# Patient Record
Sex: Female | Born: 1990
Health system: Southern US, Community
[De-identification: ages and names within clinical notes are randomized; demographics above are authoritative.]

## PROBLEM LIST (undated history)

## (undated) ENCOUNTER — Inpatient Hospital Stay (HOSPITAL_COMMUNITY): Payer: Self-pay

## (undated) DIAGNOSIS — O039 Complete or unspecified spontaneous abortion without complication: Secondary | ICD-10-CM

## (undated) DIAGNOSIS — J45909 Unspecified asthma, uncomplicated: Secondary | ICD-10-CM

## (undated) DIAGNOSIS — O364XX0 Maternal care for intrauterine death, not applicable or unspecified: Secondary | ICD-10-CM

## (undated) DIAGNOSIS — F32A Depression, unspecified: Secondary | ICD-10-CM

## (undated) DIAGNOSIS — F329 Major depressive disorder, single episode, unspecified: Secondary | ICD-10-CM

## (undated) DIAGNOSIS — F419 Anxiety disorder, unspecified: Secondary | ICD-10-CM

## (undated) HISTORY — DX: Complete or unspecified spontaneous abortion without complication: O03.9

## (undated) HISTORY — DX: Unspecified asthma, uncomplicated: J45.909

## (undated) HISTORY — DX: Anxiety disorder, unspecified: F41.9

## (undated) HISTORY — DX: Depression, unspecified: F32.A

---
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
## 2011-07-28 ENCOUNTER — Emergency Department: Payer: Self-pay | Admitting: Unknown Physician Specialty

## 2011-07-28 LAB — CBC
MCH: 28 pg (ref 26.0–34.0)
Platelet: 285 10*3/uL (ref 150–440)
RBC: 4.58 10*6/uL (ref 3.80–5.20)
RDW: 13.3 % (ref 11.5–14.5)

## 2011-07-28 LAB — URINALYSIS, COMPLETE
Bilirubin,UR: NEGATIVE
Glucose,UR: NEGATIVE mg/dL (ref 0–75)
Ph: 5 (ref 4.5–8.0)
RBC,UR: 2 /HPF (ref 0–5)
Squamous Epithelial: 5
WBC UR: 3 /HPF (ref 0–5)

## 2011-07-28 LAB — COMPREHENSIVE METABOLIC PANEL
Albumin: 4.3 g/dL (ref 3.4–5.0)
Alkaline Phosphatase: 65 U/L (ref 50–136)
BUN: 11 mg/dL (ref 7–18)
Calcium, Total: 9.1 mg/dL (ref 8.5–10.1)
Chloride: 107 mmol/L (ref 98–107)
Co2: 22 mmol/L (ref 21–32)
Glucose: 76 mg/dL (ref 65–99)
Osmolality: 272 (ref 275–301)
SGOT(AST): 24 U/L (ref 15–37)

## 2011-07-28 LAB — HCG, QUANTITATIVE, PREGNANCY: Beta Hcg, Quant.: 58011 m[IU]/mL — ABNORMAL HIGH

## 2011-09-06 LAB — OB RESULTS CONSOLE GC/CHLAMYDIA: Chlamydia: NEGATIVE

## 2011-09-06 LAB — OB RESULTS CONSOLE RUBELLA ANTIBODY, IGM: Rubella: IMMUNE

## 2011-09-06 LAB — OB RESULTS CONSOLE HEPATITIS B SURFACE ANTIGEN: Hepatitis B Surface Ag: NEGATIVE

## 2011-09-06 LAB — OB RESULTS CONSOLE RPR
RPR: NONREACTIVE
RPR: NONREACTIVE

## 2011-09-06 LAB — OB RESULTS CONSOLE ANTIBODY SCREEN: Antibody Screen: NEGATIVE

## 2011-09-06 LAB — OB RESULTS CONSOLE HIV ANTIBODY (ROUTINE TESTING): HIV: NONREACTIVE

## 2012-03-04 ENCOUNTER — Encounter (HOSPITAL_COMMUNITY): Payer: Self-pay | Admitting: *Deleted

## 2012-03-04 ENCOUNTER — Inpatient Hospital Stay (HOSPITAL_COMMUNITY)
Admission: AD | Admit: 2012-03-04 | Discharge: 2012-03-04 | Disposition: A | Discharge status: Home / Self Care | Payer: Medicaid Other | Source: Ambulatory Visit | Attending: Obstetrics and Gynecology | Admitting: Obstetrics and Gynecology

## 2012-03-04 ENCOUNTER — Inpatient Hospital Stay (HOSPITAL_COMMUNITY): Payer: Medicaid Other

## 2012-03-04 DIAGNOSIS — O479 False labor, unspecified: Secondary | ICD-10-CM | POA: Insufficient documentation

## 2012-03-04 DIAGNOSIS — O368190 Decreased fetal movements, unspecified trimester, not applicable or unspecified: Secondary | ICD-10-CM | POA: Insufficient documentation

## 2012-03-04 LAB — URINALYSIS, ROUTINE W REFLEX MICROSCOPIC
Bilirubin Urine: NEGATIVE
Ketones, ur: NEGATIVE mg/dL
Leukocytes, UA: NEGATIVE
Nitrite: NEGATIVE
Protein, ur: NEGATIVE mg/dL
pH: 6.5 (ref 5.0–8.0)

## 2012-03-04 LAB — URINE MICROSCOPIC-ADD ON

## 2012-03-04 MED ORDER — ZOLPIDEM TARTRATE 5 MG PO TABS
5.0000 mg | ORAL_TABLET | Freq: Once | ORAL | Status: AC
  Administered 2012-03-04: 5 mg via ORAL
  Filled 2012-03-04: qty 1

## 2012-03-04 MED ORDER — ZOLPIDEM TARTRATE 5 MG PO TABS: 10.0000 mg | ORAL_TABLET | Freq: Once | ORAL | Status: DC

## 2012-03-04 NOTE — MAU Note (Signed)
Pt woke up this am having U/C about 0830 and unable to time frequency.  Pt states she felt little fetal movement and hasn't felt any fetal movement since 1430 when U/C's went away.  No vaginal bleeding or ROM.

## 2012-03-04 NOTE — MAU Provider Note (Signed)
Shantel P Apple is a G43P0  22 y.o. female @ [redacted]w[redacted]d gestation who presents to MAU with contractions. The contractions started at 08:30 and have continued. She also reports decreased fetal movement since this morning. She denies vaginal bleeding or leaking of fluid.   BP 123/68  Pulse 119  Temp 97.6 F (36.4 C) (Oral)  Resp 18  Ht 5\' 7"  (1.702 m)  Wt 217 lb (98.431 kg)  BMI 33.99 kg/m2  SpO2 100%   EFM: baseline 145, moderate variability, not reactive, contractions every 2 to 5 minutes  Dr. Henderson Cloud notified.  Patient sent for [facility]  Medical screening exam complete and patient stable to continue care with Dr. Henderson Cloud.  RN to call Dr. Henderson Cloud with u/s results.

## 2012-03-18 ENCOUNTER — Encounter (HOSPITAL_COMMUNITY): Payer: Self-pay | Admitting: *Deleted

## 2012-03-18 ENCOUNTER — Telehealth (HOSPITAL_COMMUNITY): Payer: Self-pay | Admitting: *Deleted

## 2012-03-18 NOTE — Telephone Encounter (Signed)
Preadmission screen  

## 2012-03-19 ENCOUNTER — Encounter (HOSPITAL_COMMUNITY): Payer: Self-pay | Admitting: *Deleted

## 2012-03-19 ENCOUNTER — Telehealth (HOSPITAL_COMMUNITY): Payer: Self-pay | Admitting: *Deleted

## 2012-03-19 NOTE — Telephone Encounter (Signed)
Preadmission screen  

## 2012-03-23 ENCOUNTER — Inpatient Hospital Stay (HOSPITAL_COMMUNITY)
Admission: RE | Admit: 2012-03-23 | Discharge: 2012-03-27 | DRG: 765 | Disposition: A | Discharge status: Home / Self Care | Payer: Medicaid Other | Source: Ambulatory Visit | Attending: Obstetrics and Gynecology | Admitting: Obstetrics and Gynecology

## 2012-03-23 ENCOUNTER — Encounter (HOSPITAL_COMMUNITY): Payer: Self-pay

## 2012-03-23 VITALS — BP 103/52 | HR 87 | Temp 97.7°F | Resp 18 | Ht 68.0 in | Wt 215.0 lb

## 2012-03-23 DIAGNOSIS — O3660X0 Maternal care for excessive fetal growth, unspecified trimester, not applicable or unspecified: Secondary | ICD-10-CM | POA: Diagnosis present

## 2012-03-23 DIAGNOSIS — Z3480 Encounter for supervision of other normal pregnancy, unspecified trimester: Secondary | ICD-10-CM

## 2012-03-23 DIAGNOSIS — O99892 Other specified diseases and conditions complicating childbirth: Secondary | ICD-10-CM | POA: Diagnosis not present

## 2012-03-23 DIAGNOSIS — R0902 Hypoxemia: Secondary | ICD-10-CM | POA: Diagnosis not present

## 2012-03-23 DIAGNOSIS — R000 Tachycardia, unspecified: Secondary | ICD-10-CM | POA: Diagnosis not present

## 2012-03-23 DIAGNOSIS — O480 Post-term pregnancy: Principal | ICD-10-CM | POA: Diagnosis present

## 2012-03-23 DIAGNOSIS — O411090 Infection of amniotic sac and membranes, unspecified, unspecified trimester, not applicable or unspecified: Secondary | ICD-10-CM | POA: Diagnosis present

## 2012-03-23 LAB — CBC
Hemoglobin: 11.9 g/dL — ABNORMAL LOW (ref 12.0–15.0)
MCH: 28.7 pg (ref 26.0–34.0)
MCHC: 33.8 g/dL (ref 30.0–36.0)
Platelets: 241 10*3/uL (ref 150–400)
RBC: 4.15 MIL/uL (ref 3.87–5.11)

## 2012-03-23 MED ORDER — FLEET ENEMA 7-19 GM/118ML RE ENEM: 1.0000 | ENEMA | RECTAL | Status: DC | PRN

## 2012-03-23 MED ORDER — ACETAMINOPHEN 325 MG PO TABS
650.0000 mg | ORAL_TABLET | ORAL | Status: DC | PRN
  Filled 2012-03-23: qty 2

## 2012-03-23 MED ORDER — TERBUTALINE SULFATE 1 MG/ML IJ SOLN: 0.2500 mg | Freq: Once | INTRAMUSCULAR | Status: AC | PRN

## 2012-03-23 MED ORDER — EPHEDRINE 5 MG/ML INJ: 10.0000 mg | INTRAVENOUS | Status: DC | PRN

## 2012-03-23 MED ORDER — OXYTOCIN 40 UNITS IN LACTATED RINGERS INFUSION - SIMPLE MED: 62.5000 mL/h | INTRAVENOUS | Status: DC

## 2012-03-23 MED ORDER — FENTANYL 2.5 MCG/ML BUPIVACAINE 1/10 % EPIDURAL INFUSION (WH - ANES)
14.0000 mL/h | INTRAMUSCULAR | Status: DC
  Administered 2012-03-24: 14 mL/h via EPIDURAL
  Filled 2012-03-23 (×2): qty 125

## 2012-03-23 MED ORDER — CITRIC ACID-SODIUM CITRATE 334-500 MG/5ML PO SOLN
30.0000 mL | ORAL | Status: DC | PRN
  Administered 2012-03-24: 30 mL via ORAL
  Filled 2012-03-23 (×2): qty 15

## 2012-03-23 MED ORDER — PHENYLEPHRINE 40 MCG/ML (10ML) SYRINGE FOR IV PUSH (FOR BLOOD PRESSURE SUPPORT): 80.0000 ug | PREFILLED_SYRINGE | INTRAVENOUS | Status: DC | PRN

## 2012-03-23 MED ORDER — DIPHENHYDRAMINE HCL 50 MG/ML IJ SOLN: 12.5000 mg | INTRAMUSCULAR | Status: DC | PRN

## 2012-03-23 MED ORDER — EPHEDRINE 5 MG/ML INJ
10.0000 mg | INTRAVENOUS | Status: DC | PRN
  Filled 2012-03-23: qty 4

## 2012-03-23 MED ORDER — ONDANSETRON HCL 4 MG/2ML IJ SOLN: 4.0000 mg | Freq: Four times a day (QID) | INTRAMUSCULAR | Status: DC | PRN

## 2012-03-23 MED ORDER — IBUPROFEN 600 MG PO TABS: 600.0000 mg | ORAL_TABLET | Freq: Four times a day (QID) | ORAL | Status: DC | PRN

## 2012-03-23 MED ORDER — PHENYLEPHRINE 40 MCG/ML (10ML) SYRINGE FOR IV PUSH (FOR BLOOD PRESSURE SUPPORT)
80.0000 ug | PREFILLED_SYRINGE | INTRAVENOUS | Status: DC | PRN
  Filled 2012-03-23: qty 5

## 2012-03-23 MED ORDER — MISOPROSTOL 25 MCG QUARTER TABLET
25.0000 ug | ORAL_TABLET | ORAL | Status: DC | PRN
  Administered 2012-03-23: 25 ug via VAGINAL
  Filled 2012-03-23: qty 0.25

## 2012-03-23 MED ORDER — LACTATED RINGERS IV SOLN
500.0000 mL | Freq: Once | INTRAVENOUS | Status: AC
  Administered 2012-03-23 – 2012-03-24 (×2): 500 mL via INTRAVENOUS

## 2012-03-23 MED ORDER — BUTORPHANOL TARTRATE 1 MG/ML IJ SOLN
1.0000 mg | INTRAMUSCULAR | Status: DC | PRN
  Administered 2012-03-23 – 2012-03-24 (×2): 1 mg via INTRAVENOUS
  Filled 2012-03-23 (×2): qty 1

## 2012-03-23 MED ORDER — OXYTOCIN BOLUS FROM INFUSION: 500.0000 mL | INTRAVENOUS | Status: DC

## 2012-03-23 MED ORDER — OXYCODONE-ACETAMINOPHEN 5-325 MG PO TABS: 1.0000 | ORAL_TABLET | ORAL | Status: DC | PRN

## 2012-03-23 MED ORDER — ZOLPIDEM TARTRATE 5 MG PO TABS: 5.0000 mg | ORAL_TABLET | Freq: Every evening | ORAL | Status: DC | PRN

## 2012-03-23 MED ORDER — LIDOCAINE HCL (PF) 1 % IJ SOLN: 30.0000 mL | INTRAMUSCULAR | Status: DC | PRN

## 2012-03-23 MED ORDER — OXYTOCIN 40 UNITS IN LACTATED RINGERS INFUSION - SIMPLE MED
1.0000 m[IU]/min | INTRAVENOUS | Status: DC
  Administered 2012-03-24: 2 m[IU]/min via INTRAVENOUS
  Filled 2012-03-23: qty 1000

## 2012-03-23 MED ORDER — LACTATED RINGERS IV SOLN
INTRAVENOUS | Status: DC
  Administered 2012-03-23 – 2012-03-24 (×3): via INTRAVENOUS

## 2012-03-23 MED ORDER — LACTATED RINGERS IV SOLN: 500.0000 mL | INTRAVENOUS | Status: DC | PRN

## 2012-03-23 NOTE — H&P (Signed)
22 y.o. G1P0  Estimated Date of Delivery: 03/17/12 admitted at [redacted] weeks gestation for induction.  Prenatal Transfer Tool  Maternal Diabetes: No Genetic Screening: Normal Maternal Ultrasounds/Referrals: Normal Fetal Ultrasounds or other Referrals:  None Maternal Substance Abuse:  No Significant Maternal Medications:  None Significant Maternal Lab Results: None Other Significant Pregnancy Complications:  Post dates  Afebrile, VSS Heart and Lungs: No active disease Abdomen: soft, gravid, EFW AGA. Cervical exam:  1/60 -2 (nurse exam)  Impression: Post date pregnancy  Plan:  Cytotec/pitocin induction

## 2012-03-24 ENCOUNTER — Encounter (HOSPITAL_COMMUNITY): Payer: Self-pay | Admitting: Anesthesiology

## 2012-03-24 ENCOUNTER — Encounter (HOSPITAL_COMMUNITY): Payer: Self-pay

## 2012-03-24 ENCOUNTER — Inpatient Hospital Stay (HOSPITAL_COMMUNITY): Payer: Medicaid Other | Admitting: Anesthesiology

## 2012-03-24 ENCOUNTER — Encounter (HOSPITAL_COMMUNITY)
Admission: RE | Disposition: A | Discharge status: Home / Self Care | Payer: Self-pay | Source: Ambulatory Visit | Attending: Obstetrics and Gynecology

## 2012-03-24 LAB — ABO/RH: ABO/RH(D): O POS

## 2012-03-24 LAB — FIBRINOGEN: Fibrinogen: 620 mg/dL — ABNORMAL HIGH (ref 204–475)

## 2012-03-24 LAB — RPR: RPR Ser Ql: NONREACTIVE

## 2012-03-24 LAB — APTT: aPTT: 33 seconds (ref 24–37)

## 2012-03-24 LAB — TYPE AND SCREEN: Antibody Screen: NEGATIVE

## 2012-03-24 SURGERY — Surgical Case
Anesthesia: Epidural | Site: Abdomen | Wound class: Clean Contaminated

## 2012-03-24 MED ORDER — LACTATED RINGERS IV SOLN
INTRAVENOUS | Status: DC | PRN
  Administered 2012-03-24: 16:00:00 via INTRAVENOUS

## 2012-03-24 MED ORDER — LACTATED RINGERS IV SOLN
INTRAVENOUS | Status: DC
  Administered 2012-03-24: 23:00:00 via INTRAVENOUS

## 2012-03-24 MED ORDER — ONDANSETRON HCL 4 MG/2ML IJ SOLN: 4.0000 mg | INTRAMUSCULAR | Status: DC | PRN

## 2012-03-24 MED ORDER — DIPHENHYDRAMINE HCL 25 MG PO CAPS: 25.0000 mg | ORAL_CAPSULE | Freq: Four times a day (QID) | ORAL | Status: DC | PRN

## 2012-03-24 MED ORDER — SENNOSIDES-DOCUSATE SODIUM 8.6-50 MG PO TABS
2.0000 | ORAL_TABLET | Freq: Every day | ORAL | Status: DC
  Administered 2012-03-24 – 2012-03-25 (×2): 2 via ORAL

## 2012-03-24 MED ORDER — DIPHENHYDRAMINE HCL 50 MG/ML IJ SOLN
INTRAMUSCULAR | Status: AC
  Filled 2012-03-24: qty 1

## 2012-03-24 MED ORDER — SODIUM BICARBONATE 8.4 % IV SOLN
INTRAVENOUS | Status: DC | PRN
  Administered 2012-03-24: 5 mL via EPIDURAL

## 2012-03-24 MED ORDER — SODIUM BICARBONATE 8.4 % IV SOLN
INTRAVENOUS | Status: AC
  Filled 2012-03-24: qty 50

## 2012-03-24 MED ORDER — OXYCODONE-ACETAMINOPHEN 5-325 MG PO TABS
1.0000 | ORAL_TABLET | ORAL | Status: DC | PRN
  Administered 2012-03-25: 1 via ORAL
  Administered 2012-03-26 (×3): 2 via ORAL
  Filled 2012-03-24 (×2): qty 2
  Filled 2012-03-24: qty 1
  Filled 2012-03-24: qty 2

## 2012-03-24 MED ORDER — ONDANSETRON HCL 4 MG/2ML IJ SOLN
INTRAMUSCULAR | Status: AC
  Filled 2012-03-24: qty 2

## 2012-03-24 MED ORDER — DIBUCAINE 1 % RE OINT: 1.0000 "application " | TOPICAL_OINTMENT | RECTAL | Status: DC | PRN

## 2012-03-24 MED ORDER — MORPHINE SULFATE 0.5 MG/ML IJ SOLN
INTRAMUSCULAR | Status: AC
  Filled 2012-03-24: qty 10

## 2012-03-24 MED ORDER — METHYLERGONOVINE MALEATE 0.2 MG/ML IJ SOLN
INTRAMUSCULAR | Status: AC
  Filled 2012-03-24: qty 1

## 2012-03-24 MED ORDER — CEFAZOLIN SODIUM-DEXTROSE 2-3 GM-% IV SOLR: 2.0000 g | Freq: Once | INTRAVENOUS | Status: DC

## 2012-03-24 MED ORDER — IBUPROFEN 600 MG PO TABS
600.0000 mg | ORAL_TABLET | Freq: Four times a day (QID) | ORAL | Status: DC
  Administered 2012-03-25 – 2012-03-27 (×10): 600 mg via ORAL
  Filled 2012-03-24 (×11): qty 1

## 2012-03-24 MED ORDER — ONDANSETRON HCL 4 MG/2ML IJ SOLN
INTRAMUSCULAR | Status: DC | PRN
  Administered 2012-03-24: 4 mg via INTRAVENOUS

## 2012-03-24 MED ORDER — METHYLERGONOVINE MALEATE 0.2 MG/ML IJ SOLN
INTRAMUSCULAR | Status: DC | PRN
  Administered 2012-03-24: 0.2 mg via INTRAMUSCULAR

## 2012-03-24 MED ORDER — OXYTOCIN 40 UNITS IN LACTATED RINGERS INFUSION - SIMPLE MED
INTRAVENOUS | Status: DC | PRN
  Administered 2012-03-24: 40 [IU] via INTRAVENOUS

## 2012-03-24 MED ORDER — PRENATAL MULTIVITAMIN CH
1.0000 | ORAL_TABLET | Freq: Every day | ORAL | Status: DC
  Administered 2012-03-25 – 2012-03-27 (×3): 1 via ORAL
  Filled 2012-03-24 (×3): qty 1

## 2012-03-24 MED ORDER — OXYTOCIN 10 UNIT/ML IJ SOLN
INTRAMUSCULAR | Status: AC
  Filled 2012-03-24: qty 4

## 2012-03-24 MED ORDER — LACTATED RINGERS IV SOLN
INTRAVENOUS | Status: DC | PRN
  Administered 2012-03-24 (×2): via INTRAVENOUS

## 2012-03-24 MED ORDER — CEFAZOLIN SODIUM-DEXTROSE 2-3 GM-% IV SOLR
INTRAVENOUS | Status: AC
  Filled 2012-03-24: qty 50

## 2012-03-24 MED ORDER — MENTHOL 3 MG MT LOZG: 1.0000 | LOZENGE | OROMUCOSAL | Status: DC | PRN

## 2012-03-24 MED ORDER — ZOLPIDEM TARTRATE 5 MG PO TABS: 5.0000 mg | ORAL_TABLET | Freq: Every evening | ORAL | Status: DC | PRN

## 2012-03-24 MED ORDER — SIMETHICONE 80 MG PO CHEW: 80.0000 mg | CHEWABLE_TABLET | ORAL | Status: DC | PRN

## 2012-03-24 MED ORDER — MORPHINE SULFATE (PF) 0.5 MG/ML IJ SOLN
INTRAMUSCULAR | Status: DC | PRN
  Administered 2012-03-24: 1 mg via INTRAVENOUS
  Administered 2012-03-24: 4 mg via EPIDURAL

## 2012-03-24 MED ORDER — SIMETHICONE 80 MG PO CHEW
80.0000 mg | CHEWABLE_TABLET | Freq: Three times a day (TID) | ORAL | Status: DC
  Administered 2012-03-24 – 2012-03-27 (×9): 80 mg via ORAL

## 2012-03-24 MED ORDER — TETANUS-DIPHTH-ACELL PERTUSSIS 5-2.5-18.5 LF-MCG/0.5 IM SUSP
0.5000 mL | Freq: Once | INTRAMUSCULAR | Status: AC
  Administered 2012-03-25: 0.5 mL via INTRAMUSCULAR
  Filled 2012-03-24: qty 0.5

## 2012-03-24 MED ORDER — CEFAZOLIN SODIUM-DEXTROSE 2-3 GM-% IV SOLR
INTRAVENOUS | Status: DC | PRN
  Administered 2012-03-24: 2 g via INTRAVENOUS

## 2012-03-24 MED ORDER — ONDANSETRON HCL 4 MG PO TABS: 4.0000 mg | ORAL_TABLET | ORAL | Status: DC | PRN

## 2012-03-24 MED ORDER — WITCH HAZEL-GLYCERIN EX PADS: 1.0000 "application " | MEDICATED_PAD | CUTANEOUS | Status: DC | PRN

## 2012-03-24 MED ORDER — LANOLIN HYDROUS EX OINT: 1.0000 "application " | TOPICAL_OINTMENT | CUTANEOUS | Status: DC | PRN

## 2012-03-24 MED ORDER — OXYTOCIN 40 UNITS IN LACTATED RINGERS INFUSION - SIMPLE MED: 62.5000 mL/h | INTRAVENOUS | Status: AC

## 2012-03-24 MED ORDER — LIDOCAINE-EPINEPHRINE (PF) 2 %-1:200000 IJ SOLN
INTRAMUSCULAR | Status: AC
  Filled 2012-03-24: qty 20

## 2012-03-24 SURGICAL SUPPLY — 34 items
CLOTH BEACON ORANGE TIMEOUT ST (SAFETY) ×2 IMPLANT
CONTAINER PREFILL 10% NBF 15ML (MISCELLANEOUS) IMPLANT
DRAPE LG THREE QUARTER DISP (DRAPES) ×2 IMPLANT
DRSG OPSITE POSTOP 4X10 (GAUZE/BANDAGES/DRESSINGS) ×2 IMPLANT
DURAPREP 26ML APPLICATOR (WOUND CARE) ×2 IMPLANT
ELECT REM PT RETURN 9FT ADLT (ELECTROSURGICAL) ×2
ELECTRODE REM PT RTRN 9FT ADLT (ELECTROSURGICAL) ×1 IMPLANT
EXTRACTOR VACUUM M CUP 4 TUBE (SUCTIONS) ×2 IMPLANT
GLOVE ECLIPSE 6.0 STRL STRAW (GLOVE) ×4 IMPLANT
GLOVE ECLIPSE 6.5 STRL STRAW (GLOVE) ×4 IMPLANT
GLOVE NEODERM STER SZ 7 (GLOVE) ×4 IMPLANT
GOWN PREVENTION PLUS LG XLONG (DISPOSABLE) ×6 IMPLANT
KIT ABG SYR 3ML LUER SLIP (SYRINGE) IMPLANT
NEEDLE HYPO 25X5/8 SAFETYGLIDE (NEEDLE) ×2 IMPLANT
NS IRRIG 1000ML POUR BTL (IV SOLUTION) ×4 IMPLANT
PACK C SECTION WH (CUSTOM PROCEDURE TRAY) ×2 IMPLANT
PAD OB MATERNITY 4.3X12.25 (PERSONAL CARE ITEMS) ×2 IMPLANT
RTRCTR C-SECT PINK 25CM LRG (MISCELLANEOUS) ×2 IMPLANT
SLEEVE SCD COMPRESS KNEE MED (MISCELLANEOUS) IMPLANT
STAPLER VISISTAT 35W (STAPLE) ×2 IMPLANT
SUT PLAIN 0 NONE (SUTURE) IMPLANT
SUT VIC AB 0 CT1 27 (SUTURE) ×5
SUT VIC AB 0 CT1 27XBRD ANBCTR (SUTURE) ×5 IMPLANT
SUT VIC AB 1 CTX 36 (SUTURE) ×4
SUT VIC AB 1 CTX36XBRD ANBCTRL (SUTURE) ×4 IMPLANT
SUT VIC AB 3-0 CT1 27 (SUTURE) ×2
SUT VIC AB 3-0 CT1 TAPERPNT 27 (SUTURE) ×2 IMPLANT
SUT VIC AB 3-0 PS2 18 (SUTURE)
SUT VIC AB 3-0 PS2 18XBRD (SUTURE) IMPLANT
SUT VIC AB 3-0 SH 27 (SUTURE)
SUT VIC AB 3-0 SH 27X BRD (SUTURE) IMPLANT
TOWEL OR 17X24 6PK STRL BLUE (TOWEL DISPOSABLE) ×6 IMPLANT
TRAY FOLEY CATH 14FR (SET/KITS/TRAYS/PACK) ×2 IMPLANT
WATER STERILE IRR 1000ML POUR (IV SOLUTION) ×2 IMPLANT

## 2012-03-24 NOTE — Progress Notes (Signed)
Platelet count called to dr Jean Rosenthal okay to pull epidural catheter.

## 2012-03-24 NOTE — Op Note (Addendum)
Patient Name: Jill Sandoval MRN: [id number]  Date of Surgery: 03/24/2012    PREOPERATIVE DIAGNOSIS: Maternal hypoxia and tachycardia, fetal tachycardia  POSTOPERATIVE DIAGNOSIS: Same, possible chorioamnionitis   PROCEDURE: Low transverse cesarean section  SURGEON: Caralyn Guile. Arlyce Dice M.D.  ASSISTANT: Antionette Char, M.D.  ANESTHESIA: Epidural  ESTIMATED BLOOD LOSS: 1000 ml  FINDINGS: 8 lb 9 oz Female, Apgar 5,9;  Meconium and foul smelling fluid consistent with chorioamnionitis.  Normal uterus and adnexa.   INDICATIONS: Patient developed an acute episode of sustained maternal tachycardia and mild hypoxia.  Fetal tachycardia developed as well.  Concern was raised about a possible small amniotic fluid embolus.  In view of unexplained and concerning maternal and fetal condition and that the patient was remote from delivery, the decision was made to proceed with cesarean delivery.  PROCEDURE IN DETAIL: The patient was taken to the operating room and the epidural that had been placed in labor was re injected for surgical anesthesia.  She was then placed in the supine position with left lateral displacement of the uterus. The abdomen was prepped and draped in a sterile fashion and the bladder was catheterized.  A low transverse abdominal incision was made and carried down to the fascia. The fascia was opened transversely and the rectus sheath was dissected from the underlying rectus muscle. The rectus midline was identified and opened by sharp and blunt dissection. The peritoneum was opened. An Alexis retractor was placed and the lower uterine segment was identified, entered transversely by careful sharp dissection, and extended bluntly.  A foul odor consistent with chorio was noted.  The infant was delivered with the aid of a vacuum. The placenta was delivered and sent to pathology.  The uterus was bluntly curettaged. The lower segment was closed with running interlocking Vicryl 1 suture.  A  second imbricating Vicryl 1 suture line was placed. The peritoneum and rectus muscle were closed in the midline with running 3-0 Vicryl suture. The fascia was closed with running 0 Vicryl suture and the skin was closed with staples. All sponge and instrument counts were correct.  The patient tolerated the procedure well and left the operating room in good condition.

## 2012-03-24 NOTE — Anesthesia Postprocedure Evaluation (Signed)
  Anesthesia Post Note  Patient: Jill Sandoval  Procedure(s) Performed: Procedure(s) (LRB): CESAREAN SECTION (N/A)  Anesthesia type: Epidural  Patient location: PACU  Post pain: Pain level controlled  Post assessment: Post-op Vital signs reviewed  Last Vitals:  Filed Vitals:   03/24/12 1745  BP: 116/62  Pulse: 101  Temp: 37 C  Resp: 23    Post vital signs: Reviewed  Level of consciousness: awake  Complications: No apparent anesthesia complications

## 2012-03-24 NOTE — Anesthesia Procedure Notes (Signed)

## 2012-03-24 NOTE — Progress Notes (Signed)
Patient developed increased pain, tachycardia, and mild hypoxia.  Baby has developed tachycardia as well.  Cervix is 5.5.  I have a concern that this may represent a small amniotic fluid embolus.  Because of the uncertain maternal status, the fetal tachycardia, possibility of a large baby and remote from full dilation, I have decided to recommend urgent cesarean delivery.  The patient agrees.

## 2012-03-24 NOTE — Anesthesia Preprocedure Evaluation (Signed)
Anesthesia Evaluation  Patient identified by MRN, date of birth, ID band Patient awake    Reviewed: Allergy & Precautions, H&P , Patient's Chart, lab work & pertinent test results  Airway Mallampati: II  TM Distance: >3 FB Neck ROM: full    Dental  (+) Teeth Intact   Pulmonary asthma ,    breath sounds clear to auscultation       Cardiovascular  Rhythm:regular Rate:Normal     Neuro/Psych    GI/Hepatic   Endo/Other    Renal/GU      Musculoskeletal   Abdominal   Peds  Hematology   Anesthesia Other Findings Rare inhaler use       Reproductive/Obstetrics (+) Pregnancy                             Anesthesia Physical Anesthesia Plan  ASA: II  Anesthesia Plan: Epidural   Post-op Pain Management:    Induction:   Airway Management Planned:   Additional Equipment:   Intra-op Plan:   Post-operative Plan:   Informed Consent: I have reviewed the patients History and Physical, chart, labs and discussed the procedure including the risks, benefits and alternatives for the proposed anesthesia with the patient or authorized representative who has indicated his/her understanding and acceptance.   Dental Advisory Given  Plan Discussed with:   Anesthesia Plan Comments: (Labs checked- platelets confirmed with RN in room. Fetal heart tracing, per RN, reported to be stable enough for sitting procedure. Discussed epidural, and patient consents to the procedure:  included risk of possible headache,backache, failed block, allergic reaction, and nerve injury. This patient was asked if she had any questions or concerns before the procedure started.)        Anesthesia Quick Evaluation  

## 2012-03-24 NOTE — Transfer of Care (Signed)
Immediate Anesthesia Transfer of Care Note  Patient: Jill Sandoval  Procedure(s) Performed: Procedure(s): CESAREAN SECTION (N/A)  Patient Location: PACU  Anesthesia Type:Epidural  Level of Consciousness: awake, alert , oriented and patient cooperative  Airway & Oxygen Therapy: Patient Spontanous Breathing  Post-op Assessment: Report given to PACU RN, Post -op Vital signs reviewed and stable and Patient moving all extremities X 4  Post vital signs: Reviewed and stable  Complications: No apparent anesthesia complications

## 2012-03-24 NOTE — Consult Note (Signed)
Neonatology Note:  Attendance at C-section:  I was asked to attend this urgent primary C/S at term due to fetal tachycardia and mother being Rodelo of breath/in resp distress. The mother is a G1P0 O pos, GBS neg with an otherwise uncomplicated pregnancy. ROM 8 hours prior to delivery, fluid with light meconium. Mother was afebrile during labor and received no antibiotics. At delivery, infant was floppy, blue, and poorly perfused, but breathed on his own. He was very foul-smelling. His HR was about 200 and his breathing was somewhat labored, with audible secretions. We bulb suctioned, then used the DeLee and got 20+ ml of purulent, light green mucous from the stomach and throat. We placed a pulse oximeter at about 3-4 minuites of life and it was 45%, so BBO2 was started. The baby's color imrpoved and his perfusion gradually improved, also. We did CPT and DeLee suctioned again for another 4 ml of fluid. The baby was improved, but when we took him to room air (2 tries), he desaturated quickly into the 70s, so BBO2 had to be resumed. The mother viewed the baby briefly, then he was transported to the NICU on BBO2 for further treatment. Ap 5/9. Have asked OB to send placenta for pathology.  Breezie Micucci, MD  

## 2012-03-25 ENCOUNTER — Encounter (HOSPITAL_COMMUNITY): Payer: Self-pay

## 2012-03-25 LAB — CBC
HCT: 26.4 % — ABNORMAL LOW (ref 36.0–46.0)
MCH: 28.4 pg (ref 26.0–34.0)
MCHC: 33.3 g/dL (ref 30.0–36.0)
RDW: 15.1 % (ref 11.5–15.5)

## 2012-03-25 MED ORDER — PNEUMOCOCCAL VAC POLYVALENT 25 MCG/0.5ML IJ INJ
0.5000 mL | INJECTION | INTRAMUSCULAR | Status: AC
  Administered 2012-03-26: 0.5 mL via INTRAMUSCULAR
  Filled 2012-03-25: qty 0.5

## 2012-03-25 NOTE — Anesthesia Postprocedure Evaluation (Signed)
Anesthesia Post Note  Patient: Jill Sandoval  Procedure(s) Performed: Procedure(s) (LRB): CESAREAN SECTION (N/A)  Anesthesia type: Epidural  Patient location: Mother/Baby  Post pain: Pain level controlled  Post assessment: Post-op Vital signs reviewed  Last Vitals:  Filed Vitals:   03/25/12 0502  BP: 94/64  Pulse: 92  Temp:   Resp: 18    Post vital signs: Reviewed  Level of consciousness:alert  Complications: No apparent anesthesia complications

## 2012-03-25 NOTE — Anesthesia Postprocedure Evaluation (Signed)
Anesthesia Post Note  Patient: [name] [name]   Procedure(s) Performed: CLE/C/S  Anesthesia type: Epidural  Patient location: Mother/Baby  Post pain: Pain level controlled  Post assessment: Post-op Vital signs reviewed  Last Vitals: BP 94/64  Pulse 92  Temp(Src) 36.5 C (Oral)  Resp 18  Ht 5\' 8"  (1.727 m)  Wt 215 lb (97.523 kg)  BMI 32.7 kg/m2  SpO2 97%  Post vital signs: Reviewed  Level of consciousness: awake  Complications: No apparent anesthesia complications

## 2012-03-25 NOTE — Progress Notes (Signed)
  Patient is eating, ambulating, voiding.  Pain control is good.  Filed Vitals:   03/24/12 2246 03/25/12 0049 03/25/12 0240 03/25/12 0502  BP: 108/73 103/68 103/68 94/64  Pulse:  94 86 92  Temp:   97.7 F (36.5 C)   TempSrc:   Oral   Resp:  18 18 18   Height:      Weight:      SpO2:  97% 97% 97%    lungs:   clear to auscultation cor:    RRR Abdomen:  soft, appropriate tenderness, incisions intact and without erythema or exudate ex:    no cords   Lab Results  Component Value Date   WBC 19.8* 03/25/2012   HGB 8.8* 03/25/2012   HCT 26.4* 03/25/2012   MCV 85.2 03/25/2012   PLT 184 03/25/2012    --/--/O POS, O POS (02/24 1525)/RI  A/P    Post operative day 1 from LTCS for maternal tachycardia; pt did well o/n with stable vitals.  Routine post op and postpartum care.  Expect d/c tomorrow.  Percocet for pain control.

## 2012-03-25 NOTE — Progress Notes (Signed)
Pt desires circ in office.

## 2012-03-27 MED ORDER — OXYCODONE-ACETAMINOPHEN 5-325 MG PO TABS: 1.0000 | ORAL_TABLET | ORAL | Status: DC | PRN

## 2012-03-27 NOTE — Progress Notes (Signed)
Pt  Out with family and tech  Incisional care reviewed   Ambulated out  Teaching complete

## 2012-03-27 NOTE — Progress Notes (Addendum)
CSW met with MOB and MGM briefly at baby's bedside as they have concerns about FOB.  MOB states he signed the Affidavit of Parentage, but that she has now decided she does not want him on the birth certificate.  CSW referred her to the Birth Registrar to discuss this matter.  She denies any safety concerns and states she has a great support system.  She states no other questions or needs at this time.  CSW will attempt to meet with her at a later time to complete full assessment.

## 2012-03-27 NOTE — Progress Notes (Signed)
POD#3 Pt without complaints.  Baby is doing well. VSSAF Incision is healing well. PLAN/ Will discharge.

## 2012-03-27 NOTE — Progress Notes (Signed)
I visited with Jill Sandoval and her mother, Jill Sandoval, while making rounds on the unit.  They were in good spirits and are coping well with having a baby in the NICU.  This is the first child and grandchild in the family and they are adjusting to their new roles.  Although they live about 40 minutes away, [name] said she would be able to find someone to drive her over so she can visit her son.    Please page as needs arise, [phone].  Agnes Lawrence Muneeb Veras 10:44 AM   03/27/12 1000  Clinical Encounter Type  Visited With Patient and family together  Visit Type Initial

## 2012-03-30 NOTE — Discharge Summary (Signed)
NAMEDESARAI, BARRACK NO.:  [id number]  MEDICAL RECORD NO.:  [id number]  LOCATION:  9320                          FACILITY:  [facility]  PHYSICIAN:  Malva Limes, M.D.    DATE OF BIRTH:  1990/10/24  DATE OF ADMISSION:  03/23/2012 DATE OF DISCHARGE:  03/27/2012                              DISCHARGE SUMMARY   PRINCIPAL DISCHARGE DIAGNOSES: 1. Intrauterine pregnancy at 31 weeks' estimated gestational age. 2. Possible amniotic fluid embolism. 3. Chorioamnionitis.  PRINCIPAL PROCEDURES: 1. Primary low transverse cesarean section. 2. Induction.  HISTORY OF PRESENT ILLNESS:  [name] is a 22 year old, white female, G1, P1, at 17 weeks' estimated gestational age who was admitted for a Cytotec induction secondary to a post term pregnancy.  On admission, the patient's cervix was 60% effaced and 1 cm dilated.  She was given Cytotec on the evening of admission.  The following morning, Pitocin induction was begun.  The patient progressed to 5.5 cm, at which time, the baby developed tachycardia.  The patient also complained of increased pain, tachycardia, and had mild hypoxia.  The attending physicians felt that these changes may represent a small amniotic fluid embolism.  In light of this, she was taken to the operative suite for a primary cesarean section.  During this cesarean section, the foul odor was noted in the uterine cavity, along with meconium.  This was felt to possibly represent chorioamnionitis.  The patient delivered 1 live viable white female infant weighing 8 pounds 9 ounces, the Apgars were 5 at one minute and 9 at five minutes.  For complete description of the procedure, please see the dictated operative note.  The baby was taken care of after delivery via NICU.  Baby was transferred to the NICU for care.  The patient's postop course was benign.  The patient was discharged to home on postoperative day #3, the patient remained afebrile.  Her incision at the  time of discharge was healing well.  Her uterus was nontender.  She appeared to be tolerating her diet.  She did have flatus and was ambulating without difficulty.  Her pain control was adequate.  The patient was discharged to home on postop day #3.  She was sent home with Percocet and Motrin to take as needed.  She will follow up in the office in 2 weeks.          ______________________________ Malva Limes, M.D.     MA/MEDQ  D:  03/30/2012  T:  03/30/2012  Job:  [id number]

## 2013-03-21 IMAGING — US US FETAL BPP W/O NONSTRESS
2 series · 14 of 15 positions shown · non-contrast
Comparison: none

CLINICAL DATA: Decreased fetal movement. By LMP, the patient is 38
weeks 1 day.

[Series 1: us fetal bpp w/o nonstress · non-contrast · 1 of 1 slices shown (1 of 2)]
[im 1/1]
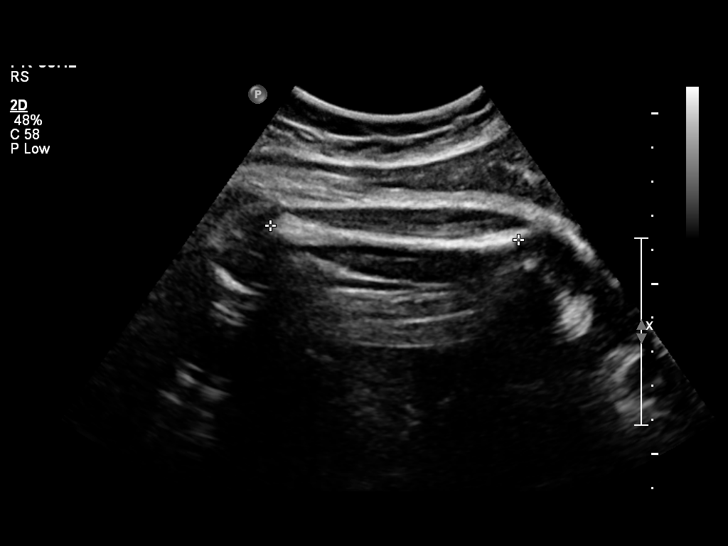

[Series 1: us fetal bpp w/o nonstress · non-contrast · 14 acquisitions, 13 frames shown (2 of 2)]
[im 1/14]
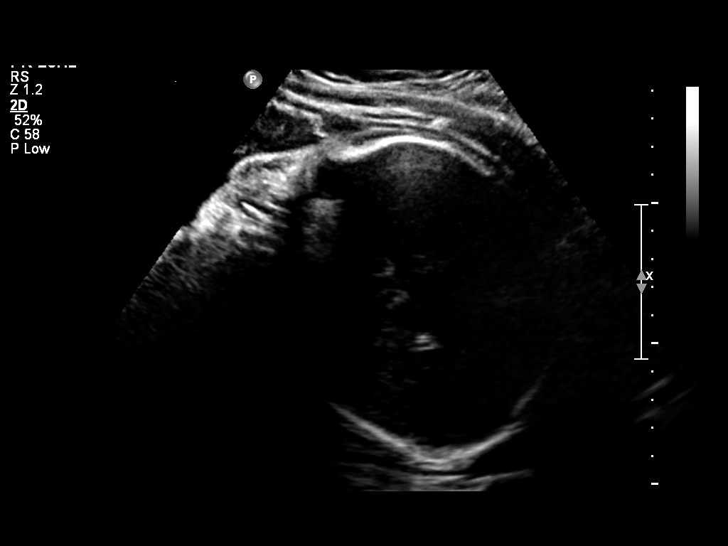
[im 2/14]
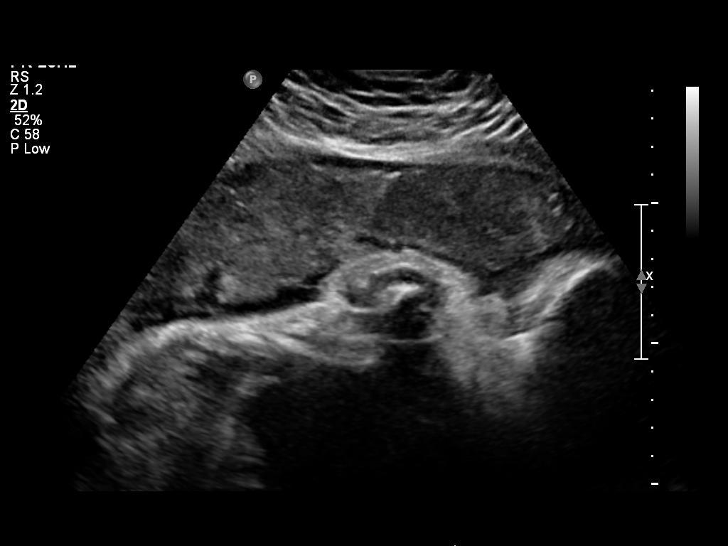
[im 3/14]
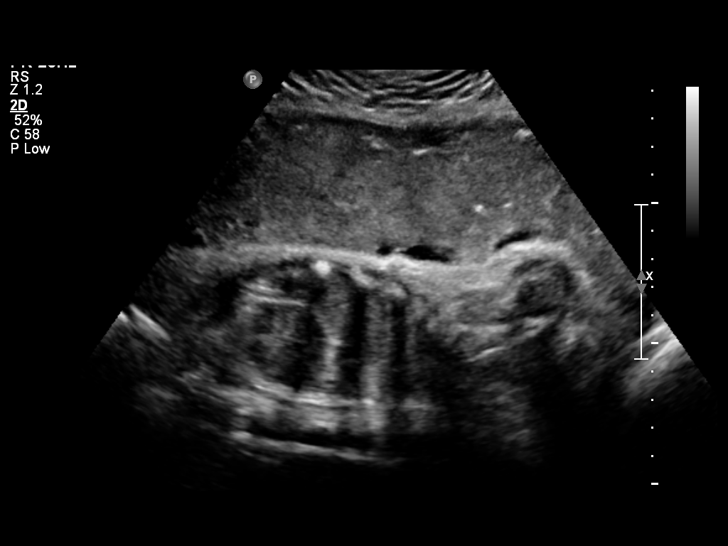
[im 4/14]
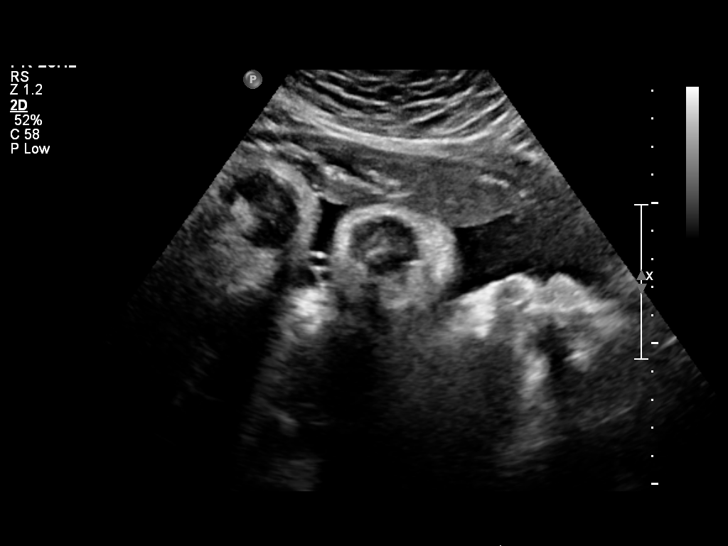
[im 5/14]
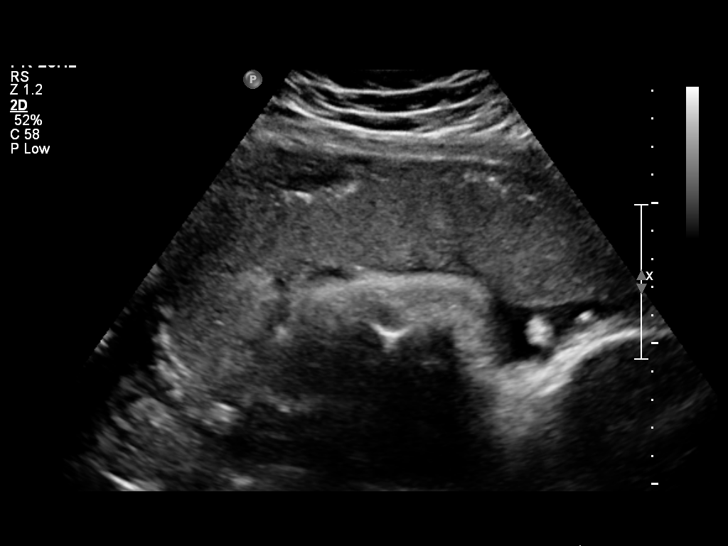
[im 6/14]
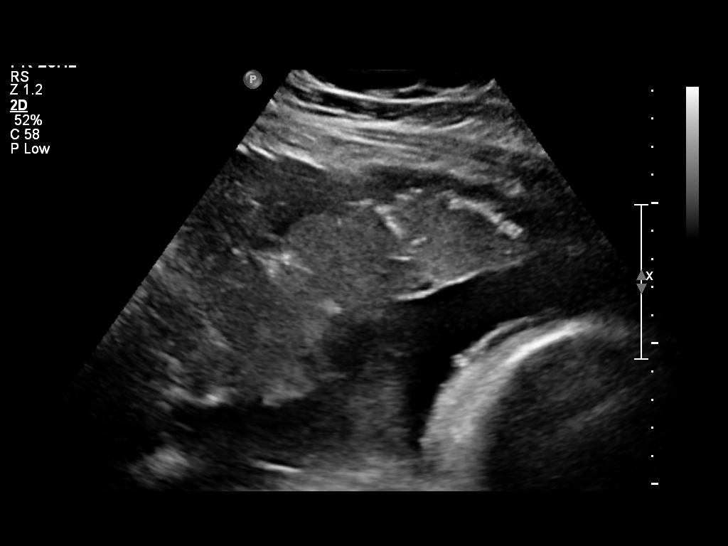
[im 8/14]
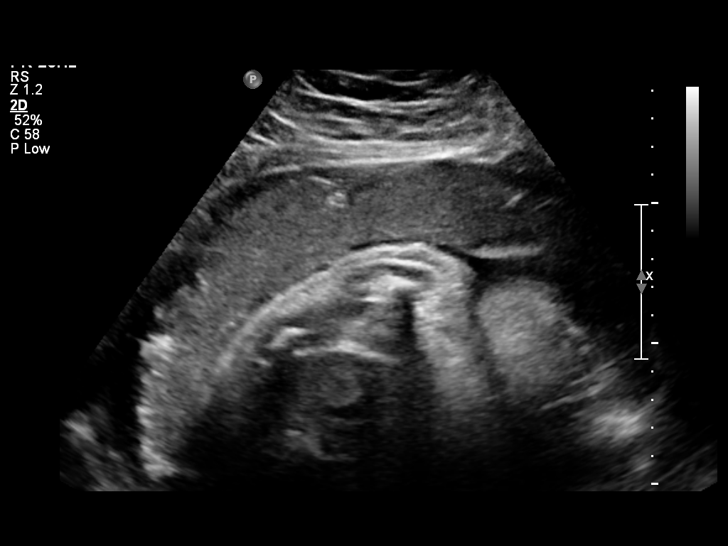
[im 9/14]
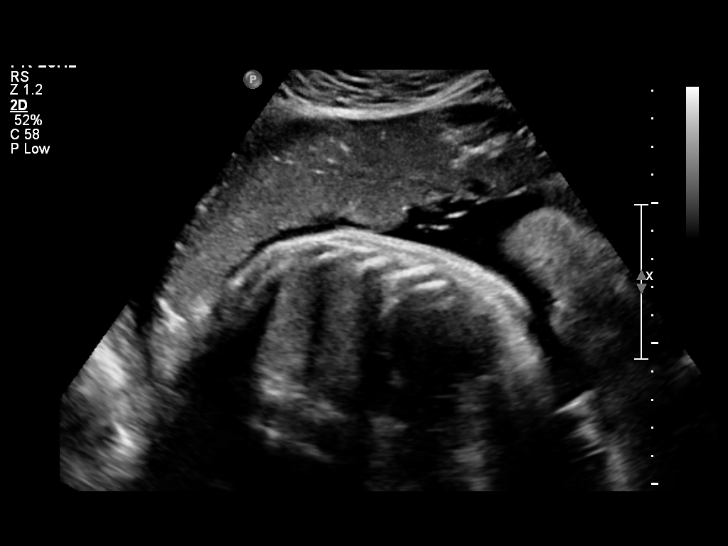
[im 10/14]
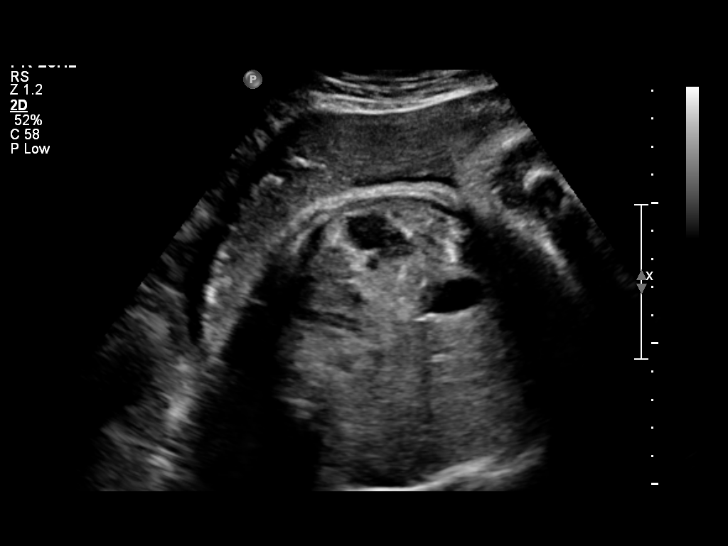
[im 11/14]
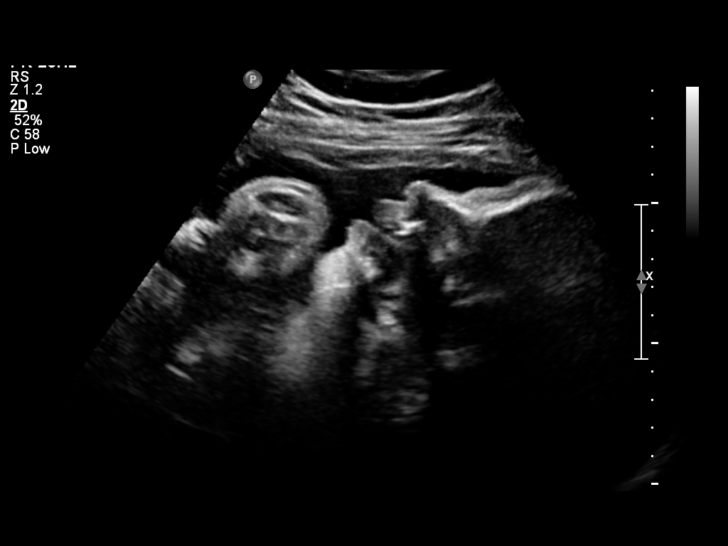
[im 12/14]
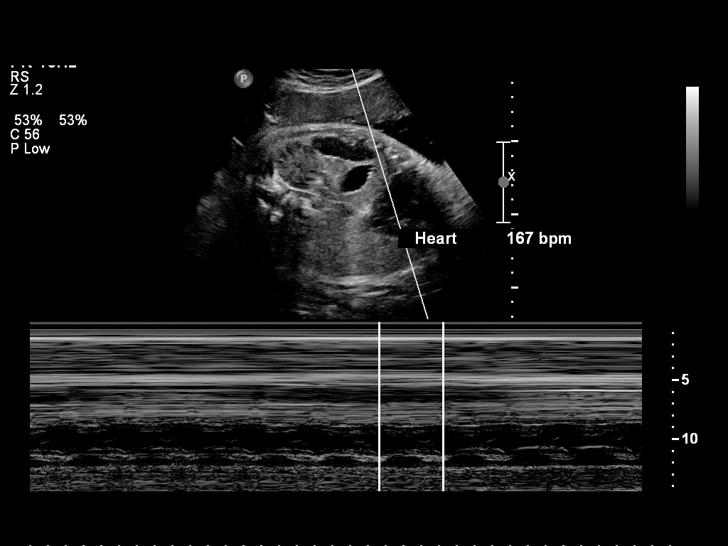
[im 13/14]
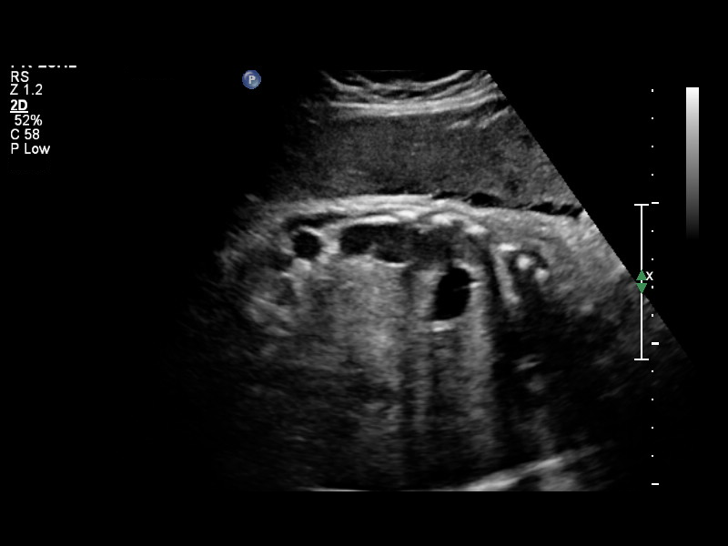
[im 14/14]
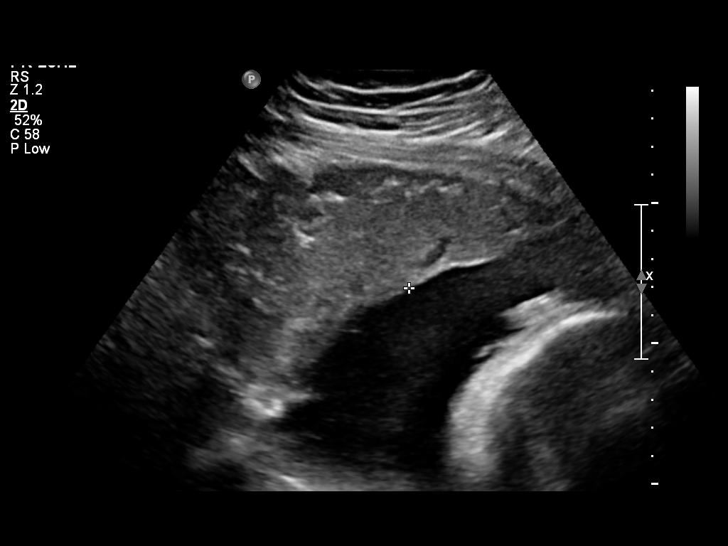

[14 of 15 positions shown; findings below may reference images not displayed]

BIOPHYSICAL PROFILE

Number of Fetuses: 1
Heart Rate: 167  bpm
Presentation: Cephalic
Movement: Present
Placental Location:  Anterior
Previa:  None
Amniotic Fluid (Subjective):  Normal

Vertical pocket:  7.7 cm

FL:  7.26 cm      37 w  2 d

MATERNAL FINDINGS:
Cervix:  Not evaluated /
Uterus/Adnexae:  Ovaries not seen

BPP:
Movement:  2      Time:  6 minutes
Breathing: 2
Tone:   2
Amniotic Fluid:  2

Total Score:  [DATE]
IMPRESSION: 1.  Single living intrauterine fetus in cephalic presentation.
2.  Subjectively normal volume amniotic fluid.
3.  Five distal profile is [DATE].

Recommend followup with non-emergent complete OB 14+ wk US
examination for fetal biometric evaluation and anatomic survey if
not already performed.

## 2013-07-17 ENCOUNTER — Encounter (HOSPITAL_COMMUNITY): Payer: Self-pay | Admitting: Emergency Medicine

## 2013-07-17 ENCOUNTER — Emergency Department (HOSPITAL_COMMUNITY)
Admission: EM | Admit: 2013-07-17 | Discharge: 2013-07-17 | Disposition: A | Discharge status: Home / Self Care | Payer: Managed Care, Other (non HMO) | Attending: Emergency Medicine | Admitting: Emergency Medicine

## 2013-07-17 DIAGNOSIS — Z79899 Other long term (current) drug therapy: Secondary | ICD-10-CM | POA: Insufficient documentation

## 2013-07-17 DIAGNOSIS — W010XXA Fall on same level from slipping, tripping and stumbling without subsequent striking against object, initial encounter: Secondary | ICD-10-CM | POA: Insufficient documentation

## 2013-07-17 DIAGNOSIS — J45909 Unspecified asthma, uncomplicated: Secondary | ICD-10-CM | POA: Insufficient documentation

## 2013-07-17 DIAGNOSIS — Z87891 Personal history of nicotine dependence: Secondary | ICD-10-CM | POA: Insufficient documentation

## 2013-07-17 DIAGNOSIS — S91209A Unspecified open wound of unspecified toe(s) with damage to nail, initial encounter: Secondary | ICD-10-CM

## 2013-07-17 DIAGNOSIS — Y92838 Other recreation area as the place of occurrence of the external cause: Secondary | ICD-10-CM

## 2013-07-17 DIAGNOSIS — S91109A Unspecified open wound of unspecified toe(s) without damage to nail, initial encounter: Secondary | ICD-10-CM | POA: Insufficient documentation

## 2013-07-17 DIAGNOSIS — Y9239 Other specified sports and athletic area as the place of occurrence of the external cause: Secondary | ICD-10-CM | POA: Insufficient documentation

## 2013-07-17 DIAGNOSIS — Y9389 Activity, other specified: Secondary | ICD-10-CM | POA: Insufficient documentation

## 2013-07-17 MED ORDER — BACITRACIN ZINC 500 UNIT/GM EX OINT
TOPICAL_OINTMENT | CUTANEOUS | Status: AC
  Filled 2013-07-17: qty 0.9

## 2013-07-17 MED ORDER — TRAMADOL HCL 50 MG PO TABS: 50.0000 mg | ORAL_TABLET | Freq: Four times a day (QID) | ORAL | Status: DC | PRN

## 2013-07-17 NOTE — Discharge Instructions (Signed)
Fingernail or Toenail Loss All or part of your fingernail or toenail has been lost. This may or may not grow back as a normal nail. A special non-stick bandage has been put on your finger or toe tightly to prevent bleeding. HOME CARE INSTRUCTIONS  The tips of fingers and toes are full of nerves and injuries are often very painful. The following will help you decrease the pain and obtain the best outcome.  Keep your hand or foot elevated above your heart to relieve pain and swelling. This will require lying in bed or on a couch with the hand or leg on pillows or sitting in a recliner with the leg up. Letting your hand or leg dangle may increase swelling, slow healing and cause throbbing pain.  Keep your dressing dry and clean.  Change your bandage in 24 hours after going home.  After your bandage is changed, soak your hand or foot in warm soapy water for 10 to 20 minutes. Do this 3 times per day. This helps reduce pain and swelling. After soaking, apply a clean, dry bandage. Change your bandage if it is wet or dirty.  Only take over-the-counter or prescription medicines for pain, discomfort, or fever as directed by your caregiver.  See your caregiver as needed for problems. SEEK IMMEDIATE MEDICAL CARE IF:   You have increased pain, swelling, drainage, or bleeding.  You have a fever. MAKE SURE YOU:   Understand these instructions.  Will watch your condition.  Will get help right away if you are not doing well or get worse. Document Released: 12/07/2005 Document Revised: 04/09/2011 Document Reviewed: 02/26/2006 Exit[employer] Patient Information 2015 [employer]. This information is not intended to replace advice given to you by your health care provider. Make sure you discuss any questions you have with your health care provider.  

## 2013-07-17 NOTE — ED Provider Notes (Signed)
Medical screening examination/treatment/procedure(s) were performed by non-physician practitioner and as supervising physician I was immediately available for consultation/collaboration.   EKG Interpretation None        Layla MawKristen N Ward, DO 07/17/13 1441

## 2013-07-17 NOTE — ED Notes (Signed)
Toenail on l/first toe torn partway off while in swimming pool today. Bleeding controlled

## 2013-07-17 NOTE — ED Provider Notes (Signed)
CSN: [id number]     Arrival date & time 07/17/13  1243 History  This chart was scribed for Fayrene HelperBowie Zykira Matlack, PA-C, working with Layla MawKristen N Ward, DO, by [facility] Scribe. This patient was seen in room WTR5/WTR5 and the patient's care was started at 1:05 PM.   Chief Complaint  Patient presents with  . Toe Injury    toe nail torn off    The history is provided by the patient. No language interpreter was used.    HPI Comments: Jill Sandoval is a 23 y.o. female who presents to the Emergency Department complaining of a left great toe injury that occurred earlier today. Patient states she injured the left toe after slipping on a ladder in the pool. The toenail was partially removed at the time of injury. She is having constant, moderate pain to the toe that does not radiate to any other areas. She states that she did not sustain any other injuries today. Patient reports her Tetanus vaccinations are UTD.   Past Medical History  Diagnosis Date  . No pertinent past medical history   . Asthma     inhaler prn   Past Surgical History  Procedure Laterality Date  . No past surgeries    . Cesarean section N/A 03/24/2012    Procedure: CESAREAN SECTION;  Surgeon: Mickel Baasichard D Kaplan, MD;  Location: [facility] ORS;  Service: Obstetrics;  Laterality: N/A;   Family History  Problem Relation Age of Onset  . Other Neg Hx   . Hypertension Maternal Grandmother   . Cancer Maternal Grandfather     prostate  . Hearing loss Brother   . Hearing loss Paternal Grandfather    History  Substance Use Topics  . Smoking status: Former Smoker -- 0.25 packs/day    Types: Cigarettes    Quit date: 07/03/2011  . Smokeless tobacco: Never Used  . Alcohol Use: No   OB History   Grav Para Term Preterm Abortions TAB SAB Ect Mult Living   1 1 1       1      Review of Systems  Skin: Positive for wound (left great toe).   Allergies  Review of patient's allergies indicates no known allergies.  Home Medications   Prior  to Admission medications   Medication Sig Start Date End Date Taking? Authorizing Provider  albuterol (PROVENTIL HFA;VENTOLIN HFA) 108 (90 BASE) MCG/ACT inhaler Inhale 2 puffs into the lungs every 6 (six) hours as needed (SOB/Wheezing).    Historical Provider, MD  flintstones complete (FLINTSTONES) 60 MG chewable tablet Chew 1 tablet by mouth daily.    Historical Provider, MD  oxyCODONE-acetaminophen (PERCOCET/ROXICET) 5-325 MG per tablet Take 1-2 tablets by mouth every 4 (four) hours as needed. 03/27/12   Levi AlandMark E Anderson, MD   Triage Vitals: BP 143/65  Pulse 96  Temp(Src) 97.8 F (36.6 C) (Oral)  Resp 16  SpO2 100%  Breastfeeding? No  Physical Exam  Nursing note and vitals reviewed. Constitutional: She is oriented to person, place, and time. She appears well-developed and well-nourished.  HENT:  Head: Normocephalic.  Eyes: EOM are normal.  Neck: Normal range of motion.  Pulmonary/Chest: Effort normal.  Abdominal: She exhibits no distension.  Musculoskeletal: Normal range of motion.  Neurological: She is alert and oriented to person, place, and time.  Skin:  Left great toe: near complete avulsion of toenail with nail bed exposed. No deep laceration. No foreign object. Toe is tender to palpation, but no deformity.  Psychiatric: She  has a normal mood and affect.    ED Course  NAIL REMOVAL Date/Time: 07/17/2013 1:44 PM Performed by: Fayrene HelperRAN, Camela Wich Authorized by: Fayrene HelperRAN, Eliyanna Ault Consent: Verbal consent obtained. Risks and benefits: risks, benefits and alternatives were discussed Consent given by: patient Patient understanding: patient states understanding of the procedure being performed Patient consent: the patient's understanding of the procedure matches consent given Patient identity confirmed: verbally with patient and arm band Time out: Immediately prior to procedure a "time out" was called to verify the correct patient, procedure, equipment, support staff and site/side marked as  required. Location: left foot Location details: left big toe Anesthesia: digital block Local anesthetic: lidocaine 2% without epinephrine Anesthetic total: 5 ml Patient sedated: no Preparation: skin prepped with Betadine Amount removed: complete Wedge excision of skin of nail fold: yes Nail bed sutured: no Nail matrix removed: partial Removed nail replaced and anchored: no Dressing: antibiotic ointment and dressing applied Patient tolerance: Patient tolerated the procedure well with no immediate complications.   (including critical care time)  DIAGNOSTIC STUDIES: Oxygen Saturation is 100% on RA, normal by my interpretation.    COORDINATION OF CARE: 1:09 PM-  Discussed options for wound care and toenail removal. Pt advised of plan for treatment and pt agrees.    Labs Review Labs Reviewed - No data to display  Imaging Review No results found.   EKG Interpretation None      MDM   Final diagnoses:  Traumatic avulsion of nail plate of toe, initial encounter    BP 143/65  Pulse 96  Temp(Src) 97.8 F (36.6 C) (Oral)  Resp 16  SpO2 100%  Breastfeeding? No   I personally performed the services described in this documentation, which was scribed in my presence. The recorded information has been reviewed and is accurate.     Fayrene HelperBowie Nykeria Mealing, PA-C 07/17/13 1345

## 2013-11-30 ENCOUNTER — Encounter (HOSPITAL_COMMUNITY): Payer: Self-pay | Admitting: Emergency Medicine

## 2015-01-30 NOTE — L&D Delivery Note (Signed)
Pt is a 25 yr old white female at 4327 weeks who was admitted for a fetal demise. Pt was admitted and given cytotec. She progressed rapidly. She had a SVD of one dead female infant. The cord was very hyperspiraled causing occlusion of the cord. It appeared that the baby had died 1-2 weeks ago. There were no anatomical abnormalities. Placenta was small and cord as noted above. Delivered intact. No vulvar tears. Pt was offered genetic testing and an autopsy.She declined. Will check Torch Igm titers

## 2015-02-03 ENCOUNTER — Other Ambulatory Visit: Payer: Self-pay | Admitting: Obstetrics & Gynecology

## 2015-02-04 LAB — CYTOLOGY - PAP

## 2015-05-30 ENCOUNTER — Other Ambulatory Visit: Payer: Self-pay | Admitting: Obstetrics and Gynecology

## 2015-06-07 ENCOUNTER — Inpatient Hospital Stay (HOSPITAL_COMMUNITY): Payer: PRIVATE HEALTH INSURANCE

## 2015-06-07 ENCOUNTER — Encounter (HOSPITAL_COMMUNITY): Payer: Self-pay | Admitting: Student

## 2015-06-07 ENCOUNTER — Inpatient Hospital Stay (HOSPITAL_COMMUNITY)
Admission: AD | Admit: 2015-06-07 | Discharge: 2015-06-07 | Disposition: A | Discharge status: Home / Self Care | Payer: PRIVATE HEALTH INSURANCE | Source: Ambulatory Visit | Attending: Obstetrics and Gynecology | Admitting: Obstetrics and Gynecology

## 2015-06-07 DIAGNOSIS — Z87891 Personal history of nicotine dependence: Secondary | ICD-10-CM | POA: Diagnosis not present

## 2015-06-07 DIAGNOSIS — J45909 Unspecified asthma, uncomplicated: Secondary | ICD-10-CM | POA: Insufficient documentation

## 2015-06-07 DIAGNOSIS — O4691 Antepartum hemorrhage, unspecified, first trimester: Secondary | ICD-10-CM

## 2015-06-07 DIAGNOSIS — Z3491 Encounter for supervision of normal pregnancy, unspecified, first trimester: Secondary | ICD-10-CM

## 2015-06-07 DIAGNOSIS — O209 Hemorrhage in early pregnancy, unspecified: Secondary | ICD-10-CM | POA: Insufficient documentation

## 2015-06-07 DIAGNOSIS — Z3A01 Less than 8 weeks gestation of pregnancy: Secondary | ICD-10-CM | POA: Diagnosis not present

## 2015-06-07 DIAGNOSIS — N939 Abnormal uterine and vaginal bleeding, unspecified: Secondary | ICD-10-CM | POA: Diagnosis present

## 2015-06-07 LAB — CBC
HEMATOCRIT: 38 % (ref 36.0–46.0)
HEMOGLOBIN: 12.6 g/dL (ref 12.0–15.0)
MCH: 28.3 pg (ref 26.0–34.0)
MCHC: 33.2 g/dL (ref 30.0–36.0)
MCV: 85.4 fL (ref 78.0–100.0)
Platelets: 290 10*3/uL (ref 150–400)
RBC: 4.45 MIL/uL (ref 3.87–5.11)
RDW: 14.2 % (ref 11.5–15.5)
WBC: 12.4 10*3/uL — AB (ref 4.0–10.5)

## 2015-06-07 LAB — URINALYSIS, ROUTINE W REFLEX MICROSCOPIC
Bilirubin Urine: NEGATIVE
Glucose, UA: NEGATIVE mg/dL
Ketones, ur: NEGATIVE mg/dL
Leukocytes, UA: NEGATIVE
NITRITE: NEGATIVE
PH: 5.5 (ref 5.0–8.0)
Protein, ur: NEGATIVE mg/dL

## 2015-06-07 LAB — URINE MICROSCOPIC-ADD ON

## 2015-06-07 LAB — HCG, QUANTITATIVE, PREGNANCY: hCG, Beta Chain, Quant, S: 39729 m[IU]/mL — ABNORMAL HIGH (ref <5)

## 2015-06-07 LAB — POCT PREGNANCY, URINE: PREG TEST UR: POSITIVE — AB

## 2015-06-07 NOTE — Discharge Instructions (Signed)

## 2015-06-07 NOTE — MAU Note (Signed)
Had some brown blood this am when wiped and now alittle redder. Called office and played phone tag all morning but i need some answers so I came in. No pain.

## 2015-06-07 NOTE — MAU Provider Note (Signed)
History     CSN: [id number]  Arrival date and time: 06/07/15 1144   First Provider Initiated Contact with Patient 06/07/15 1213       Chief Complaint  Patient presents with  . Vaginal Bleeding   HPI  Jill Sandoval is a 25 y.o. G2P1001 at [redacted]w[redacted]d who presents with vaginal bleeding. Reports 2 episodes of vaginal bleeding on toilet paper this morning; first brown, then red. Denies abdominal pain, n/v/d, constipation, dysuria, or vaginal discharge. No intercourse this week.  Was seen in the office last week and has viability ultrasound scheduled on Thursday.   OB History    Gravida Para Term Preterm AB TAB SAB Ectopic Multiple Living   Past Medical History  Diagnosis Date  . Asthma     inhaler prn    Past Surgical History  Procedure Laterality Date  . Cesarean section N/A 03/24/2012    Procedure: CESAREAN SECTION;  Surgeon: Mickel Baas, MD;  Location: [facility];  Service: Obstetrics;  Laterality: N/A;    Family History  Problem Relation Age of Onset  . Other Neg Hx   . Hypertension Maternal Grandmother   . Cancer Maternal Grandfather     prostate  . Hearing loss Brother   . Hearing loss Paternal Grandfather     Social History  Substance Use Topics  . Smoking status: Former Smoker -- 0.25 packs/day    Types: Cigarettes    Quit date: 07/03/2011  . Smokeless tobacco: Never Used  . Alcohol Use: No    Allergies: No Known Allergies  Prescriptions prior to admission  Medication Sig Dispense Refill Last Dose  . albuterol (PROVENTIL HFA;VENTOLIN HFA) 108 (90 BASE) MCG/ACT inhaler Inhale 2 puffs into the lungs every 6 (six) hours as needed (SOB/Wheezing).     Marland Kitchen ibuprofen (ADVIL,MOTRIN) 200 MG tablet Take 200 mg by mouth every 6 (six) hours as needed for headache.   07/16/2013 at Unknown time  . traMADol (ULTRAM) 50 MG tablet Take 1 tablet (50 mg total) by mouth every 6 (six) hours as needed. 15 tablet 0     Review of Systems  Constitutional:  Negative.   Gastrointestinal: Negative.  Negative for abdominal pain.  Genitourinary: Negative for dysuria.       + vaginal bleeding   Physical Exam   Blood pressure 128/72, pulse 93, temperature 98.1 F (36.7 C), resp. rate 20, height  (1.676 m), weight 237 lb 6.4 oz (107.684 kg).  Physical Exam  Nursing note and vitals reviewed. Constitutional: She is oriented to person, place, and time. She appears well-developed and well-nourished. No distress.  HENT:  Head: Normocephalic and atraumatic.  Eyes: Conjunctivae are normal. Right eye exhibits no discharge. Left eye exhibits no discharge. No scleral icterus.  Neck: Normal range of motion.  Cardiovascular: Normal rate.   Respiratory: Effort normal. No respiratory distress.  GI: Soft. She exhibits no distension. There is no tenderness.  Genitourinary: Cervix exhibits no motion tenderness, no discharge and no friability. Vaginal discharge (minimal amount of brown discharge) found.  Cervix closed  Neurological: She is alert and oriented to person, place, and time.  Skin: Skin is warm and dry. She is not diaphoretic.  Psychiatric: She has a normal mood and affect. Her behavior is normal. Judgment and thought content normal.    MAU Course  Procedures Results for orders placed or performed during the hospital encounter of 06/07/15 (from the  past 24 hour(s))  Urinalysis, Routine w reflex microscopic (not at [facility])     Status: Abnormal   Collection Time: 06/07/15 12:07 PM  Result Value Ref Range   Color, Urine YELLOW YELLOW   APPearance HAZY (A) CLEAR   Specific Gravity, Urine >1.030 (H) 1.005 - 1.030   pH 5.5 5.0 - 8.0   Glucose, UA NEGATIVE NEGATIVE mg/dL   Hgb urine dipstick LARGE (A) NEGATIVE   Bilirubin Urine NEGATIVE NEGATIVE   Ketones, ur NEGATIVE NEGATIVE mg/dL   Protein, ur NEGATIVE NEGATIVE mg/dL   Nitrite NEGATIVE NEGATIVE   Leukocytes, UA NEGATIVE NEGATIVE  Urine microscopic-add on     Status: Abnormal   Collection  Time: 06/07/15 12:07 PM  Result Value Ref Range   Squamous Epithelial / LPF 0-5 (A) NONE SEEN   WBC, UA 0-5 0 - 5 WBC/hpf   RBC / HPF 0-5 0 - 5 RBC/hpf   Bacteria, UA FEW (A) NONE SEEN  Pregnancy, urine POC     Status: Abnormal   Collection Time: 06/07/15 12:10 PM  Result Value Ref Range   Preg Test, Ur POSITIVE (A) NEGATIVE  hCG, quantitative, pregnancy     Status: Abnormal   Collection Time: 06/07/15 12:57 PM  Result Value Ref Range   hCG, Beta Chain, Quant, S 39729 (H) <5 mIU/mL  CBC     Status: Abnormal   Collection Time: 06/07/15 12:58 PM  Result Value Ref Range   WBC 12.4 (H) 4.0 - 10.5 K/uL   RBC 4.45 3.87 - 5.11 MIL/uL   Hemoglobin 12.6 12.0 - 15.0 g/dL   HCT 16.1 09.6 - 04.5 %   MCV 85.4 78.0 - 100.0 fL   MCH 28.3 26.0 - 34.0 pg   MCHC 33.2 30.0 - 36.0 g/dL   RDW 40.9 81.1 - 91.4 %   Platelets 290 150 - 400 K/uL   US Ob Comp Less 14 Wks  06/07/2015  CLINICAL DATA:  Vaginal bleeding and first-trimester pregnancy EXAM: OBSTETRIC <14 WK [place] AND TRANSVAGINAL OB US TECHNIQUE: Both transabdominal and transvaginal ultrasound examinations were performed for complete evaluation of the gestation as well as the maternal uterus, adnexal regions, and pelvic cul-de-sac. Transvaginal technique was performed to assess early pregnancy. COMPARISON:  None. FINDINGS: Intrauterine gestational sac: Present and unremarkable Yolk sac:  Present Embryo:  Present Heart Rate:  103 bpm CRL:  2.7  mm   5 w   6 d                  [place] EDC: 02/01/2016 Subchorionic hemorrhage:  None visualized. Maternal uterus/adnexae: The ovaries are not visualized. No adnexal mass. IMPRESSION: Single living intrauterine pregnancy measuring 5 weeks 6 days. No pathologic finding. Electronically Signed   By: Marnee Spring M.D.   On: 06/07/2015 15:20   US Ob Transvaginal  06/07/2015  CLINICAL DATA:  Vaginal bleeding and first-trimester pregnancy EXAM: OBSTETRIC <14 WK [place] AND TRANSVAGINAL OB US TECHNIQUE: Both transabdominal and  transvaginal ultrasound examinations were performed for complete evaluation of the gestation as well as the maternal uterus, adnexal regions, and pelvic cul-de-sac. Transvaginal technique was performed to assess early pregnancy. COMPARISON:  None. FINDINGS: Intrauterine gestational sac: Present and unremarkable Yolk sac:  Present Embryo:  Present Heart Rate:  103 bpm CRL:  2.7  mm   5 w   6 d                  [place] EDC: 02/01/2016 Subchorionic hemorrhage:  None visualized.  Maternal uterus/adnexae: The ovaries are not visualized. No adnexal mass. IMPRESSION: Single living intrauterine pregnancy measuring 5 weeks 6 days. No pathologic finding. Electronically Signed   By: Marnee SpringJonathon  Watts M.D.   On: 06/07/2015 15:20    MDM O positive CBC, BHCG, ultrasound Ultrasound shows SIUP with cardiac activity S/w Dr. Henderson CloudHorvath. Ok to discharge home. Cancel ultrasound appointment in office and follow up for routine prenatal visit   Assessment and Plan  A; 1. Normal IUP (intrauterine pregnancy) on prenatal ultrasound, first trimester   2. Vaginal bleeding in pregnancy, first trimester     P: Discharge home Pelvic rest Cancel ultrasound F/u for ROB Discussed reasons to return  Judeth Hornrin Lawrence 06/07/2015, 12:13 PM

## 2015-06-30 ENCOUNTER — Other Ambulatory Visit: Payer: Self-pay | Admitting: Family Medicine

## 2015-07-01 LAB — CMP12+LP+TP+TSH+6AC+CBC/D/PLT
ALK PHOS: 59 IU/L (ref 39–117)
ALT: 39 IU/L — AB (ref 0–32)
AST: 23 IU/L (ref 0–40)
Albumin/Globulin Ratio: 1.7 (ref 1.2–2.2)
Albumin: 4.2 g/dL (ref 3.5–5.5)
BUN/Creatinine Ratio: 13 (ref 9–23)
BUN: 9 mg/dL (ref 6–20)
Basophils Absolute: 0 10*3/uL (ref 0.0–0.2)
Basos: 0 %
Bilirubin Total: 0.2 mg/dL (ref 0.0–1.2)
CALCIUM: 9.5 mg/dL (ref 8.7–10.2)
CHLORIDE: 101 mmol/L (ref 96–106)
CHOL/HDL RATIO: 3.4 ratio (ref 0.0–4.4)
CREATININE: 0.72 mg/dL (ref 0.57–1.00)
Cholesterol, Total: 194 mg/dL (ref 100–199)
EOS (ABSOLUTE): 0.2 10*3/uL (ref 0.0–0.4)
Eos: 2 %
Estimated CHD Risk: 0.5 times avg. (ref 0.0–1.0)
Free Thyroxine Index: 1.6 (ref 1.2–4.9)
GFR calc Af Amer: 136 mL/min/{1.73_m2} (ref >59)
GFR, EST NON AFRICAN AMERICAN: 118 mL/min/{1.73_m2} (ref >59)
GGT: 16 IU/L (ref 0–60)
GLUCOSE: 101 mg/dL — AB (ref 65–99)
Globulin, Total: 2.5 g/dL (ref 1.5–4.5)
HDL: 57 mg/dL (ref >39)
HEMATOCRIT: 38.1 % (ref 34.0–46.6)
Hemoglobin: 12.6 g/dL (ref 11.1–15.9)
Immature Grans (Abs): 0 10*3/uL (ref 0.0–0.1)
Immature Granulocytes: 0 %
Iron: 51 ug/dL (ref 27–159)
LDH: 198 IU/L (ref 119–226)
LDL CALC: 103 mg/dL — AB (ref 0–99)
LYMPHS ABS: 2.4 10*3/uL (ref 0.7–3.1)
LYMPHS: 25 %
MCH: 27.7 pg (ref 26.6–33.0)
MCHC: 33.1 g/dL (ref 31.5–35.7)
MCV: 84 fL (ref 79–97)
MONOS ABS: 0.4 10*3/uL (ref 0.1–0.9)
Monocytes: 4 %
NEUTROS ABS: 6.6 10*3/uL (ref 1.4–7.0)
Neutrophils: 69 %
PHOSPHORUS: 3.2 mg/dL (ref 2.5–4.5)
POTASSIUM: 4.1 mmol/L (ref 3.5–5.2)
Platelets: 320 10*3/uL (ref 150–379)
RBC: 4.55 x10E6/uL (ref 3.77–5.28)
RDW: 13.9 % (ref 12.3–15.4)
Sodium: 136 mmol/L (ref 134–144)
T3 Uptake Ratio: 17 % — ABNORMAL LOW (ref 24–39)
T4 TOTAL: 9.5 ug/dL (ref 4.5–12.0)
TSH: 1.27 u[IU]/mL (ref 0.450–4.500)
Total Protein: 6.7 g/dL (ref 6.0–8.5)
Triglycerides: 168 mg/dL — ABNORMAL HIGH (ref 0–149)
URIC ACID: 1.4 mg/dL — AB (ref 2.5–7.1)
VLDL Cholesterol Cal: 34 mg/dL (ref 5–40)
WBC: 9.6 10*3/uL (ref 3.4–10.8)

## 2015-07-01 LAB — HGB A1C W/O EAG: HEMOGLOBIN A1C: 5 % (ref 4.8–5.6)

## 2015-09-10 ENCOUNTER — Inpatient Hospital Stay (HOSPITAL_COMMUNITY): Payer: PRIVATE HEALTH INSURANCE

## 2015-09-10 ENCOUNTER — Encounter (HOSPITAL_COMMUNITY): Payer: Self-pay

## 2015-09-10 ENCOUNTER — Inpatient Hospital Stay (HOSPITAL_COMMUNITY)
Admission: AD | Admit: 2015-09-10 | Discharge: 2015-09-10 | Disposition: A | Discharge status: Home / Self Care | Payer: PRIVATE HEALTH INSURANCE | Source: Ambulatory Visit | Attending: Obstetrics and Gynecology | Admitting: Obstetrics and Gynecology

## 2015-09-10 DIAGNOSIS — O468X2 Other antepartum hemorrhage, second trimester: Secondary | ICD-10-CM | POA: Diagnosis not present

## 2015-09-10 DIAGNOSIS — Z79899 Other long term (current) drug therapy: Secondary | ICD-10-CM | POA: Insufficient documentation

## 2015-09-10 DIAGNOSIS — O418X20 Other specified disorders of amniotic fluid and membranes, second trimester, not applicable or unspecified: Secondary | ICD-10-CM

## 2015-09-10 DIAGNOSIS — Z87891 Personal history of nicotine dependence: Secondary | ICD-10-CM | POA: Insufficient documentation

## 2015-09-10 DIAGNOSIS — O4690 Antepartum hemorrhage, unspecified, unspecified trimester: Secondary | ICD-10-CM

## 2015-09-10 DIAGNOSIS — Z9889 Other specified postprocedural states: Secondary | ICD-10-CM | POA: Insufficient documentation

## 2015-09-10 DIAGNOSIS — Z3A19 19 weeks gestation of pregnancy: Secondary | ICD-10-CM | POA: Insufficient documentation

## 2015-09-10 DIAGNOSIS — O99512 Diseases of the respiratory system complicating pregnancy, second trimester: Secondary | ICD-10-CM | POA: Diagnosis not present

## 2015-09-10 DIAGNOSIS — O4692 Antepartum hemorrhage, unspecified, second trimester: Secondary | ICD-10-CM

## 2015-09-10 DIAGNOSIS — Z6790 Unspecified blood type, Rh positive: Secondary | ICD-10-CM | POA: Diagnosis not present

## 2015-09-10 LAB — CBC
HCT: 35 % — ABNORMAL LOW (ref 36.0–46.0)
HEMOGLOBIN: 11.7 g/dL — AB (ref 12.0–15.0)
MCH: 27.7 pg (ref 26.0–34.0)
MCHC: 33.4 g/dL (ref 30.0–36.0)
MCV: 82.9 fL (ref 78.0–100.0)
PLATELETS: 279 10*3/uL (ref 150–400)
RBC: 4.22 MIL/uL (ref 3.87–5.11)
RDW: 13.8 % (ref 11.5–15.5)
WBC: 14.3 10*3/uL — AB (ref 4.0–10.5)

## 2015-09-10 LAB — RAPID URINE DRUG SCREEN, HOSP PERFORMED
Amphetamines: NOT DETECTED
BARBITURATES: NOT DETECTED
Benzodiazepines: NOT DETECTED
COCAINE: NOT DETECTED
Opiates: NOT DETECTED
TETRAHYDROCANNABINOL: NOT DETECTED

## 2015-09-10 NOTE — MAU Provider Note (Signed)
History     CSN: [id number]  Arrival date and time: 09/10/15 0038   None     Chief Complaint  Patient presents with  . Vaginal Bleeding   G2P1001 @19 .3 wks c/o acute onset of bright red vaginal bleeding about 2 hours ago. She reports that she got up and felt wet and found a large amt of blood soaking underwear. She saturated 1-2 a pad over 30 minutes. She denies pain, cramping, or ctx. No recent IC. No recent falls or injury. Denies drug or tobacco use. Pregnancy has been complicated by first trimester bleeding. Pt also reports a "tear" noted on last ultrasound, could not elaborate, was told not concerning. No prenatal records on file.     OB History    Gravida Para Term Preterm AB Living   2 1 1     1    SAB TAB Ectopic Multiple Live Births           1      Past Medical History:  Diagnosis Date  . Asthma    inhaler prn    Past Surgical History:  Procedure Laterality Date  . CESAREAN SECTION N/A 03/24/2012   Procedure: CESAREAN SECTION;  Surgeon: Mickel Baasichard D Kaplan, MD;  Location: [facility];  Service: Obstetrics;  Laterality: N/A;    Family History  Problem Relation Age of Onset  . Hypertension Maternal Grandmother   . Cancer Maternal Grandfather     prostate  . Hearing loss Brother   . Hearing loss Paternal Grandfather   . Other Neg Hx     Social History  Substance Use Topics  . Smoking status: Former Smoker    Packs/day: 0.25    Types: Cigarettes    Quit date: 07/03/2011  . Smokeless tobacco: Never Used  . Alcohol use No    Allergies: No Known Allergies  Prescriptions Prior to Admission  Medication Sig Dispense Refill Last Dose  . Prenatal Vit-Fe Fumarate-FA (PRENATAL MULTIVITAMIN) TABS tablet Take 1 tablet by mouth daily at 12 noon.   Past Week at Unknown time  . albuterol (PROVENTIL HFA;VENTOLIN HFA) 108 (90 BASE) MCG/ACT inhaler Inhale 2 puffs into the lungs every 6 (six) hours as needed (SOB/Wheezing).   rescue  . loratadine (CLARITIN) 10 MG tablet Take 10  mg by mouth daily as needed for allergies.   06/06/2015 at Unknown time  . ondansetron (ZOFRAN) 8 MG tablet Take 8 mg by mouth every 8 (eight) hours as needed for nausea or vomiting.   06/04/2015    Review of Systems  Constitutional: Negative.   Gastrointestinal: Negative.   Genitourinary: Negative.    Physical Exam   Blood pressure 121/56, pulse 87, temperature 98.2 F (36.8 C), temperature source Oral, resp. rate 20, SpO2 100 %.  Physical Exam  Constitutional: She is oriented to person, place, and time. She appears well-developed and well-nourished.  HENT:  Head: Normocephalic and atraumatic.  Neck: Normal range of motion.  Cardiovascular: Normal rate.   Respiratory: Effort normal.  GI: Soft. She exhibits no distension. There is no tenderness.  gravid  Genitourinary:  Genitourinary Comments: External: no lesions Vagina: rugated, parous, large amount bright red blood in culdesac, no active bleeding, mucous present at os, visually closed SVE: closed/long   Musculoskeletal: Normal range of motion.  Neurological: She is alert and oriented to person, place, and time.  Skin: Skin is warm and dry.  Psychiatric: She has a normal mood and affect.   FHT: 145 bpm Results for orders placed or  performed during the hospital encounter of 09/10/15 (from the past 24 hour(s))  Urine rapid drug screen (hosp performed)     Status: None   Collection Time: 09/10/15  1:30 AM  Result Value Ref Range   Opiates NONE DETECTED NONE DETECTED   Cocaine NONE DETECTED NONE DETECTED   Benzodiazepines NONE DETECTED NONE DETECTED   Amphetamines NONE DETECTED NONE DETECTED   Tetrahydrocannabinol NONE DETECTED NONE DETECTED   Barbiturates NONE DETECTED NONE DETECTED  CBC     Status: Abnormal   Collection Time: 09/10/15  1:31 AM  Result Value Ref Range   WBC 14.3 (H) 4.0 - 10.5 K/uL   RBC 4.22 3.87 - 5.11 MIL/uL   Hemoglobin 11.7 (L) 12.0 - 15.0 g/dL   HCT 53.6 (L) 64.4 - 03.4 %   MCV 82.9 78.0 - 100.0 fL    MCH 27.7 26.0 - 34.0 pg   MCHC 33.4 30.0 - 36.0 g/dL   RDW 74.2 59.5 - 63.8 %   Platelets 279 150 - 400 K/uL   [place]: FHR 142, placenta above os, AFV nml, left anterior [facility] 4cmx2cmx2cm  MAU Course  Procedures  MDM Labs ordered and reviewed. Bleeding tapered to light pink. No evidence of previa, abruption, or PTL. Findings discussed with Dr. Dareen Piano. Stable for discharge home.  Assessment and Plan   1. Subchorionic hemorrhage, second trimester   2. Vaginal bleeding in pregnancy   3. Rh(D) positive    Discharge home Bleeding precautions Follow up as scheduled in office in 3 days Return for worsening sx  Donette Larry, CNM 09/10/2015, 1:23 AM

## 2015-09-10 NOTE — MAU Note (Signed)
Vaginal bleeding started at 11pm. Front of underwear was soaked and in commode.  No blood clots.  Last sex was month ago.  No pain.

## 2015-09-10 NOTE — Discharge Instructions (Signed)
Subchorionic Hematoma °A subchorionic hematoma is a gathering of blood between the outer wall of the placenta and the inner wall of the womb (uterus). The placenta is the organ that connects the fetus to the wall of the uterus. The placenta performs the feeding, breathing (oxygen to the fetus), and waste removal (excretory work) of the fetus.  °Subchorionic hematoma is the most common abnormality found on a result from ultrasonography done during the first trimester or early second trimester of pregnancy. If there has been little or no vaginal bleeding, early small hematomas usually shrink on their own and do not affect your baby or pregnancy. The blood is gradually absorbed over 1-2 weeks. When bleeding starts later in pregnancy or the hematoma is larger or occurs in an older pregnant woman, the outcome may not be as good. Larger hematomas may get bigger, which increases the chances for miscarriage. Subchorionic hematoma also increases the risk of premature detachment of the placenta from the uterus, preterm (premature) labor, and stillbirth. °HOME CARE INSTRUCTIONS °· Stay on bed rest if your health care provider recommends this. Although bed rest will not prevent more bleeding or prevent a miscarriage, your health care provider may recommend bed rest until you are advised otherwise. °· Avoid heavy lifting (more than 10 lb [4.5 kg]), exercise, sexual intercourse, or douching as directed by your health care provider. °· Keep track of the number of pads you use each day and how soaked (saturated) they are. Write down this information. °· Do not use tampons. °· Keep all follow-up appointments as directed by your health care provider. Your health care provider may ask you to have follow-up blood tests or ultrasound tests or both. °SEEK IMMEDIATE MEDICAL CARE IF: °· You have severe cramps in your stomach, back, abdomen, or pelvis. °· You have a fever. °· You pass large clots or tissue. Save any tissue for your health  care provider to look at. °· Your bleeding increases or you become lightheaded, feel weak, or have fainting episodes. °  °This information is not intended to replace advice given to you by your health care provider. Make sure you discuss any questions you have with your health care provider. °  °Document Released: 05/02/2006 Document Revised: 02/05/2014 Document Reviewed: 08/14/2012 °Elsevier Interactive Patient Education ©2016 [employer] Inc[employer] ° °

## 2015-11-03 ENCOUNTER — Inpatient Hospital Stay (HOSPITAL_COMMUNITY)
Admission: AD | Admit: 2015-11-03 | Discharge: 2015-11-05 | DRG: 775 | Disposition: A | Discharge status: Home / Self Care | Payer: Medicaid Other | Source: Ambulatory Visit | Attending: Obstetrics and Gynecology | Admitting: Obstetrics and Gynecology

## 2015-11-03 ENCOUNTER — Encounter (HOSPITAL_COMMUNITY): Payer: Self-pay | Admitting: *Deleted

## 2015-11-03 ENCOUNTER — Inpatient Hospital Stay (HOSPITAL_COMMUNITY): Payer: Medicaid Other

## 2015-11-03 DIAGNOSIS — Z87891 Personal history of nicotine dependence: Secondary | ICD-10-CM

## 2015-11-03 DIAGNOSIS — O34211 Maternal care for low transverse scar from previous cesarean delivery: Secondary | ICD-10-CM | POA: Diagnosis present

## 2015-11-03 DIAGNOSIS — Z8759 Personal history of other complications of pregnancy, childbirth and the puerperium: Secondary | ICD-10-CM | POA: Insufficient documentation

## 2015-11-03 DIAGNOSIS — O9952 Diseases of the respiratory system complicating childbirth: Secondary | ICD-10-CM | POA: Diagnosis present

## 2015-11-03 DIAGNOSIS — J45909 Unspecified asthma, uncomplicated: Secondary | ICD-10-CM | POA: Diagnosis present

## 2015-11-03 DIAGNOSIS — Z3A27 27 weeks gestation of pregnancy: Secondary | ICD-10-CM

## 2015-11-03 DIAGNOSIS — IMO0002 Reserved for concepts with insufficient information to code with codable children: Secondary | ICD-10-CM

## 2015-11-03 DIAGNOSIS — O364XX0 Maternal care for intrauterine death, not applicable or unspecified: Principal | ICD-10-CM | POA: Diagnosis present

## 2015-11-03 LAB — CBC
HCT: 39 % (ref 36.0–46.0)
Hemoglobin: 13.3 g/dL (ref 12.0–15.0)
MCH: 28.3 pg (ref 26.0–34.0)
MCHC: 34.1 g/dL (ref 30.0–36.0)
MCV: 83 fL (ref 78.0–100.0)
PLATELETS: 299 10*3/uL (ref 150–400)
RBC: 4.7 MIL/uL (ref 3.87–5.11)
RDW: 14.8 % (ref 11.5–15.5)
WBC: 13.5 10*3/uL — AB (ref 4.0–10.5)

## 2015-11-03 LAB — TYPE AND SCREEN
ABO/RH(D): O POS
Antibody Screen: NEGATIVE

## 2015-11-03 MED ORDER — LIDOCAINE HCL (PF) 1 % IJ SOLN
30.0000 mL | INTRAMUSCULAR | Status: DC | PRN
  Filled 2015-11-03: qty 30

## 2015-11-03 MED ORDER — LACTATED RINGERS IV SOLN: 500.0000 mL | INTRAVENOUS | Status: DC | PRN

## 2015-11-03 MED ORDER — ACETAMINOPHEN 325 MG PO TABS
650.0000 mg | ORAL_TABLET | ORAL | Status: DC | PRN
  Administered 2015-11-03: 650 mg via ORAL
  Filled 2015-11-03: qty 2

## 2015-11-03 MED ORDER — OXYCODONE-ACETAMINOPHEN 5-325 MG PO TABS
2.0000 | ORAL_TABLET | ORAL | Status: DC | PRN
  Administered 2015-11-04: 2 via ORAL
  Filled 2015-11-03: qty 2

## 2015-11-03 MED ORDER — SOD CITRATE-CITRIC ACID 500-334 MG/5ML PO SOLN: 30.0000 mL | ORAL | Status: DC | PRN

## 2015-11-03 MED ORDER — ONDANSETRON HCL 4 MG/2ML IJ SOLN: 4.0000 mg | Freq: Four times a day (QID) | INTRAMUSCULAR | Status: DC | PRN

## 2015-11-03 MED ORDER — OXYTOCIN BOLUS FROM INFUSION
500.0000 mL | Freq: Once | INTRAVENOUS | Status: AC
  Administered 2015-11-04: 500 mL via INTRAVENOUS

## 2015-11-03 MED ORDER — LACTATED RINGERS IV SOLN
INTRAVENOUS | Status: DC
  Administered 2015-11-03 – 2015-11-04 (×3): via INTRAVENOUS

## 2015-11-03 MED ORDER — MISOPROSTOL 200 MCG PO TABS
200.0000 ug | ORAL_TABLET | ORAL | Status: AC | PRN
  Administered 2015-11-03 – 2015-11-04 (×3): 200 ug via VAGINAL
  Filled 2015-11-03 (×3): qty 1

## 2015-11-03 MED ORDER — OXYTOCIN 40 UNITS IN LACTATED RINGERS INFUSION - SIMPLE MED
2.5000 [IU]/h | INTRAVENOUS | Status: DC
  Filled 2015-11-03: qty 1000

## 2015-11-03 MED ORDER — FLEET ENEMA 7-19 GM/118ML RE ENEM: 1.0000 | ENEMA | RECTAL | Status: DC | PRN

## 2015-11-03 MED ORDER — OXYCODONE-ACETAMINOPHEN 5-325 MG PO TABS: 1.0000 | ORAL_TABLET | ORAL | Status: DC | PRN

## 2015-11-03 NOTE — MAU Provider Note (Signed)
History     CSN: [id number]  Arrival date and time: 11/03/15 1625   First Provider Initiated Contact with Patient 11/03/15 1724      Chief Complaint  Patient presents with  . fetal heart tones not heard in office   HPI Jill Sandoval is a 25 y.o. G2P1001 at 7459w1d who was sent from the office for absent cardiac activity. Was in office today for ROB & seen by Dr. Dareen PianoAnderson who was unable to get fetal heart tones with doppler and bedside ultrasound. Denies abdominal pain, vaginal bleeding, or LOF. Thought the felt baby move and "ball up" as she was leaving the office but otherwise can't tell me last time she felt fetal movement.   OB History    Gravida Para Term Preterm AB Living   2 1 1     1    SAB TAB Ectopic Multiple Live Births           1      Past Medical History:  Diagnosis Date  . Asthma    inhaler prn    Past Surgical History:  Procedure Laterality Date  . CESAREAN SECTION N/A 03/24/2012   Procedure: CESAREAN SECTION;  Surgeon: Mickel Baasichard D Kaplan, MD;  Location: [facility];  Service: Obstetrics;  Laterality: N/A;    Family History  Problem Relation Age of Onset  . Hypertension Maternal Grandmother   . Cancer Maternal Grandfather     prostate  . Hearing loss Brother   . Hearing loss Paternal Grandfather   . Other Neg Hx     Social History  Substance Use Topics  . Smoking status: Former Smoker    Packs/day: 0.25    Types: Cigarettes    Quit date: 07/03/2011  . Smokeless tobacco: Never Used  . Alcohol use No    Allergies: No Known Allergies  Prescriptions Prior to Admission  Medication Sig Dispense Refill Last Dose  . albuterol (PROVENTIL HFA;VENTOLIN HFA) 108 (90 BASE) MCG/ACT inhaler Inhale 2 puffs into the lungs every 6 (six) hours as needed (SOB/Wheezing).   rescue  . loratadine (CLARITIN) 10 MG tablet Take 10 mg by mouth daily as needed for allergies.   06/06/2015 at Unknown time  . ondansetron (ZOFRAN) 8 MG tablet Take 8 mg by mouth every 8 (eight)  hours as needed for nausea or vomiting.   06/04/2015  . Prenatal Vit-Fe Fumarate-FA (PRENATAL MULTIVITAMIN) TABS tablet Take 1 tablet by mouth daily at 12 noon.   Past Week at Unknown time    Review of Systems  Constitutional: Negative.   Gastrointestinal: Negative.   Genitourinary: Negative.    Physical Exam   Blood pressure 111/80, pulse 119, temperature 98.3 F (36.8 C), temperature source Oral.  Physical Exam  Nursing note and vitals reviewed. Constitutional: She is oriented to person, place, and time. She appears well-developed and well-nourished. She appears distressed.  HENT:  Head: Normocephalic and atraumatic.  Eyes: Conjunctivae are normal. Right eye exhibits no discharge. Left eye exhibits no discharge. No scleral icterus.  Neck: Normal range of motion.  Respiratory: Effort normal. No respiratory distress.  GI: Soft. There is no tenderness.  Neurological: She is alert and oriented to person, place, and time.  Skin: Skin is warm and dry. She is not diaphoretic.  Psychiatric: She has a normal mood and affect. Her behavior is normal. Judgment and thought content normal.    MAU Course  Procedures No results found for this or any previous visit (from the past 24 hour(s)).  MDM Unable to get heart tones on EFM Ultrasound at bedside -- no cardiac activity, fetal hydrops Dr. Claiborne Billings notified via birthing suites RN -- will admit patient Chaplain services notified Assessment and Plan  A: 1. IUFD at 20 weeks or more of gestation   2. Fetal heart tones not heard   3. [redacted] weeks gestation of pregnancy    P: Admit to birthing suites  Judeth Horn 11/03/2015, 5:28 PM

## 2015-11-03 NOTE — H&P (Signed)
25 y.o. [redacted]w[redacted]d  G2P1001 comes in from office after routine visit with fetal demise discovered and verified with office US. Pt denies LOF or VB.  Past Medical History:  Diagnosis Date  . Asthma    inhaler prn    Past Surgical History:  Procedure Laterality Date  . CESAREAN SECTION N/A 03/24/2012   Procedure: CESAREAN SECTION;  Surgeon: Mickel Baasichard D Kaplan, MD;  Location: [facility];  Service: Obstetrics;  Laterality: N/A;    OB History  Gravida Para Term Preterm AB Living  2 1 1     1   SAB TAB Ectopic Multiple Live Births          1    # Outcome Date GA Lbr Len/2nd Weight Sex Delivery Anes PTL Lv  2 Current           1 Term 03/24/12 6285w0d  3.894 kg (8 lb 9.4 oz) M CS-LTranv EPI  LIV      Social History   Social History  . Marital status: Married    Spouse name: N/A  . Number of children: N/A  . Years of education: N/A   Occupational History  . Not on file.   Social History Main Topics  . Smoking status: Former Smoker    Packs/day: 0.25    Types: Cigarettes    Quit date: 07/03/2011  . Smokeless tobacco: Never Used  . Alcohol use No  . Drug use: No  . Sexual activity: Yes   Other Topics Concern  . Not on file   Social History Narrative  . No narrative on file   Review of patient's allergies indicates no known allergies.    Prenatal Transfer Tool  Maternal Diabetes: No Genetic Screening: Normal Maternal Ultrasounds/Referrals: Normal Fetal Ultrasounds or other Referrals:  None Maternal Substance Abuse:  No Significant Maternal Medications:  None Significant Maternal Lab Results: None  Other PNC: BMI 38, hypercholesterolemia, asthma    Vitals:   11/03/15 1645 11/03/15 1720  BP: (!) 110/48 111/80  Pulse: (!) 121 119  Temp: 98.3 F (36.8 C)      Lungs/Cor:  NAD Abdomen:  soft, gravid Ex:  no cords, erythema SVE:  Deferred to L&D FHTs:  absent Toco:  Not currently monitored   A/P   Admit 27.1 with fetal demise  Chaplain has seen patient  GBS not  done  H/o c/s - discussed recommendation of IOL with cytotec and discussed safety in setting of prior c/s  Epidural/ IV pain meds as needed  Emalie Mcwethy

## 2015-11-03 NOTE — MAU Note (Signed)
Pt came from Dr. Ewell PoeAnderson's office.  No heartbeat by Doppler or ultrasound.  Pt states she been feeling a hard place since she left from office.  Denies vaginal bleeding, ROM, or abnormal discharge.

## 2015-11-03 NOTE — Progress Notes (Signed)
Night Chaplain paged to provide spiritual care and counsel to Ms Apple-Cleaves after she found out that she had experienced a fetal demise. Her Son, Maurice MarchLane, was diagnosed to no longer be alive. Ms Apple-Stick was surrounded by relatives. She is just coming to terms with the news and both she and her husband are beginning the long process of grief.   Ms Apple-Purkey has a three year old son at home.  Chaplain provided grief counsel, and comfort. Family was reminded that there are resources to assist [facility] and through our associated agencies.  Strongly recommend follow-up spiritual care to continue the conversations of grief.  Page the chaplain should Ms Apple-Molesworth need or request further spiritual care.  Benjie Karvonenharles D. Murrell Elizondo, DMin Night Chaplain

## 2015-11-04 ENCOUNTER — Inpatient Hospital Stay (HOSPITAL_COMMUNITY): Payer: Medicaid Other | Admitting: Anesthesiology

## 2015-11-04 ENCOUNTER — Encounter (HOSPITAL_COMMUNITY): Payer: Self-pay | Admitting: *Deleted

## 2015-11-04 DIAGNOSIS — O364XX0 Maternal care for intrauterine death, not applicable or unspecified: Secondary | ICD-10-CM | POA: Diagnosis present

## 2015-11-04 HISTORY — DX: Maternal care for intrauterine death, not applicable or unspecified: O36.4XX0

## 2015-11-04 LAB — DIC (DISSEMINATED INTRAVASCULAR COAGULATION) PANEL
APTT: 31 s (ref 24–36)
D DIMER QUANT: 1.2 ug{FEU}/mL — AB (ref 0.00–0.50)
FIBRINOGEN: 580 mg/dL — AB (ref 210–475)
SMEAR REVIEW: NONE SEEN

## 2015-11-04 LAB — DIC (DISSEMINATED INTRAVASCULAR COAGULATION)PANEL
INR: 1.03
Platelets: 313 10*3/uL (ref 150–400)
Prothrombin Time: 13.5 seconds (ref 11.4–15.2)

## 2015-11-04 LAB — RPR: RPR: NONREACTIVE

## 2015-11-04 MED ORDER — LACTATED RINGERS IV SOLN
500.0000 mL | Freq: Once | INTRAVENOUS | Status: AC
  Administered 2015-11-04: 500 mL via INTRAVENOUS

## 2015-11-04 MED ORDER — LIDOCAINE HCL (PF) 1 % IJ SOLN
INTRAMUSCULAR | Status: DC | PRN
  Administered 2015-11-04 (×2): 7 mL via EPIDURAL

## 2015-11-04 MED ORDER — PROMETHAZINE HCL 25 MG/ML IJ SOLN
12.5000 mg | Freq: Four times a day (QID) | INTRAMUSCULAR | Status: DC | PRN
  Administered 2015-11-04: 12.5 mg via INTRAVENOUS
  Filled 2015-11-04: qty 1

## 2015-11-04 MED ORDER — SENNOSIDES-DOCUSATE SODIUM 8.6-50 MG PO TABS
2.0000 | ORAL_TABLET | ORAL | Status: DC
  Administered 2015-11-04: 2 via ORAL
  Filled 2015-11-04: qty 2

## 2015-11-04 MED ORDER — ONDANSETRON HCL 4 MG PO TABS: 4.0000 mg | ORAL_TABLET | ORAL | Status: DC | PRN

## 2015-11-04 MED ORDER — TETANUS-DIPHTH-ACELL PERTUSSIS 5-2.5-18.5 LF-MCG/0.5 IM SUSP: 0.5000 mL | Freq: Once | INTRAMUSCULAR | Status: DC

## 2015-11-04 MED ORDER — EPHEDRINE 5 MG/ML INJ
10.0000 mg | INTRAVENOUS | Status: DC | PRN
  Filled 2015-11-04: qty 4

## 2015-11-04 MED ORDER — BUTORPHANOL TARTRATE 1 MG/ML IJ SOLN
2.0000 mg | INTRAMUSCULAR | Status: DC | PRN
  Administered 2015-11-04: 2 mg via INTRAVENOUS
  Filled 2015-11-04: qty 2

## 2015-11-04 MED ORDER — PHENYLEPHRINE 40 MCG/ML (10ML) SYRINGE FOR IV PUSH (FOR BLOOD PRESSURE SUPPORT)
80.0000 ug | PREFILLED_SYRINGE | INTRAVENOUS | Status: DC | PRN
  Filled 2015-11-04: qty 5
  Filled 2015-11-04: qty 10

## 2015-11-04 MED ORDER — IBUPROFEN 600 MG PO TABS
600.0000 mg | ORAL_TABLET | Freq: Four times a day (QID) | ORAL | Status: DC
  Administered 2015-11-04 – 2015-11-05 (×3): 600 mg via ORAL
  Filled 2015-11-04 (×3): qty 1

## 2015-11-04 MED ORDER — SIMETHICONE 80 MG PO CHEW: 80.0000 mg | CHEWABLE_TABLET | ORAL | Status: DC | PRN

## 2015-11-04 MED ORDER — ONDANSETRON HCL 4 MG/2ML IJ SOLN: 4.0000 mg | INTRAMUSCULAR | Status: DC | PRN

## 2015-11-04 MED ORDER — COCONUT OIL OIL: 1.0000 "application " | TOPICAL_OIL | Status: DC | PRN

## 2015-11-04 MED ORDER — BENZOCAINE-MENTHOL 20-0.5 % EX AERO: 1.0000 "application " | INHALATION_SPRAY | CUTANEOUS | Status: DC | PRN

## 2015-11-04 MED ORDER — ACETAMINOPHEN 325 MG PO TABS: 650.0000 mg | ORAL_TABLET | ORAL | Status: DC | PRN

## 2015-11-04 MED ORDER — DIPHENHYDRAMINE HCL 50 MG/ML IJ SOLN: 12.5000 mg | INTRAMUSCULAR | Status: DC | PRN

## 2015-11-04 MED ORDER — FENTANYL CITRATE (PF) 100 MCG/2ML IJ SOLN
100.0000 ug | Freq: Once | INTRAMUSCULAR | Status: AC
Start: 2015-11-04 — End: 2015-11-04
  Administered 2015-11-04: 100 ug via INTRAVENOUS
  Filled 2015-11-04: qty 2

## 2015-11-04 MED ORDER — DIBUCAINE 1 % RE OINT: 1.0000 "application " | TOPICAL_OINTMENT | RECTAL | Status: DC | PRN

## 2015-11-04 MED ORDER — WITCH HAZEL-GLYCERIN EX PADS: 1.0000 "application " | MEDICATED_PAD | CUTANEOUS | Status: DC | PRN

## 2015-11-04 MED ORDER — FENTANYL 2.5 MCG/ML BUPIVACAINE 1/10 % EPIDURAL INFUSION (WH - ANES)
14.0000 mL/h | INTRAMUSCULAR | Status: DC | PRN
  Administered 2015-11-04 (×2): 14 mL/h via EPIDURAL
  Filled 2015-11-04: qty 125

## 2015-11-04 MED ORDER — PHENYLEPHRINE 40 MCG/ML (10ML) SYRINGE FOR IV PUSH (FOR BLOOD PRESSURE SUPPORT)
80.0000 ug | PREFILLED_SYRINGE | INTRAVENOUS | Status: DC | PRN
Start: 2015-11-04 — End: 2015-11-04
  Filled 2015-11-04: qty 5

## 2015-11-04 MED ORDER — MEASLES, MUMPS & RUBELLA VAC ~~LOC~~ INJ
0.5000 mL | INJECTION | Freq: Once | SUBCUTANEOUS | Status: DC
  Filled 2015-11-04: qty 0.5

## 2015-11-04 MED ORDER — ZOLPIDEM TARTRATE 5 MG PO TABS: 5.0000 mg | ORAL_TABLET | Freq: Every evening | ORAL | Status: DC | PRN

## 2015-11-04 NOTE — Progress Notes (Signed)
Follow up visit with Toni Amendourtney and her husband after the birth of their son Rogelia RohrerLane William.  She reports after getting her epidural that her labor was really quick and she was kind of out of it until it was time to push.  She has mixed feelings about whether it was helpful to have it be so fast or not, but coping well.  She shared that they decided to hold AdamsLane and were able to recognize features from their family in his face.  I shared some local grief resources with her and she expressed interest in donating her milk.  I gave her the lactation after loss brochure with the card of Glenetta HewKim Richey on it and she was grateful.  We processed her experience and their feelings and I normalized and offered support.  The family is aware that they can continue to reach out with us for support even after discharge and that support is available this evenign and this weekend.  Please page as further needs arise.  Maryanna ShapeAmanda M. Carley Hammedavee Lomax, M.Div. [facility] Chaplain Pager [phone] Office [phone]

## 2015-11-04 NOTE — Anesthesia Procedure Notes (Signed)
Epidural Patient location during procedure: OB Start time: 11/04/2015 10:06 AM End time: 11/04/2015 10:10 AM  Staffing Anesthesiologist: Leilani AbleHATCHETT, Lalo Tromp Performed: anesthesiologist   Preanesthetic Checklist Completed: patient identified, surgical consent, pre-op evaluation, timeout performed, IV checked, risks and benefits discussed and monitors and equipment checked  Epidural Patient position: sitting Prep: site prepped and draped and DuraPrep Patient monitoring: continuous pulse ox and blood pressure Approach: midline Location: L3-L4 Injection technique: LOR air  Needle:  Needle type: Tuohy  Needle gauge: 17 G Needle length: 9 cm and 9 Needle insertion depth: 7 cm Catheter type: closed end flexible Catheter size: 19 Gauge Catheter at skin depth: 12 cm Test dose: negative and Other  Assessment Sensory level: T9 Events: blood not aspirated, injection not painful, no injection resistance, negative IV test and no paresthesia  Additional Notes Reason for block:procedure for pain

## 2015-11-04 NOTE — Progress Notes (Signed)
UR chart review completed.  

## 2015-11-04 NOTE — Anesthesia Preprocedure Evaluation (Signed)
Anesthesia Evaluation  Patient identified by MRN, date of birth, ID band Patient awake    Reviewed: Allergy & Precautions, H&P , NPO status , Patient's Chart, lab work & pertinent test results  Airway Mallampati: II  TM Distance: >3 FB Neck ROM: full    Dental no notable dental hx.    Pulmonary former smoker,    Pulmonary exam normal       Cardiovascular negative cardio ROS Normal cardiovascular exam    Neuro/Psych negative neurological ROS  negative psych ROS   GI/Hepatic negative GI ROS, Neg liver ROS,   Endo/Other  negative endocrine ROS  Renal/GU negative Renal ROS     Musculoskeletal   Abdominal (+) + obese,   Peds  Hematology negative hematology ROS (+)   Anesthesia Other Findings   Reproductive/Obstetrics (+) Pregnancy                             Anesthesia Physical Anesthesia Plan  ASA: II  Anesthesia Plan: Epidural   Post-op Pain Management:    Induction:   Airway Management Planned:   Additional Equipment:   Intra-op Plan:   Post-operative Plan:   Informed Consent: I have reviewed the patients History and Physical, chart, labs and discussed the procedure including the risks, benefits and alternatives for the proposed anesthesia with the patient or authorized representative who has indicated his/her understanding and acceptance.     Plan Discussed with:   Anesthesia Plan Comments:         Anesthesia Quick Evaluation  

## 2015-11-04 NOTE — Progress Notes (Signed)
I visited with [name]'s mother in law and another relative in the hallway.  They were tearful, but coping well.  Jill Sandoval is in a lot of pain now and is awaiting her epidural.  Mother in law reports they don't have any needs at this time.  I explained ways we can support them and she was receptive.  Our team will continue to follow.  Please page as further needs arise.  Maryanna ShapeAmanda M. Carley Hammedavee Lomax, M.Div. [facility] Chaplain Pager [phone] Office [phone]

## 2015-11-04 NOTE — Anesthesia Postprocedure Evaluation (Signed)
Anesthesia Post Note  Patient: Jill Sandoval  Procedure(s) Performed: * No procedures listed *  Patient location during evaluation: Mother Baby Anesthesia Type: Epidural Level of consciousness: awake and alert and oriented Pain management: pain level controlled Vital Signs Assessment: post-procedure vital signs reviewed and stable Respiratory status: spontaneous breathing, nonlabored ventilation and respiratory function stable Cardiovascular status: stable Postop Assessment: no headache, no backache, epidural receding, patient able to bend at knees, no signs of nausea or vomiting and adequate PO intake Anesthetic complications: no Comments: Fetal demise. Comfort measures and counsel given. Per parents, they appreciated anesthesia.     Last Vitals:  Vitals:   11/04/15 1146 11/04/15 1310  BP: (!) 100/41 (!) 105/57  Pulse: 91 97  Resp: 18 18  Temp:  37.3 C    Last Pain: 0  Pain Goal: Patients Stated Pain Goal: 1 (11/03/15 2004)               Myleka Moncure

## 2015-11-05 NOTE — Discharge Summary (Signed)
Obstetric Discharge Summary Reason for Admission: induction of labor Prenatal Procedures: ultrasound Intrapartum Procedures: spontaneous vaginal delivery Postpartum Procedures: none Complications-Operative and Postpartum: fetal demise Hemoglobin  Date Value Ref Range Status  11/03/2015 13.3 12.0 - 15.0 g/dL Final   HGB  Date Value Ref Range Status  07/28/2011 12.8 12.0 - 16.0 g/dL Final   HCT  Date Value Ref Range Status  11/03/2015 39.0 36.0 - 46.0 % Final   Hematocrit  Date Value Ref Range Status  06/30/2015 38.1 34.0 - 46.6 % Final    Physical Exam:  General: alert and cooperative Lochia: appropriate Uterine Fundus: firm  Discharge Diagnoses: IUP at 27+ weeks with a fetal demise.Most likely cord accident  Discharge Information: Date: 11/05/2015 Activity: pelvic rest Diet: routine Medications: None and Ibuprofen Condition: stable Instructions: refer to practice specific booklet Discharge to: home Follow-up Information    Levi AlandANDERSON,Montasia Chisenhall E, MD. Schedule an appointment as soon as possible for a visit in 1 month(s).   Specialty:  Obstetrics and Gynecology Contact information: [address] [place] [id number] [phone]           Newborn Data: Live born female  Birth Weight: 1 lb 14.3 oz (859 g) APGAR: 0, 0  Home with Fetal demise.  Melva Faux E 11/05/2015, 9:44 AM

## 2015-11-05 NOTE — Lactation Note (Signed)
Lactation Consultation Note  Patient Name: Jill Sandoval Today's Date: 11/05/2015   Spoke to Mom briefly as she was leaving her room to be discharged.  Introduced myself and offered to speak to her regarding any questions she may have regarding her breasts and milk coming in.  She said she spoke to someone regarding this and is interested in possibly donating her milk to a milk bank.  Lactation after Loss brochure given to her.  She stated she was going to purchase a pump.  Informed her that there was an organization that could provide her with a pump for donating her milk.  She said she would call for information as she was walking out with her family.  Mom was all smiles and seemed excited to be doing something for another baby after her loss.  Commended her for her decision and stated that the Lactation Department would be there for her for any assistance.  [name], [name] 11/05/2015, 10:34 AM

## 2015-11-05 NOTE — Progress Notes (Signed)
Pt has no complaints.She is doing well emotionally. VSSAF IMP/ Stable Plan/ Will discharge

## 2015-11-08 LAB — INFECT DISEASE AB IGM REFLEX 1

## 2015-11-08 LAB — TORCH-IGM(TOXO/ RUB/ CMV/ HSV) W TITER
CMV IgM: <30 AU/mL (ref 0.0–29.9)
HSVI/II COMB AB IGM: 1.22 ratio — AB (ref 0.00–0.90)
Rubella IgM: <20 AU/mL (ref 0.0–19.9)
Toxoplasma Antibody- IgM: <3 AU/mL (ref 0.0–7.9)

## 2016-02-28 ENCOUNTER — Other Ambulatory Visit: Payer: Self-pay | Admitting: Obstetrics and Gynecology

## 2016-02-29 LAB — CYTOLOGY - PAP

## 2016-08-25 ENCOUNTER — Encounter (HOSPITAL_COMMUNITY): Payer: Self-pay | Admitting: *Deleted

## 2016-08-25 ENCOUNTER — Inpatient Hospital Stay (HOSPITAL_COMMUNITY)
Admission: AD | Admit: 2016-08-25 | Discharge: 2016-08-26 | Disposition: A | Discharge status: Home / Self Care | Payer: Medicaid Other | Source: Ambulatory Visit | Attending: Obstetrics and Gynecology | Admitting: Obstetrics and Gynecology

## 2016-08-25 DIAGNOSIS — J45909 Unspecified asthma, uncomplicated: Secondary | ICD-10-CM | POA: Insufficient documentation

## 2016-08-25 DIAGNOSIS — Z79899 Other long term (current) drug therapy: Secondary | ICD-10-CM | POA: Insufficient documentation

## 2016-08-25 DIAGNOSIS — Z87891 Personal history of nicotine dependence: Secondary | ICD-10-CM | POA: Diagnosis not present

## 2016-08-25 DIAGNOSIS — N898 Other specified noninflammatory disorders of vagina: Secondary | ICD-10-CM | POA: Insufficient documentation

## 2016-08-25 DIAGNOSIS — O99513 Diseases of the respiratory system complicating pregnancy, third trimester: Secondary | ICD-10-CM | POA: Insufficient documentation

## 2016-08-25 DIAGNOSIS — O26893 Other specified pregnancy related conditions, third trimester: Secondary | ICD-10-CM | POA: Diagnosis not present

## 2016-08-25 DIAGNOSIS — Z3A31 31 weeks gestation of pregnancy: Secondary | ICD-10-CM | POA: Diagnosis not present

## 2016-08-25 LAB — WET PREP, GENITAL
Clue Cells Wet Prep HPF POC: NONE SEEN
SPERM: NONE SEEN
TRICH WET PREP: NONE SEEN
YEAST WET PREP: NONE SEEN

## 2016-08-25 LAB — AMNISURE RUPTURE OF MEMBRANE (ROM) NOT AT ARMC: AMNISURE: NEGATIVE

## 2016-08-25 NOTE — MAU Provider Note (Signed)
History   [id number]   Chief Complaint  Patient presents with  . Rupture of Membranes    HPI Eleonore ChiquitoCourtney A Karlen is a 26 y.o. female  373P1101 here with report of watery vaginal discharge that began approximately 1.5 weeks ago.  Reports continued wetness of underwear.  Denies contractions and vaginal bleeding.  No reported vaginal odor, pelvic pain, or fever.  +fetal movement.   All other systems negative.    No LMP recorded. Patient is pregnant.  OB History  Gravida Para Term Preterm AB Living  3 2 1 1   1   SAB TAB Ectopic Multiple Live Births        0 1    # Outcome Date GA Lbr Len/2nd Weight Sex Delivery Anes PTL Lv  3 Current           2 Preterm 11/04/15 553w2d 02:51 1 lb 14.3 oz (0.859 kg) M VBAC EPI  FD  1 Term 03/24/12 2468w0d  8 lb 9.4 oz (3.894 kg) M CS-LTranv EPI  LIV      Past Medical History:  Diagnosis Date  . Asthma    inhaler prn    Family History  Problem Relation Age of Onset  . Hypertension Maternal Grandmother   . Cancer Maternal Grandfather        prostate  . Hearing loss Brother   . Hearing loss Paternal Grandfather   . Other Neg Hx     Social History   Social History  . Marital status: Married    Spouse name: N/A  . Number of children: N/A  . Years of education: N/A   Social History Main Topics  . Smoking status: Former Smoker    Packs/day: 0.25    Types: Cigarettes    Quit date: 05/30/2015  . Smokeless tobacco: Never Used  . Alcohol use No  . Drug use: No  . Sexual activity: Yes   Other Topics Concern  . None   Social History Narrative  . None    No Known Allergies  No current facility-administered medications on file prior to encounter.    Current Outpatient Prescriptions on File Prior to Encounter  Medication Sig Dispense Refill  . albuterol (PROVENTIL HFA;VENTOLIN HFA) 108 (90 BASE) MCG/ACT inhaler Inhale 2 puffs into the lungs every 6 (six) hours as needed for wheezing or shortness of breath.     . loratadine (CLARITIN) 10  MG tablet Take 10 mg by mouth daily as needed for allergies.    . Prenatal Vit-Fe Fumarate-FA (PRENATAL MULTIVITAMIN) TABS tablet Take 1 tablet by mouth daily.        Review of Systems  Constitutional: Negative for chills and fever.  Gastrointestinal: Negative for abdominal pain, diarrhea, nausea and vomiting.  Genitourinary: Positive for vaginal discharge. Negative for dysuria, frequency, pelvic pain, vaginal bleeding and vaginal pain.  All other systems reviewed and are negative.    Physical Exam   Vitals:   08/25/16 2133  BP: 113/65  Pulse: (!) 126  Resp: 18  Temp: 98.3 F (36.8 C)  TempSrc: Oral  Weight: 250 lb (113.4 kg)  Height: 5\' 4"  (1.626 m)    Physical Exam  Constitutional: She is oriented to person, place, and time. She appears well-developed and well-nourished. No distress.  HENT:  Head: Normocephalic.  Neck: Normal range of motion. Neck supple.  Cardiovascular: Normal rate, regular rhythm and normal heart sounds.   Respiratory: Effort normal and breath sounds normal.  GI: Soft. There is no tenderness.  Genitourinary:  No tenderness or bleeding in the vagina. Vaginal discharge (mucus like discharge) found.  Genitourinary Comments: Neg pooling, neg fern  Neurological: She is alert and oriented to person, place, and time.  Skin: Skin is warm and dry.   FHR 131, +accels, neg decels, moderate variability Toco - none  MAU Course  Procedures  MDM Results for orders placed or performed during the hospital encounter of 08/25/16 (from the past 24 hour(s))  Am[facility] membrane (rom)[facility]     Status: None   Collection Time: 08/25/16 11:00 PM  Result Value Ref Range   Amnisure ROM NEGATIVE   Wet prep, genital     Status: Abnormal   Collection Time: 08/25/16 11:40 PM  Result Value Ref Range   Yeast Wet Prep HPF POC NONE SEEN NONE SEEN   Trich, Wet Prep NONE SEEN NONE SEEN   Clue Cells Wet Prep HPF POC NONE SEEN NONE SEEN   WBC, Wet Prep HPF POC  FEW (A) NONE SEEN   Sperm NONE SEEN     2355 Consulted with Dr. Henderson CloudHorvath > reviewed HPI, exam, labs > discharge home Assessment and Plan  25 y.o. G3P1101 at 6158w6d IUP Vaginal Discharge - Normal Exam Reactive NST  Plan: Discharge home Reviewed labor precautions Follow-up in office as scheduled  Marlis EdelsonKarim, Walidah N, CNM 08/26/2016 12:09 AM

## 2016-08-25 NOTE — MAU Note (Signed)
Urine sent to lab 

## 2016-08-25 NOTE — MAU Note (Signed)
Pt reports she has been noticing every day she has been having her panties are wet when she takes it off in the evening. Had some mild contraction for a few hours last night but they went away none today. gootdfetal movement reported.

## 2016-08-26 DIAGNOSIS — O26893 Other specified pregnancy related conditions, third trimester: Secondary | ICD-10-CM

## 2016-08-26 DIAGNOSIS — N898 Other specified noninflammatory disorders of vagina: Secondary | ICD-10-CM | POA: Diagnosis not present

## 2016-08-27 ENCOUNTER — Inpatient Hospital Stay (HOSPITAL_COMMUNITY)
Admission: AD | Admit: 2016-08-27 | Discharge: 2016-08-27 | Disposition: A | Discharge status: Home / Self Care | Payer: Medicaid Other | Source: Ambulatory Visit | Attending: Obstetrics | Admitting: Obstetrics

## 2016-08-27 ENCOUNTER — Encounter (HOSPITAL_COMMUNITY): Payer: Self-pay

## 2016-08-27 DIAGNOSIS — O99513 Diseases of the respiratory system complicating pregnancy, third trimester: Secondary | ICD-10-CM | POA: Insufficient documentation

## 2016-08-27 DIAGNOSIS — Z3A32 32 weeks gestation of pregnancy: Secondary | ICD-10-CM | POA: Insufficient documentation

## 2016-08-27 DIAGNOSIS — J45909 Unspecified asthma, uncomplicated: Secondary | ICD-10-CM | POA: Insufficient documentation

## 2016-08-27 DIAGNOSIS — O288 Other abnormal findings on antenatal screening of mother: Secondary | ICD-10-CM

## 2016-08-27 DIAGNOSIS — O09299 Supervision of pregnancy with other poor reproductive or obstetric history, unspecified trimester: Secondary | ICD-10-CM | POA: Diagnosis not present

## 2016-08-27 DIAGNOSIS — Z79899 Other long term (current) drug therapy: Secondary | ICD-10-CM | POA: Insufficient documentation

## 2016-08-27 DIAGNOSIS — Z87891 Personal history of nicotine dependence: Secondary | ICD-10-CM | POA: Diagnosis not present

## 2016-08-27 NOTE — MAU Note (Signed)
[facility]ark [facility].

## 2016-08-27 NOTE — MAU Provider Note (Signed)
History     CSN: [id number]  Arrival date and time: 08/27/16 1537   None     Chief Complaint  Patient [id number] with  . Non-stress Test   HPI  [name] is a 26 yo G3P1101 at 32.[redacted] wks gestation who was sent to [facility]U from [facility] for prolonged NST d/t decels vs maternal HR in office.  She has a h/o IUFD, so she receives weekly NSTs.  She denies any decreased or absent FM.  Per Dr. Chestine Sporelark - patient should have 1 hr NST and pulse ox.  Past Medical History:  Diagnosis Date  . Asthma    inhaler prn    Past Surgical History:  Procedure Laterality Date  . CESAREAN SECTION N/A 03/24/2012   Procedure: CESAREAN SECTION;  Surgeon: Mickel Baasichard D Kaplan, MD;  Location: [facility];  Service: Obstetrics;  Laterality: N/A;    Family History  Problem Relation Age of Onset  . Hypertension Maternal Grandmother   . Cancer Maternal Grandfather        prostate  . Hearing loss Brother   . Hearing loss Paternal Grandfather   . Other Neg Hx     Social History  Substance Use Topics  . Smoking status: Former Smoker    Packs/day: 0.25    Types: Cigarettes    Quit date: 05/30/2015  . Smokeless tobacco: Never Used  . Alcohol use No    Allergies: No Known Allergies  Prescriptions Prior to Admission  Medication Sig Dispense Refill Last Dose  . loratadine (CLARITIN) 10 MG tablet Take 10 mg by mouth daily as needed for allergies.   Past Month at Unknown time  . Prenatal MV-Min-FA-Omega-3 (PRENATAL GUMMIES/DHA & FA) 0.4-32.5 MG CHEW Chew 2 each by mouth daily.   08/27/2016 at Unknown time  . ranitidine (ZANTAC) 150 MG tablet Take 150 mg by mouth 2 (two) times daily as needed for heartburn.   08/27/2016 at Unknown time    Review of Systems  Constitutional: Negative.   HENT: Negative.   Eyes: Negative.   Respiratory: Negative.   Cardiovascular: Negative.   Gastrointestinal: Negative.   Endocrine: Negative.   Genitourinary: Negative.   Musculoskeletal: Negative.   Skin: Negative.    Allergic/Immunologic: Negative.   Neurological: Negative.   Hematological: Negative.   Psychiatric/Behavioral: Negative.    Physical Exam   Blood pressure 129/73, pulse (!) 119, temperature 98.4 F (36.9 C), temperature source Oral, resp. rate 16, height 5\' 4"  (1.626 m), weight 250 lb (113.4 kg).  Physical Exam  Constitutional: She is oriented to person, place, and time. She appears well-developed and well-nourished.  HENT:  Head: Normocephalic.  Eyes: Pupils are equal, round, and reactive to light.  Neck: Normal range of motion.  Respiratory: Effort normal.  GI: Soft.  Genitourinary:  Genitourinary Comments: Pelvic deferred  Musculoskeletal: Normal range of motion.  Neurological: She is alert and oriented to person, place, and time.  Skin: Skin is warm and dry.  Psychiatric: She has a normal mood and affect. Her behavior is normal. Judgment and thought content normal.    MAU Course  Procedures  MDM NST - FHR: 130 bpm / moderate variability / accels present / decels absent           TOCO: none  *Consult with Dr. Chestine Sporelark @ [phone] & [phone] - notified of patient's NST results, FHR tracing reviewed by Dr. Chestine Sporelark at time of call - ok to d/c home, keep scheduled appt next week   Assessment and Plan  NST (non-stress test) nonreactive - Category I FHR tracing at time of d/c home - Instructions on NST given - Fetal Kick Count instructions given - Keep scheduled appt next week with [facility] - Advised to call [facility] for any problems, questions or concerns - Return to MAU for emergencies  Discharge home Patient verbalized an understanding of the plan of care and agrees.  Raelyn Moraolitta Zachrey Deutscher, MSN, CNM 08/27/2016, 5:35 PM

## 2016-08-27 NOTE — Discharge Instructions (Signed)
CALL [facility] FOR ANY PROBLEMS, QUESTIONS OR CONCERNS

## 2016-09-20 ENCOUNTER — Other Ambulatory Visit: Payer: Self-pay | Admitting: Obstetrics and Gynecology

## 2016-09-24 ENCOUNTER — Inpatient Hospital Stay (HOSPITAL_COMMUNITY): Payer: Medicaid Other | Admitting: Anesthesiology

## 2016-09-24 ENCOUNTER — Encounter (HOSPITAL_COMMUNITY): Payer: Self-pay

## 2016-09-24 ENCOUNTER — Encounter (HOSPITAL_COMMUNITY)
Admission: AD | Disposition: A | Discharge status: Home / Self Care | Payer: Self-pay | Source: Ambulatory Visit | Attending: Obstetrics and Gynecology

## 2016-09-24 ENCOUNTER — Inpatient Hospital Stay (HOSPITAL_COMMUNITY)
Admission: AD | Admit: 2016-09-24 | Discharge: 2016-09-27 | DRG: 766 | Disposition: A | Discharge status: Home / Self Care | Payer: Medicaid Other | Source: Ambulatory Visit | Attending: Obstetrics and Gynecology | Admitting: Obstetrics and Gynecology

## 2016-09-24 DIAGNOSIS — J45909 Unspecified asthma, uncomplicated: Secondary | ICD-10-CM | POA: Diagnosis present

## 2016-09-24 DIAGNOSIS — O321XX0 Maternal care for breech presentation, not applicable or unspecified: Secondary | ICD-10-CM | POA: Diagnosis present

## 2016-09-24 DIAGNOSIS — O368130 Decreased fetal movements, third trimester, not applicable or unspecified: Secondary | ICD-10-CM | POA: Diagnosis present

## 2016-09-24 DIAGNOSIS — Z3A36 36 weeks gestation of pregnancy: Secondary | ICD-10-CM | POA: Diagnosis not present

## 2016-09-24 DIAGNOSIS — Z87891 Personal history of nicotine dependence: Secondary | ICD-10-CM | POA: Diagnosis not present

## 2016-09-24 DIAGNOSIS — O9952 Diseases of the respiratory system complicating childbirth: Secondary | ICD-10-CM | POA: Diagnosis present

## 2016-09-24 DIAGNOSIS — O34211 Maternal care for low transverse scar from previous cesarean delivery: Secondary | ICD-10-CM | POA: Diagnosis present

## 2016-09-24 LAB — WET PREP, GENITAL
CLUE CELLS WET PREP: NONE SEEN
Sperm: NONE SEEN
Trich, Wet Prep: NONE SEEN
Yeast Wet Prep HPF POC: NONE SEEN

## 2016-09-24 LAB — CBC
HCT: 33.6 % — ABNORMAL LOW (ref 36.0–46.0)
HEMOGLOBIN: 11.1 g/dL — AB (ref 12.0–15.0)
MCH: 27.8 pg (ref 26.0–34.0)
MCHC: 33 g/dL (ref 30.0–36.0)
MCV: 84 fL (ref 78.0–100.0)
PLATELETS: 260 10*3/uL (ref 150–400)
RBC: 4 MIL/uL (ref 3.87–5.11)
RDW: 15.7 % — ABNORMAL HIGH (ref 11.5–15.5)
WBC: 12.6 10*3/uL — AB (ref 4.0–10.5)

## 2016-09-24 LAB — TYPE AND SCREEN
ABO/RH(D): O POS
Antibody Screen: NEGATIVE

## 2016-09-24 LAB — AMNISURE RUPTURE OF MEMBRANE (ROM) NOT AT ARMC: AMNISURE: NEGATIVE

## 2016-09-24 SURGERY — Surgical Case
Anesthesia: Epidural | Site: Abdomen

## 2016-09-24 MED ORDER — SIMETHICONE 80 MG PO CHEW
80.0000 mg | CHEWABLE_TABLET | ORAL | Status: DC
  Administered 2016-09-24 – 2016-09-26 (×3): 80 mg via ORAL
  Filled 2016-09-24 (×3): qty 1

## 2016-09-24 MED ORDER — PHENYLEPHRINE 8 MG IN D5W 100 ML (0.08MG/ML) PREMIX OPTIME
INJECTION | INTRAVENOUS | Status: DC | PRN
  Administered 2016-09-24: 60 ug/min via INTRAVENOUS

## 2016-09-24 MED ORDER — CEFAZOLIN SODIUM-DEXTROSE 2-3 GM-% IV SOLR
INTRAVENOUS | Status: DC | PRN
  Administered 2016-09-24: 2 g via INTRAVENOUS

## 2016-09-24 MED ORDER — LACTATED RINGERS IV SOLN
INTRAVENOUS | Status: DC | PRN
  Administered 2016-09-24: 20:00:00 via INTRAVENOUS

## 2016-09-24 MED ORDER — FENTANYL CITRATE (PF) 100 MCG/2ML IJ SOLN
INTRAMUSCULAR | Status: AC
  Filled 2016-09-24: qty 2

## 2016-09-24 MED ORDER — FENTANYL CITRATE (PF) 100 MCG/2ML IJ SOLN
25.0000 ug | INTRAMUSCULAR | Status: DC | PRN
  Administered 2016-09-24: 25 ug via INTRAVENOUS

## 2016-09-24 MED ORDER — FENTANYL CITRATE (PF) 100 MCG/2ML IJ SOLN
INTRAMUSCULAR | Status: AC
  Administered 2016-09-24: 25 ug via INTRAVENOUS
  Filled 2016-09-24: qty 2

## 2016-09-24 MED ORDER — SOD CITRATE-CITRIC ACID 500-334 MG/5ML PO SOLN
30.0000 mL | Freq: Once | ORAL | Status: AC
  Administered 2016-09-24: 30 mL via ORAL
  Filled 2016-09-24: qty 15

## 2016-09-24 MED ORDER — PRENATAL MULTIVITAMIN CH
1.0000 | ORAL_TABLET | Freq: Every day | ORAL | Status: DC
  Administered 2016-09-25 – 2016-09-26 (×2): 1 via ORAL
  Filled 2016-09-24 (×2): qty 1

## 2016-09-24 MED ORDER — SIMETHICONE 80 MG PO CHEW
80.0000 mg | CHEWABLE_TABLET | Freq: Three times a day (TID) | ORAL | Status: DC
  Administered 2016-09-25 – 2016-09-26 (×6): 80 mg via ORAL
  Filled 2016-09-24 (×6): qty 1

## 2016-09-24 MED ORDER — MENTHOL 3 MG MT LOZG: 1.0000 | LOZENGE | OROMUCOSAL | Status: DC | PRN

## 2016-09-24 MED ORDER — OXYTOCIN 10 UNIT/ML IJ SOLN
INTRAMUSCULAR | Status: AC
  Filled 2016-09-24: qty 4

## 2016-09-24 MED ORDER — TETANUS-DIPHTH-ACELL PERTUSSIS 5-2.5-18.5 LF-MCG/0.5 IM SUSP: 0.5000 mL | Freq: Once | INTRAMUSCULAR | Status: DC

## 2016-09-24 MED ORDER — LACTATED RINGERS IV SOLN
INTRAVENOUS | Status: DC
  Administered 2016-09-24 (×2): via INTRAVENOUS

## 2016-09-24 MED ORDER — MORPHINE SULFATE (PF) 0.5 MG/ML IJ SOLN
INTRAMUSCULAR | Status: AC
  Filled 2016-09-24: qty 10

## 2016-09-24 MED ORDER — SCOPOLAMINE 1 MG/3DAYS TD PT72
MEDICATED_PATCH | TRANSDERMAL | Status: DC | PRN
  Administered 2016-09-24: 1 via TRANSDERMAL

## 2016-09-24 MED ORDER — DEXAMETHASONE SODIUM PHOSPHATE 4 MG/ML IJ SOLN
INTRAMUSCULAR | Status: DC | PRN
  Administered 2016-09-24: 4 mg via INTRAVENOUS

## 2016-09-24 MED ORDER — ACETAMINOPHEN 325 MG PO TABS
650.0000 mg | ORAL_TABLET | ORAL | Status: DC | PRN
  Administered 2016-09-25: 650 mg via ORAL
  Filled 2016-09-24: qty 2

## 2016-09-24 MED ORDER — ACETAMINOPHEN 160 MG/5ML PO SOLN: 975.0000 mg | Freq: Once | ORAL | Status: DC

## 2016-09-24 MED ORDER — ONDANSETRON HCL 4 MG/2ML IJ SOLN
INTRAMUSCULAR | Status: AC
  Filled 2016-09-24: qty 2

## 2016-09-24 MED ORDER — METOCLOPRAMIDE HCL 5 MG/ML IJ SOLN
INTRAMUSCULAR | Status: DC | PRN
  Administered 2016-09-24: 10 mg via INTRAVENOUS

## 2016-09-24 MED ORDER — LACTATED RINGERS IV SOLN: INTRAVENOUS | Status: DC

## 2016-09-24 MED ORDER — SODIUM CHLORIDE 0.9 % IV SOLN
INTRAVENOUS | Status: DC
  Administered 2016-09-24: 11:00:00 via INTRAVENOUS

## 2016-09-24 MED ORDER — BUPIVACAINE IN DEXTROSE 0.75-8.25 % IT SOLN
INTRATHECAL | Status: AC
  Filled 2016-09-24: qty 2

## 2016-09-24 MED ORDER — DEXAMETHASONE SODIUM PHOSPHATE 4 MG/ML IJ SOLN
INTRAMUSCULAR | Status: AC
  Filled 2016-09-24: qty 1

## 2016-09-24 MED ORDER — ZOLPIDEM TARTRATE 5 MG PO TABS: 5.0000 mg | ORAL_TABLET | Freq: Every evening | ORAL | Status: DC | PRN

## 2016-09-24 MED ORDER — OXYTOCIN 10 UNIT/ML IJ SOLN
INTRAMUSCULAR | Status: DC | PRN
  Administered 2016-09-24: 40 [IU] via INTRAVENOUS

## 2016-09-24 MED ORDER — METHYLERGONOVINE MALEATE 0.2 MG/ML IJ SOLN: 0.2000 mg | INTRAMUSCULAR | Status: DC | PRN

## 2016-09-24 MED ORDER — COCONUT OIL OIL: 1.0000 "application " | TOPICAL_OIL | Status: DC | PRN

## 2016-09-24 MED ORDER — ONDANSETRON HCL 4 MG/2ML IJ SOLN
INTRAMUSCULAR | Status: DC | PRN
  Administered 2016-09-24: 4 mg via INTRAVENOUS

## 2016-09-24 MED ORDER — CEFAZOLIN SODIUM-DEXTROSE 2-4 GM/100ML-% IV SOLN
2.0000 g | INTRAVENOUS | Status: DC
  Filled 2016-09-24: qty 100

## 2016-09-24 MED ORDER — OXYCODONE-ACETAMINOPHEN 5-325 MG PO TABS
1.0000 | ORAL_TABLET | ORAL | Status: DC | PRN
  Administered 2016-09-25 – 2016-09-26 (×5): 1 via ORAL
  Filled 2016-09-24 (×5): qty 1

## 2016-09-24 MED ORDER — OXYTOCIN 40 UNITS IN LACTATED RINGERS INFUSION - SIMPLE MED: 2.5000 [IU]/h | INTRAVENOUS | Status: AC

## 2016-09-24 MED ORDER — LACTATED RINGERS IV BOLUS (SEPSIS): 1000.0000 mL | Freq: Once | INTRAVENOUS | Status: DC

## 2016-09-24 MED ORDER — OXYCODONE-ACETAMINOPHEN 5-325 MG PO TABS: 2.0000 | ORAL_TABLET | ORAL | Status: DC | PRN

## 2016-09-24 MED ORDER — DIBUCAINE 1 % RE OINT: 1.0000 "application " | TOPICAL_OINTMENT | RECTAL | Status: DC | PRN

## 2016-09-24 MED ORDER — SENNOSIDES-DOCUSATE SODIUM 8.6-50 MG PO TABS
2.0000 | ORAL_TABLET | ORAL | Status: DC
  Administered 2016-09-24 – 2016-09-26 (×3): 2 via ORAL
  Filled 2016-09-24 (×3): qty 2

## 2016-09-24 MED ORDER — METHYLERGONOVINE MALEATE 0.2 MG PO TABS: 0.2000 mg | ORAL_TABLET | ORAL | Status: DC | PRN

## 2016-09-24 MED ORDER — PHENYLEPHRINE 8 MG IN D5W 100 ML (0.08MG/ML) PREMIX OPTIME
INJECTION | INTRAVENOUS | Status: AC
  Filled 2016-09-24: qty 100

## 2016-09-24 MED ORDER — DIPHENHYDRAMINE HCL 25 MG PO CAPS: 25.0000 mg | ORAL_CAPSULE | Freq: Four times a day (QID) | ORAL | Status: DC | PRN

## 2016-09-24 MED ORDER — IBUPROFEN 600 MG PO TABS
600.0000 mg | ORAL_TABLET | Freq: Four times a day (QID) | ORAL | Status: DC
  Administered 2016-09-24 – 2016-09-27 (×10): 600 mg via ORAL
  Filled 2016-09-24 (×10): qty 1

## 2016-09-24 MED ORDER — METOCLOPRAMIDE HCL 5 MG/ML IJ SOLN
INTRAMUSCULAR | Status: AC
  Filled 2016-09-24: qty 2

## 2016-09-24 MED ORDER — SCOPOLAMINE 1 MG/3DAYS TD PT72
MEDICATED_PATCH | TRANSDERMAL | Status: AC
  Filled 2016-09-24: qty 1

## 2016-09-24 MED ORDER — FAMOTIDINE IN NACL 20-0.9 MG/50ML-% IV SOLN
20.0000 mg | Freq: Once | INTRAVENOUS | Status: AC
  Administered 2016-09-24: 20 mg via INTRAVENOUS
  Filled 2016-09-24: qty 50

## 2016-09-24 MED ORDER — FENTANYL CITRATE (PF) 100 MCG/2ML IJ SOLN
INTRAMUSCULAR | Status: DC | PRN
  Administered 2016-09-24: 75 ug via INTRAVENOUS

## 2016-09-24 MED ORDER — WITCH HAZEL-GLYCERIN EX PADS: 1.0000 "application " | MEDICATED_PAD | CUTANEOUS | Status: DC | PRN

## 2016-09-24 MED ORDER — SIMETHICONE 80 MG PO CHEW: 80.0000 mg | CHEWABLE_TABLET | ORAL | Status: DC | PRN

## 2016-09-24 SURGICAL SUPPLY — 32 items
CHLORAPREP W/TINT 26ML (MISCELLANEOUS) ×2 IMPLANT
CLAMP CORD UMBIL (MISCELLANEOUS) IMPLANT
CLOTH BEACON ORANGE TIMEOUT ST (SAFETY) ×2 IMPLANT
DERMABOND ADHESIVE PROPEN (GAUZE/BANDAGES/DRESSINGS) ×1
DERMABOND ADVANCED (GAUZE/BANDAGES/DRESSINGS)
DERMABOND ADVANCED .7 DNX12 (GAUZE/BANDAGES/DRESSINGS) IMPLANT
DERMABOND ADVANCED .7 DNX6 (GAUZE/BANDAGES/DRESSINGS) ×1 IMPLANT
DRSG OPSITE POSTOP 4X10 (GAUZE/BANDAGES/DRESSINGS) ×2 IMPLANT
ELECT REM PT RETURN 9FT ADLT (ELECTROSURGICAL) ×2
ELECTRODE REM PT RTRN 9FT ADLT (ELECTROSURGICAL) ×1 IMPLANT
EXTRACTOR VACUUM M CUP 4 TUBE (SUCTIONS) IMPLANT
GLOVE BIO SURGEON STRL SZ7 (GLOVE) ×2 IMPLANT
GLOVE BIOGEL PI IND STRL 7.0 (GLOVE) ×1 IMPLANT
GLOVE BIOGEL PI INDICATOR 7.0 (GLOVE) ×1
GOWN STRL REUS W/TWL LRG LVL3 (GOWN DISPOSABLE) ×4 IMPLANT
KIT ABG SYR 3ML LUER SLIP (SYRINGE) IMPLANT
NEEDLE HYPO 22GX1.5 SAFETY (NEEDLE) IMPLANT
NEEDLE HYPO 25X5/8 SAFETYGLIDE (NEEDLE) IMPLANT
NS IRRIG 1000ML POUR BTL (IV SOLUTION) ×2 IMPLANT
PACK C SECTION WH (CUSTOM PROCEDURE TRAY) ×2 IMPLANT
PAD OB MATERNITY 4.3X12.25 (PERSONAL CARE ITEMS) ×2 IMPLANT
PENCIL SMOKE EVAC W/HOLSTER (ELECTROSURGICAL) ×2 IMPLANT
RTRCTR C-SECT PINK 25CM LRG (MISCELLANEOUS) ×2 IMPLANT
SUT CHROMIC 1 CTX 36 (SUTURE) ×4 IMPLANT
SUT CHROMIC 2 0 CT 1 (SUTURE) ×2 IMPLANT
SUT PDS AB 0 CTX 60 (SUTURE) ×2 IMPLANT
SUT VIC AB 2-0 CT1 27 (SUTURE) ×1
SUT VIC AB 2-0 CT1 TAPERPNT 27 (SUTURE) ×1 IMPLANT
SUT VIC AB 4-0 KS 27 (SUTURE) ×2 IMPLANT
SYR 30ML LL (SYRINGE) IMPLANT
TOWEL OR 17X24 6PK STRL BLUE (TOWEL DISPOSABLE) ×2 IMPLANT
TRAY FOLEY BAG SILVER LF 14FR (SET/KITS/TRAYS/PACK) ×4 IMPLANT

## 2016-09-24 NOTE — Anesthesia Preprocedure Evaluation (Signed)
Anesthesia Evaluation  Patient identified by MRN, date of birth, ID band Patient awake    Reviewed: Allergy & Precautions, H&P , Patient's Chart, lab work & pertinent test results  Airway Mallampati: IV  TM Distance: >3 FB Neck ROM: full  Mouth opening: Limited Mouth Opening  Dental no notable dental hx.    Pulmonary former smoker,    Pulmonary exam normal breath sounds clear to auscultation       Cardiovascular Exercise Tolerance: Good  Rhythm:regular Rate:Normal     Neuro/Psych    GI/Hepatic   Endo/Other    Renal/GU      Musculoskeletal   Abdominal   Peds  Hematology   Anesthesia Other Findings   Reproductive/Obstetrics                             Anesthesia Physical Anesthesia Plan  ASA: III  Anesthesia Plan: Spinal, Combined Spinal and Epidural and Epidural   Post-op Pain Management:    Induction:   PONV Risk Score and Plan:   Airway Management Planned:   Additional Equipment:   Intra-op Plan:   Post-operative Plan:   Informed Consent: I have reviewed the patients History and Physical, chart, labs and discussed the procedure including the risks, benefits and alternatives for the proposed anesthesia with the patient or authorized representative who has indicated his/her understanding and acceptance.     Plan Discussed with:   Anesthesia Plan Comments: (  )        Anesthesia Quick Evaluation

## 2016-09-24 NOTE — H&P (Signed)
Jill Sandoval is a 26 y.o. female presenting for painful contractions  26 yo G3P1101 @ 36+0 presents for painful contractions and is found to be in labor. The patient changed her cervix from 1/th/-2 to 2-3/70/-2. She has a history of a prior cesarean for failure to progress. She desires repeat cesarean. She has a history of prior 27 week IUFD OB History    Gravida Para Term Preterm AB Living   3 2 1 1   1    SAB TAB Ectopic Multiple Live Births         0 1     Past Medical History:  Diagnosis Date  . Asthma    inhaler prn   Past Surgical History:  Procedure Laterality Date  . CESAREAN SECTION N/A 03/24/2012   Procedure: CESAREAN SECTION;  Surgeon: Mickel Baas, MD;  Location: [facility];  Service: Obstetrics;  Laterality: N/A;   Family History: family history includes Cancer in her maternal grandfather; Hearing loss in her brother and paternal grandfather; Hypertension in her maternal grandmother. Social History:  reports that she quit smoking about 15 months ago. Her smoking use included Cigarettes. She smoked 0.25 packs per day. She has never used smokeless tobacco. She reports that she does not drink alcohol or use drugs.     Maternal Diabetes: No Genetic Screening: Normal Maternal Ultrasounds/Referrals: Normal Fetal Ultrasounds or other Referrals:  None Maternal Substance Abuse:  No Significant Maternal Medications:  None Significant Maternal Lab Results:  None Other Comments:  None  ROS History Dilation: 1 Effacement (%): 20 Station: -3 Exam by:: Ginnie Smart RN Blood pressure 119/71, pulse (!) 114, temperature (!) 97.5 F (36.4 C), resp. rate 18. Exam Physical Exam  Prenatal labs: ABO, Rh: --/--/O POS (10/05 2043) Antibody: NEG (10/05 2043) Rubella: <20.0 (10/06 1251) RPR: Non Reactive (10/05 1800)  HBsAg:   NR HIV:   NR GBS:   neg  Assessment/Plan: 1) Admit 2) Proceed with cesarean. R/B/A reviewed with the patient and she wishes to proceed 3) SCDs   4) Ancef OCTOR   Basil Buffin H. 09/24/2016, 5:57 PM

## 2016-09-24 NOTE — Anesthesia Procedure Notes (Signed)
Spinal  Patient location during procedure: OR Staffing Anesthesiologist: Cristela Blue Preanesthetic Checklist Completed: patient identified, site marked, surgical consent, pre-op evaluation, timeout performed, IV checked, risks and benefits discussed and monitors and equipment checked Spinal Block Patient position: sitting Prep: DuraPrep Patient monitoring: cardiac monitor, continuous pulse ox, blood pressure and heart rate Approach: midline Location: L3-4 Injection technique: catheter Needle Needle type: Tuohy and Sprotte  Needle gauge: 24 G Needle length: 12.7 cm Needle insertion depth: 7 cm Catheter type: closed end flexible Catheter size: 19 g Catheter at skin depth: 13 cm Additional Notes Spinal Dosage in OR  .75% Bupivicaine ml       1.5     [id number]   mcg        100    Fentanyl mcg            25

## 2016-09-24 NOTE — MAU Note (Addendum)
Pt has been having ctx since midnight and have been getting worse. Seem to be about 5 minutes apart. Also noticed decreased fetal movement. Has had some watery discharge. Previous loss with last pregnancy.

## 2016-09-24 NOTE — MAU Provider Note (Signed)
Patient Jill Sandoval is a 26 y.o. G3P1101 At [redacted]w[redacted]d here with compalints of contractions that started last night at 10 pm. She is also endorses some vaginal leaking over the past two days, but denies sudden gushes of fluid. She reports that she also hasnt' felt the baby move this morning. She is here to get checked out because she had an IUFD at 27 weeks and wants to make sure everything is ok.    History     CSN: [id number]  Arrival date and time: 09/24/16 4098   First Provider Initiated Contact with Patient 09/24/16 1001      Chief Complaint  Patient presents with  . Decreased Fetal Movement  . Contractions   Abdominal Pain  This is a new problem. The current episode started yesterday. The onset quality is sudden. The problem occurs intermittently. The problem has been unchanged. The pain is located in the suprapubic region. The pain is at a severity of 3/10. The quality of the pain is cramping. The abdominal pain does not radiate. Pertinent negatives include no constipation, diarrhea or vomiting. Nothing aggravates the pain. The pain is relieved by nothing. She has tried nothing for the symptoms.  Vaginal Discharge  The patient's primary symptoms include vaginal discharge. The patient's pertinent negatives include no genital itching, genital lesions or genital odor. This is a new problem. The current episode started in the past 7 days. The problem occurs intermittently. The problem has been unchanged. Associated symptoms include abdominal pain. Pertinent negatives include no constipation, diarrhea or vomiting. The vaginal discharge was watery and mucoid.    OB History    Gravida Para Term Preterm AB Living   3 2 1 1   1    SAB TAB Ectopic Multiple Live Births         0 1      Past Medical History:  Diagnosis Date  . Asthma    inhaler prn    Past Surgical History:  Procedure Laterality Date  . CESAREAN SECTION N/A 03/24/2012   Procedure: CESAREAN SECTION;  Surgeon: Mickel Baas, MD;  Location: [facility];  Service: Obstetrics;  Laterality: N/A;    Family History  Problem Relation Age of Onset  . Hypertension Maternal Grandmother   . Cancer Maternal Grandfather        prostate  . Hearing loss Brother   . Hearing loss Paternal Grandfather   . Other Neg Hx     Social History  Substance Use Topics  . Smoking status: Former Smoker    Packs/day: 0.25    Types: Cigarettes    Quit date: 05/30/2015  . Smokeless tobacco: Never Used  . Alcohol use No    Allergies: No Known Allergies  Prescriptions Prior to Admission  Medication Sig Dispense Refill Last Dose  . loratadine (CLARITIN) 10 MG tablet Take 10 mg by mouth daily as needed for allergies.   Past Month at Unknown time  . Prenatal MV-Min-FA-Omega-3 (PRENATAL GUMMIES/DHA & FA) 0.4-32.5 MG CHEW Chew 2 each by mouth daily.   08/27/2016 at Unknown time  . ranitidine (ZANTAC) 150 MG tablet Take 150 mg by mouth 2 (two) times daily as needed for heartburn.   08/27/2016 at Unknown time    Review of Systems  HENT: Negative.   Respiratory: Negative.   Cardiovascular: Negative.   Gastrointestinal: Positive for abdominal pain. Negative for constipation, diarrhea and vomiting.  Genitourinary: Positive for vaginal discharge.  Musculoskeletal: Negative.   Neurological: Negative.    Physical  Exam   Blood pressure 119/71, pulse (!) 114, temperature (!) 97.5 F (36.4 C), resp. rate 18.  Physical Exam  Constitutional: She is oriented to person, place, and time. She appears well-developed and well-nourished.  HENT:  Head: Normocephalic.  Eyes: Pupils are equal, round, and reactive to light.  Neck: Normal range of motion.  Respiratory: Effort normal.  GI: Soft. Bowel sounds are normal.  Genitourinary:  Genitourinary Comments: NEFG; no pooling, Cervix is 1 cm, thick and posterior.   Musculoskeletal: Normal range of motion.  Neurological: She is alert and oriented to person, place, and time.  Skin: Skin is warm  and dry.  Psychiatric: She has a normal mood and affect.    MAU Course  Procedures  MDM -amnisure negative; cervix is unchanged after 1 hour of monitoring.  -NST: 150 bpm, mod variability, neg decels, present acels, ctx q 4 min.  -patient does not seem to be uncomfortable; will walk for an hour and reassess -last had food at 830 am -Patient is unchanged after 1 hour of walking; feeling like her contractions are stronger. Now has decided she would like a c-section.   -Dr. Tenny Craw notified, and plans to perform the operation this evening.   Assessment and Plan  Patient to be admitted for elective repeat c-section.   Charlesetta Garibaldi Puneet Selden 09/24/2016, 10:07 AM

## 2016-09-24 NOTE — MAU Note (Signed)
Pt to OR.

## 2016-09-24 NOTE — Transfer of Care (Signed)
Immediate Anesthesia Transfer of Care Note  Patient: Jill Sandoval  Procedure(s) Performed: Procedure(s): CESAREAN SECTION (N/A)  Patient Location: PACU  Anesthesia Type:Spinal and Epidural  Level of Consciousness: awake, alert  and oriented  Airway & Oxygen Therapy: Patient Spontanous Breathing  Post-op Assessment: Report given to RN and Post -op Vital signs reviewed and stable  Post vital signs: Reviewed and stable  Last Vitals:  Vitals:   09/24/16 0948 09/24/16 0949  BP: 119/71   Pulse: (!) 114   Resp:  18  Temp:  (!) 36.4 C    Last Pain:  Vitals:   09/24/16 1116  PainSc: 4          Complications: No apparent anesthesia complications

## 2016-09-24 NOTE — Op Note (Signed)
Pre-Operative Diagnosis: 1) 36+0 week intrauterine pregnancy 2) Labor 3) History of prior cesarean section, declines trial of labor Postoperative Diagnosis:  1) 36+0 week intrauterine pregnancy 2) Labor 3) History of prior cesarean section, declines trial of labor 4) Frank breech presentation Procedure: Repeat low transverse cesarean section Surgeon: Dr. Waynard Reeds Assistant: None Operative Findings: Vigorous female infant in the frank breech presentation weighing 7lb 3.7 oz, 3280 gm. Apgar scores were not documented at the time of the operative report. Normal ovaries and tubes Specimen: Placenta for disposal EBL: Total I/O In: 2000 [I.V.:2000] Out: 884 [Urine:150; Blood:734]   Procedure:Ms. Gowdy is an 26 year old gravida 3 para 1101 at 79 weeks and 0 days estimated gestational age who presents for cesarean section. Please see the patient's admission history and physical for complete details of her history. Following the appropriate informed consent the patient was brought to the operating room where spinal anesthesia was administered and found to be adequate. She was placed in the dorsal supine position with a leftward tilt. She was prepped and draped in the normal sterile fashion. The patient was appropriately identified during a preoperative timeout procedure. The Scalpel was then used to make a Pfannenstiel skin incision which was carried down to the underlying layers of soft tissue to the fascia. The fascia was incised in the midline and the fascial incision was extended laterally with Mayo scissors. The superior aspect of the fascial incision was grasped with Coker clamps x2, tented up and the rectus muscles dissected off sharply with the electrocautery unit area and the same procedure was repeated on the inferior aspect of the fascial incision. The rectus muscles were separated in the midline. The abdominal peritoneum was identified, tented up, entered sharply, and the incision was extended  superiorly and inferiorly with good visualization of the bladder. The Alexis retractor was then deployed. The vesicouterine peritoneum was identified, tented up, entered sharply, and the bladder flap was created digitally. Scalpel was then used to make a low transverse incision on the uterus which was extended laterally with blunt dissection. The fetal breech was identified, delivered easily through the uterine incision followed by the body and head. The infant was bulb suctioned on the operative field cried vigorously, cord was clamped and cut after a 1 minute delay, and the infant was passed to the waiting neonatology team. The Placenta was then delivered spontaneously, the uterus was cleared of all clot and debris. The uterine incision was repaired with #1 chromic in running locked fashion followed by a second imbricating layer. Ovaries and tubes were inspected and normal. The Alexis retractor was removed. The abdominal peritoneum was reapproximated with 2-0 Vicryl in a running fashion, the rectus muscles was reapproximated with 2-0 chromic in a running fashion. The fascia was closed with a 0 looped PDS in a running fashion. The skin was closed with 4-0 vicryl in a subcuticular fashion and Dermabond. All sponge lap and needle counts were correct x3. Patient tolerated the procedure well and recovered in stable condition following the procedure.

## 2016-09-25 ENCOUNTER — Encounter (HOSPITAL_COMMUNITY): Payer: Self-pay | Admitting: *Deleted

## 2016-09-25 LAB — CBC
HCT: 31.3 % — ABNORMAL LOW (ref 36.0–46.0)
Hemoglobin: 10.5 g/dL — ABNORMAL LOW (ref 12.0–15.0)
MCH: 27.9 pg (ref 26.0–34.0)
MCHC: 33.5 g/dL (ref 30.0–36.0)
MCV: 83.2 fL (ref 78.0–100.0)
PLATELETS: 264 10*3/uL (ref 150–400)
RBC: 3.76 MIL/uL — ABNORMAL LOW (ref 3.87–5.11)
RDW: 15.8 % — AB (ref 11.5–15.5)
WBC: 14 10*3/uL — AB (ref 4.0–10.5)

## 2016-09-25 LAB — RPR: RPR: NONREACTIVE

## 2016-09-25 NOTE — Anesthesia Postprocedure Evaluation (Signed)
Anesthesia Post Note  Patient: Jill Sandoval  Procedure(s) Performed: Procedure(s) (LRB): CESAREAN SECTION (N/A)     Patient location during evaluation: [facility] Anesthesia Type: Spinal Level of consciousness: awake and alert and oriented Pain management: pain level controlled Vital Signs Assessment: post-procedure vital signs reviewed and stable Respiratory status: spontaneous breathing and nonlabored ventilation Cardiovascular status: stable Postop Assessment: no headache, patient able to bend at knees, no backache, no signs of nausea or vomiting, spinal receding and adequate PO intake Anesthetic complications: no    Last Vitals:  Vitals:   09/25/16 0318 09/25/16 0741  BP: (!) 109/54 (!) 108/52  Pulse: 72 77  Resp: 18 18  Temp: (!) 36.4 C 36.5 C  SpO2: 99% 98%    Last Pain:  Vitals:   09/25/16 0832  TempSrc:   PainSc: 0-No pain   Pain Goal:                 [employer]

## 2016-09-25 NOTE — Lactation Note (Signed)
This note was copied from a baby's chart. Lactation Consultation Note  Patient Name: Jill Sandoval [id number] Date: 09/25/2016 Reason for consult: Initial assessment;NICU baby;Late-preterm 34-36.6wks   Initial assessment with mom of 16 hour old LPT NICU infant. Mom reports she is pumping every 2-3 hours and obtaining small amounts of colostrum. Mom is taking colostrum to infant in NICU.   Mom reports she had difficulty latching her son to the breast and had to use a NS, she BF for about a month. She had a fetal demise and pumped for 1 month and donated her breast milk to a friend.   Providing Milk for Your Baby in NICU Booklet given. Reviewed pumping every 2-3 hours for 15 minutes on Initate setting. Worked with mom on hand expression and she was able to return demo. Large gtts colostrum were noted. Mom stopped pretty soon as she had visitors. Reviewed breast milk storage and labeling for the NICU infant.   Mom with large compressible breasts with nipples that invaginate when at rest, they invert further with areolar compression.   BF Resources handout and LC Brochure given, mom informed of IP/OP Services, BF Support Groups and LC phone #. Mom is a [facility] client and was asking about a pump for home. [facility] loaner program reviewed. WIC referral faxed to [facility] office at moms request.   Mom with support of her mother in the room. Reviewed assembling, disassembling and cleaning of pump parts with family. Mom with further questions/concerns at this time.    Maternal Data Formula Feeding for Exclusion: No Has patient been taught Hand Expression?: Yes Does the patient have breastfeeding experience prior to this delivery?: Yes  Feeding    LATCH Score                   Interventions Interventions: Hand express;DEBP  Lactation Tools Discussed/Used WIC Program: Yes Pump Review: Setup, frequency, and cleaning;Milk Storage Initiated by:: Reviewed and encouraged every 2-3  hours Date initiated:: 09/24/16   Consult Status Consult Status: Follow-up Date: 09/26/16 Follow-up type: In-patient    [name] [name] 09/25/2016, 12:16 PM

## 2016-09-25 NOTE — Progress Notes (Signed)
Patient is eating, ambulating, voiding.  Pain control is good.  Appropriate lochia, no complaints.  Vitals:   09/24/16 2215 09/24/16 2340 09/25/16 0318 09/25/16 0741  BP: (!) 129/56 (!) 104/48 (!) 109/54 (!) 108/52  Pulse: 88 81 72 77  Resp: 17 17 18 18   Temp: 98.4 F (36.9 C) 98.3 F (36.8 C) (!) 97.5 F (36.4 C) 97.7 F (36.5 C)  TempSrc: Oral Oral Oral Oral  SpO2:  97% 99% 98%  Weight:      Height:        Fundus firm Perineum without swelling. Inc: c/d/i Ext: no CT  Lab Results  Component Value Date   WBC 14.0 (H) 09/25/2016   HGB 10.5 (L) 09/25/2016   HCT 31.3 (L) 09/25/2016   MCV 83.2 09/25/2016   PLT 264 09/25/2016    --/--/O POS (08/27 1808)  A/P Post op day #1 s/p c/s- repeat, breech Doing well, encouraged ambulation.  Routine care.  Expect d/c 8/29.    Jill Sandoval

## 2016-09-26 ENCOUNTER — Encounter (HOSPITAL_COMMUNITY): Payer: Self-pay | Admitting: Obstetrics and Gynecology

## 2016-09-26 LAB — BIRTH TISSUE RECOVERY COLLECTION (PLACENTA DONATION)

## 2016-09-26 NOTE — Progress Notes (Signed)
Subjective: Postpartum Day 2: Cesarean Delivery Patient reports incisional pain, tolerating PO, + flatus and no problems voiding.    Objective: Vital signs in last 24 hours: Temp:  [97.7 F (36.5 C)-98.2 F (36.8 C)] 97.8 F (36.6 C) (08/29 0821) Pulse Rate:  [64-102] 64 (08/29 0821) Resp:  [16-18] 16 (08/29 0821) BP: (94-123)/(55-66) 123/66 (08/29 0821) SpO2:  [97 %-100 %] 100 % (08/29 16100821)  Physical Exam:  General: alert, cooperative and no distress Lochia: appropriate Uterine Fundus: firm Incision: healing well, no significant drainage, no dehiscence, no significant erythema DVT Evaluation: No evidence of DVT seen on physical exam. Negative Homan's sign. No cords or calf tenderness. No significant calf/ankle edema.   Recent Labs  09/24/16 1808 09/25/16 0454  HGB 11.1* 10.5*  HCT 33.6* 31.3*    Assessment/Plan: Status post Cesarean section. Doing well postoperatively.  Continue current care. Patient desires to stay one more day. Baby girl in NICU.  Expect D/C  Tomorrow 8/30  Jill Sandoval, Jill Sandoval 09/26/2016, 9:14 AM

## 2016-09-26 NOTE — Progress Notes (Signed)
CSW met with parents at baby's NICU bedside to introduce services and request time to meet with them prior to MOB's discharge if possible.  CSW notes hx of Anxiety noted in MOB's PNR.  MOB was cheerful and states baby is doing well under the circumstances.  MOB states she will be discharged tomorrow and welcomes CSW's visit this afternoon or tomorrow morning.  CSW gave contact information and will attempt to follow up at a later time to allow time to visit with baby now.   

## 2016-09-26 NOTE — Lactation Note (Signed)
This note was copied from a baby's chart. Lactation Consultation Note  Patient Name: Jill Sandoval RUEAV'WToday's Date: 09/26/2016 Reason for consult: Follow-up assessment;NICU baby;Late-preterm 34-36.6wks   Follow up with mom of LPT NICU infant. Mom reports her breasts are feeling fuller today. Mom reports she is pumping every 2-3 hours and colostrum volumes are increasing. Mom without questions/concerns or needs from Lactation at this time. Enc mom to call prn.    Maternal Data Formula Feeding for Exclusion: No Has patient been taught Hand Expression?: Yes Does the patient have breastfeeding experience prior to this delivery?: Yes  Feeding    LATCH Score                   Interventions    Lactation Tools Discussed/Used WIC Program: Yes Pump Review: Setup, frequency, and cleaning Initiated by:: Reviewed and encouraged   Consult Status Consult Status: Follow-up Date: 09/27/16 Follow-up type: In-patient    Silas FloodSharon S Henleigh Robello 09/26/2016, 8:55 AM

## 2016-09-27 MED ORDER — MEASLES, MUMPS & RUBELLA VAC ~~LOC~~ INJ
0.5000 mL | INJECTION | Freq: Once | SUBCUTANEOUS | Status: DC
  Filled 2016-09-27: qty 0.5

## 2016-09-27 MED ORDER — OXYCODONE-ACETAMINOPHEN 5-325 MG PO TABS: 2.0000 | ORAL_TABLET | ORAL | 0 refills | Status: DC | PRN

## 2016-09-27 NOTE — Progress Notes (Signed)
Patient was discharged to home. Discharge instructions were read and patient verbally understood. Infant remains in NICU. Patient left with husband per ambulatory. Abdominal dressing remained clean and dry; denies heavy vaginal bleeding.

## 2016-09-27 NOTE — Discharge Summary (Signed)
Obstetric Discharge Summary Reason for Admission: onset of labor Prenatal Procedures: none Intrapartum Procedures: cesarean: low cervical, transverse Postpartum Procedures: Rubella Ig Complications-Operative and Postpartum: none Hemoglobin  Date Value Ref Range Status  09/25/2016 10.5 (L) 12.0 - 15.0 g/dL Final  16/10/960406/01/2015 54.012.6 11.1 - 15.9 g/dL Final   HCT  Date Value Ref Range Status  09/25/2016 31.3 (L) 36.0 - 46.0 % Final   Hematocrit  Date Value Ref Range Status  06/30/2015 38.1 34.0 - 46.6 % Final    Discharge Diagnoses: Term Pregnancy-delivered  Discharge Information: Date: 09/27/2016 Activity: pelvic rest Diet: routine Medications: Percocet Condition: stable Instructions: refer to practice specific booklet Discharge to: home Follow-up Information    Waynard Reedsoss, [name], MD Follow up in 4 week(s).   Specialty:  Obstetrics and Gynecology Contact information: [address] [place] [id number] [phone]           Newborn Data: Live born female  Birth Weight: 7 lb 3.7 oz (3280 g) APGAR: 6, 8  Baby in NICU for prematurity.  Jill Sandoval A 09/27/2016, 7:18 AM

## 2016-09-27 NOTE — Progress Notes (Signed)
CSW attempted to meet with parents to offer support and complete assessment due to baby's admission to NICU, and follow up from brief initial meeting yesterday, but MOB had already been discharged.  CSW will attempt to follow up when they visit baby in NICU if possible.

## 2016-09-27 NOTE — Progress Notes (Signed)
  Patient is eating, ambulating, voiding.  Pain control is good.  Vitals:   09/26/16 0821 09/26/16 1155 09/26/16 1609 09/26/16 2139  BP: 123/66 100/69 112/66 (!) 110/52  Pulse: 64 80 96 83  Resp: _0 Temp: 97.8 F (36.6 C) 98.3 F (36.8 C) 97.7 F (36.5 C) 98 F (36.7 C)  TempSrc: Oral Oral Oral Oral  SpO2: 100% 98% 97% 99%  Weight:      Height:        lungs:   clear to auscultation cor:    RRR Abdomen:  soft, appropriate tenderness, incisions intact and without erythema or exudate ex:    no cords   Lab Results  Component Value Date   WBC 14.0 (H) 09/25/2016   HGB 10.5 (L) 09/25/2016   HCT 31.3 (L) 09/25/2016   MCV 83.2 09/25/2016   PLT 264 09/25/2016    --/--/O POS (08/27 1808)/RNI  A/P    Post operative day 3.  Routine post op and postpartum care.  Expect d/c routine.  Percocet for pain control. MMR.

## 2016-10-03 ENCOUNTER — Ambulatory Visit: Payer: Self-pay

## 2016-10-03 NOTE — Lactation Note (Signed)
This note was copied from a baby's chart. Lactation Consultation Note  Patient Name: Jill Sandoval RUEAV'WToday's Date: 10/03/2016 Reason for consult: Follow-up assessment;NICU baby;Difficult latch  NICU baby 279 days old. Mom attempted to latch baby to left breast in cradle position. Mom's nipples are flat and mom reports that she had to use NS with first child. Mom states that she is able to consistently pump 4 ounces of EBM from left breast, but gets 3 ounces--sometimes more--from right breast. Mom's breasts are firm and heavy, and mom reports that she has not pumped because she was waiting for the baby to wake and nurse. Mom also states that her breasts drip/leak throughout the day.  Assisted mom to latch baby to left breast in cradle position, and baby actively trying to nurse, but mom's breasts are so full and tight that they are not compressible. Enc mom to pump closer to the latch so that breasts are compressible, and discussed how a strong letdown could be too much for the baby at this time. Attempted to latch on right breast, but milk not easily expressible and breast not easily compressible. Fitted mom with a #24 NS on left breast and mom able to apply. Baby able to latch to left breast and nurse well with good deep jaw excursion and intermittent swallowing noted.   Discussed with mom the need to continue pumping--even if baby nursing exclusively at home--for as long as she is using the NS in order to protect supply. Enc mom to make sure breasts stay soft and that she pumps routinely to prevent a reduction in supply. Enc mom to call for assistance as needed.   Maternal Data    Feeding Feeding Type: Breast Fed  LATCH Score Latch: Too sleepy or reluctant, no latch achieved, no sucking elicited.  Audible Swallowing: None  Type of Nipple: Flat  Comfort (Breast/Nipple): Soft / non-tender  Hold (Positioning): Assistance needed to correctly position infant at breast and maintain  latch.  LATCH Score: 4  Interventions Interventions: Assisted with latch;Skin to skin;Position options;Adjust position;Breast compression;Pre-pump if needed;Hand express  Lactation Tools Discussed/Used Tools: Nipple Shields Nipple shield size: 24   Consult Status Consult Status: PRN    Sherlyn HayJennifer D Roxanne Panek 10/03/2016, 2:07 PM

## 2016-10-16 ENCOUNTER — Inpatient Hospital Stay (HOSPITAL_COMMUNITY): Admit: 2016-10-16 | Payer: Medicaid Other | Admitting: Obstetrics and Gynecology

## 2017-02-25 DIAGNOSIS — H5213 Myopia, bilateral: Secondary | ICD-10-CM | POA: Diagnosis not present

## 2017-03-21 MED FILL — NEXPLANON 68 MG IMPLANT: 68 | 90 days supply | Qty: 1 | Fill #0

## 2017-03-22 DIAGNOSIS — Z3049 Encounter for surveillance of other contraceptives: Secondary | ICD-10-CM | POA: Diagnosis not present

## 2017-03-22 DIAGNOSIS — Z3202 Encounter for pregnancy test, result negative: Secondary | ICD-10-CM | POA: Diagnosis not present

## 2017-04-01 DIAGNOSIS — J069 Acute upper respiratory infection, unspecified: Secondary | ICD-10-CM | POA: Diagnosis not present

## 2017-04-01 DIAGNOSIS — J029 Acute pharyngitis, unspecified: Secondary | ICD-10-CM | POA: Diagnosis not present

## 2017-04-05 ENCOUNTER — Telehealth: Payer: 59 | Admitting: Family

## 2017-04-05 DIAGNOSIS — J028 Acute pharyngitis due to other specified organisms: Secondary | ICD-10-CM | POA: Diagnosis not present

## 2017-04-05 DIAGNOSIS — B9689 Other specified bacterial agents as the cause of diseases classified elsewhere: Secondary | ICD-10-CM

## 2017-04-05 MED ORDER — PREDNISONE 5 MG PO TABS: 5.0000 mg | ORAL_TABLET | ORAL | 0 refills | Status: DC

## 2017-04-05 MED ORDER — BENZONATATE 100 MG PO CAPS: 100.0000 mg | ORAL_CAPSULE | Freq: Three times a day (TID) | ORAL | 0 refills | Status: DC | PRN

## 2017-04-05 NOTE — Progress Notes (Signed)
Thank you for the details you included in the comment boxes. Those details are very helpful in determining the best course of treatment for you and help [place] to provide the best care. This appears as though it has shifted somewhat from a sinus infection toward an upper airway/lung infection. That being said, continue the antibiotics as they will work even 3-5 days after the last dose. At this point, we need to treat your symptoms. Given the cough and trouble breathing, a low-dose of steroids will help along with treatment for the coughing itself. We would not consider more antibiotics until at least 5 days after the last dose of the one you are on. I am confident that they will continue to help you.   We are sorry that you are not feeling well.  Here is how we plan to help!  Based on your presentation I believe you most likely have A cough due to bacteria.  When patients have a fever and a productive cough with a change in color or increased sputum production, we are concerned about bacterial bronchitis.  If left untreated it can progress to pneumonia.  If your symptoms do not improve with your treatment plan it is important that you contact your provider.    In addition you may use A non-prescription cough medication called Mucinex DM: take 2 tablets every 12 hours. and A prescription cough medication called Tessalon Perles 100mg . You may take 1-2 capsules every 8 hours as needed for your cough.  Sterapred 5 mg dosepak  From your responses in the eVisit questionnaire you describe inflammation in the upper respiratory tract which is causing a significant cough.  This is commonly called Bronchitis and has four common causes:    Allergies  Viral Infections  Acid Reflux  Bacterial Infection Allergies, viruses and acid reflux are treated by controlling symptoms or eliminating the cause. An example might be a cough caused by taking certain blood pressure medications. You stop the cough by changing the  medication. Another example might be a cough caused by acid reflux. Controlling the reflux helps control the cough.  USE OF BRONCHODILATOR ("RESCUE") INHALERS: There is a risk from using your bronchodilator too frequently.  The risk is that over-reliance on a medication which only relaxes the muscles surrounding the breathing tubes can reduce the effectiveness of medications prescribed to reduce swelling and congestion of the tubes themselves.  Although you feel brief relief from the bronchodilator inhaler, your asthma may actually be worsening with the tubes becoming more swollen and filled with mucus.  This can delay other crucial treatments, such as oral steroid medications. If you need to use a bronchodilator inhaler daily, several times per day, you should discuss this with your provider.  There are probably better treatments that could be used to keep your asthma under control.     HOME CARE . Only take medications as instructed by your medical team. . Complete the entire course of an antibiotic. . Drink plenty of fluids and get plenty of rest. . Avoid close contacts especially the very young and the elderly . Cover your mouth if you cough or cough into your sleeve. . Always remember to wash your hands . A steam or ultrasonic humidifier can help congestion.   GET HELP RIGHT AWAY IF: . You develop worsening fever. . You become [place] of breath . You cough up blood. . Your symptoms persist after you have completed your treatment plan MAKE SURE YOU   Understand these instructions.  Will watch your condition.  Will get help right away if you are not doing well or get worse.  Your e-visit answers were reviewed by a board certified advanced clinical practitioner to complete your personal care plan.  Depending on the condition, your plan could have included both over the counter or prescription medications. If there is a problem please reply  once you have received a response from your  provider. Your safety is important to us.  If you have drug allergies check your prescription carefully.    You can use MyChart to ask questions about today's visit, request a non-urgent call back, or ask for a work or school excuse for 24 hours related to this e-Visit. If it has been greater than 24 hours you will need to follow up with your provider, or enter a new e-Visit to address those concerns. You will get an e-mail in the next two days asking about your experience.  I hope that your e-visit has been valuable and will speed your recovery. Thank you for using e-visits.

## 2017-05-21 ENCOUNTER — Telehealth: Payer: 59 | Admitting: Family

## 2017-05-21 DIAGNOSIS — J019 Acute sinusitis, unspecified: Secondary | ICD-10-CM

## 2017-05-21 DIAGNOSIS — B9789 Other viral agents as the cause of diseases classified elsewhere: Secondary | ICD-10-CM | POA: Diagnosis not present

## 2017-05-21 MED ORDER — FLUTICASONE PROPIONATE 50 MCG/ACT NA SUSP: 2.0000 | Freq: Every day | NASAL | 0 refills | Status: DC

## 2017-05-21 NOTE — Progress Notes (Signed)

## 2017-07-09 DIAGNOSIS — F331 Major depressive disorder, recurrent, moderate: Secondary | ICD-10-CM | POA: Diagnosis not present

## 2017-07-09 DIAGNOSIS — Z0000 Encounter for general adult medical examination without abnormal findings: Secondary | ICD-10-CM | POA: Diagnosis not present

## 2017-07-09 DIAGNOSIS — Z13220 Encounter for screening for lipoid disorders: Secondary | ICD-10-CM | POA: Diagnosis not present

## 2017-07-09 DIAGNOSIS — Z6839 Body mass index (BMI) 39.0-39.9, adult: Secondary | ICD-10-CM | POA: Diagnosis not present

## 2017-07-09 DIAGNOSIS — E6609 Other obesity due to excess calories: Secondary | ICD-10-CM | POA: Diagnosis not present

## 2017-07-09 DIAGNOSIS — R4586 Emotional lability: Secondary | ICD-10-CM | POA: Diagnosis not present

## 2017-07-09 DIAGNOSIS — R635 Abnormal weight gain: Secondary | ICD-10-CM | POA: Diagnosis not present

## 2017-08-20 DIAGNOSIS — F53 Postpartum depression: Secondary | ICD-10-CM | POA: Diagnosis not present

## 2017-08-20 DIAGNOSIS — F411 Generalized anxiety disorder: Secondary | ICD-10-CM | POA: Diagnosis not present

## 2017-08-20 DIAGNOSIS — F603 Borderline personality disorder: Secondary | ICD-10-CM | POA: Diagnosis not present

## 2017-08-20 DIAGNOSIS — F422 Mixed obsessional thoughts and acts: Secondary | ICD-10-CM | POA: Diagnosis not present

## 2017-09-10 DIAGNOSIS — F410 Panic disorder [episodic paroxysmal anxiety] without agoraphobia: Secondary | ICD-10-CM | POA: Diagnosis not present

## 2017-09-10 DIAGNOSIS — F53 Postpartum depression: Secondary | ICD-10-CM | POA: Diagnosis not present

## 2017-09-10 DIAGNOSIS — F422 Mixed obsessional thoughts and acts: Secondary | ICD-10-CM | POA: Diagnosis not present

## 2017-09-10 DIAGNOSIS — E669 Obesity, unspecified: Secondary | ICD-10-CM | POA: Diagnosis not present

## 2017-09-10 DIAGNOSIS — F603 Borderline personality disorder: Secondary | ICD-10-CM | POA: Diagnosis not present

## 2017-09-10 DIAGNOSIS — Z713 Dietary counseling and surveillance: Secondary | ICD-10-CM | POA: Diagnosis not present

## 2017-09-10 DIAGNOSIS — F331 Major depressive disorder, recurrent, moderate: Secondary | ICD-10-CM | POA: Diagnosis not present

## 2017-09-17 DIAGNOSIS — F603 Borderline personality disorder: Secondary | ICD-10-CM | POA: Diagnosis not present

## 2017-09-17 DIAGNOSIS — F53 Postpartum depression: Secondary | ICD-10-CM | POA: Diagnosis not present

## 2017-09-17 DIAGNOSIS — F422 Mixed obsessional thoughts and acts: Secondary | ICD-10-CM | POA: Diagnosis not present

## 2017-09-17 DIAGNOSIS — F410 Panic disorder [episodic paroxysmal anxiety] without agoraphobia: Secondary | ICD-10-CM | POA: Diagnosis not present

## 2017-09-25 ENCOUNTER — Other Ambulatory Visit: Payer: Self-pay | Admitting: Family

## 2017-10-22 MED FILL — OXcarbazepine 300 MG TABS: 300 | 30 days supply | Qty: 90 | Fill #0

## 2017-10-23 DIAGNOSIS — F410 Panic disorder [episodic paroxysmal anxiety] without agoraphobia: Secondary | ICD-10-CM | POA: Diagnosis not present

## 2017-10-23 DIAGNOSIS — F53 Postpartum depression: Secondary | ICD-10-CM | POA: Diagnosis not present

## 2017-10-23 DIAGNOSIS — F603 Borderline personality disorder: Secondary | ICD-10-CM | POA: Diagnosis not present

## 2017-10-23 DIAGNOSIS — F422 Mixed obsessional thoughts and acts: Secondary | ICD-10-CM | POA: Diagnosis not present

## 2017-11-04 MED FILL — OXcarbazepine 300 MG TABS: 300 | 30 days supply | Qty: 90 | Fill #0

## 2017-11-06 ENCOUNTER — Telehealth: Payer: 59 | Admitting: Family

## 2017-11-06 DIAGNOSIS — J329 Chronic sinusitis, unspecified: Secondary | ICD-10-CM

## 2017-11-06 DIAGNOSIS — B9789 Other viral agents as the cause of diseases classified elsewhere: Secondary | ICD-10-CM | POA: Diagnosis not present

## 2017-11-06 MED ORDER — FLUTICASONE PROPIONATE 50 MCG/ACT NA SUSP: 2.0000 | Freq: Every day | NASAL | 6 refills | Status: DC

## 2017-11-06 NOTE — Progress Notes (Signed)

## 2017-11-28 ENCOUNTER — Other Ambulatory Visit: Payer: Self-pay

## 2017-11-28 MED ORDER — OXCARBAZEPINE 300 MG PO TABS: ORAL_TABLET | ORAL | 0 refills | Status: DC

## 2017-11-28 MED FILL — OXcarbazepine 300 MG TABS: 300 | 30 days supply | Qty: 120 | Fill #0

## 2017-12-09 ENCOUNTER — Other Ambulatory Visit: Payer: Self-pay | Admitting: Internal Medicine

## 2017-12-09 DIAGNOSIS — R109 Unspecified abdominal pain: Secondary | ICD-10-CM

## 2017-12-09 DIAGNOSIS — R197 Diarrhea, unspecified: Secondary | ICD-10-CM | POA: Diagnosis not present

## 2017-12-10 ENCOUNTER — Other Ambulatory Visit: Payer: Self-pay | Admitting: Internal Medicine

## 2017-12-10 DIAGNOSIS — R1032 Left lower quadrant pain: Secondary | ICD-10-CM

## 2017-12-11 ENCOUNTER — Ambulatory Visit
Admission: RE | Admit: 2017-12-11 | Discharge: 2017-12-11 | Disposition: A | Discharge status: Home / Self Care | Payer: 59 | Source: Ambulatory Visit | Attending: Internal Medicine | Admitting: Internal Medicine

## 2017-12-11 DIAGNOSIS — K439 Ventral hernia without obstruction or gangrene: Secondary | ICD-10-CM | POA: Diagnosis not present

## 2017-12-11 DIAGNOSIS — R1032 Left lower quadrant pain: Secondary | ICD-10-CM

## 2017-12-11 MED ORDER — IOPAMIDOL (ISOVUE-300) INJECTION 61%
100.0000 mL | Freq: Once | INTRAVENOUS | Status: AC | PRN
  Administered 2017-12-11: 100 mL via INTRAVENOUS

## 2017-12-13 ENCOUNTER — Other Ambulatory Visit: Payer: 59

## 2017-12-18 ENCOUNTER — Ambulatory Visit: Payer: Self-pay | Admitting: Psychiatry

## 2017-12-31 DIAGNOSIS — N949 Unspecified condition associated with female genital organs and menstrual cycle: Secondary | ICD-10-CM | POA: Diagnosis not present

## 2017-12-31 DIAGNOSIS — K529 Noninfective gastroenteritis and colitis, unspecified: Secondary | ICD-10-CM | POA: Diagnosis not present

## 2018-01-01 DIAGNOSIS — R197 Diarrhea, unspecified: Secondary | ICD-10-CM | POA: Diagnosis not present

## 2018-01-01 DIAGNOSIS — R109 Unspecified abdominal pain: Secondary | ICD-10-CM | POA: Diagnosis not present

## 2018-01-02 ENCOUNTER — Other Ambulatory Visit: Payer: Self-pay

## 2018-01-02 DIAGNOSIS — R197 Diarrhea, unspecified: Secondary | ICD-10-CM | POA: Diagnosis not present

## 2018-01-02 MED ORDER — OXCARBAZEPINE 300 MG PO TABS: ORAL_TABLET | ORAL | 0 refills | Status: DC

## 2018-01-02 MED FILL — OXcarbazepine 300 MG TABS: 300 | 30 days supply | Qty: 120 | Fill #0

## 2018-01-03 DIAGNOSIS — N83209 Unspecified ovarian cyst, unspecified side: Secondary | ICD-10-CM | POA: Diagnosis not present

## 2018-02-03 ENCOUNTER — Other Ambulatory Visit: Payer: Self-pay | Admitting: Psychiatry

## 2018-02-06 ENCOUNTER — Ambulatory Visit (INDEPENDENT_AMBULATORY_CARE_PROVIDER_SITE_OTHER): Payer: 59 | Admitting: Psychiatry

## 2018-02-06 DIAGNOSIS — F39 Unspecified mood [affective] disorder: Secondary | ICD-10-CM

## 2018-02-06 DIAGNOSIS — F603 Borderline personality disorder: Secondary | ICD-10-CM | POA: Diagnosis not present

## 2018-02-06 MED ORDER — OXCARBAZEPINE 300 MG PO TABS: ORAL_TABLET | ORAL | 0 refills | Status: DC

## 2018-02-06 NOTE — Progress Notes (Signed)
[facility]  Patient ID: Jill Sandoval,  MRN: [id number]  PCP: Lorenda Ishihara, MD  Date of Evaluation: 02/06/2018 Time spent:20"  Chief Complaint:   HISTORY/CURRENT STATUS: HPI pt seen 10/23/17. Marland Kitchenincreased aggressiveness past 6 weeks. Also depression increased 1 month ago. Denies suicidal thoughts.1 month ago had 1 week where she felt really good, goal oriented and increased talking.  Individual Medical History/ Review of Systems: Changes?no :  Allergies: Patient has no known allergies.  Current Medications:  Current Outpatient Medications:  .  Oxcarbazepine (TRILEPTAL) 300 MG tablet, TAKE 2 TABLETS BY MOUTH TWICE A DAY **NEED OFFICE VISIT**, Disp: 120 tablet, Rfl: 0 .  benzonatate (TESSALON PERLES) 100 MG capsule, Take 1-2 capsules (100-200 mg total) by mouth every 8 (eight) hours as needed for cough., Disp: 30 capsule, Rfl: 0 .  fluticasone (FLONASE) 50 MCG/ACT nasal spray, Place 2 sprays into both nostrils daily., Disp: 16 g, Rfl: 0 .  fluticasone (FLONASE) 50 MCG/ACT nasal spray, Place 2 sprays into both nostrils daily., Disp: 16 g, Rfl: 6 .  oxyCODONE-acetaminophen (PERCOCET/ROXICET) 5-325 MG tablet, Take 2 tablets by mouth every 4 (four) hours as needed (pain scale > 7)., Disp: 30 tablet, Rfl: 0 .  predniSONE (DELTASONE) 5 MG tablet, Take 1 tablet (5 mg total) by mouth as directed. Taper each day 6,5,4,3,2,1, Disp: 21 tablet, Rfl: 0 .  Prenatal MV-Min-FA-Omega-3 (PRENATAL GUMMIES/DHA & FA) 0.4-32.5 MG CHEW, Chew 2 each by mouth daily., Disp: , Rfl:  .  ranitidine (ZANTAC) 150 MG tablet, Take 150 mg by mouth 2 (two) times daily as needed for heartburn., Disp: , Rfl:  Medication Side Effects: none  Family Medical/ Social History: Changes? no  MENTAL HEALTH EXAM:  unknown if currently breastfeeding.There is no height or weight on file to calculate BMI.  General Appearance: Casual  Eye Contact:  Good  Speech:  Normal Rate  Volume:  Normal  Mood:   Depressed  Affect:  Appropriate  Thought Process:  Linear  Orientation:  Full (Time, Place, and Person)  Thought Content: Logical   Suicidal Thoughts:  No  Homicidal Thoughts:  No  Memory:  WNL  Judgement:  Good  Insight:  Good  Psychomotor Activity:  Normal  Concentration:  Concentration: Good  Recall:  Good  Fund of Knowledge: Good  Language: Good  Assets:  Desire for Improvement  ADL's:  Intact  Cognition: WNL  Prognosis:  Good    DIAGNOSES:    ICD-10-CM   1. Episodic mood disorder (HCC) F39   2. Borderline personality disorder (HCC) F60.3     Receiving Psychotherapy: no   RECOMMENDATIONS: increase trileptal 300mg  from 2 bid to 3 in am and 2hs.If needed we can go as high as 900 bid per dr. Jennelle Human. rtc 2 weeks. Watch for mood swings. Consider mood diary at next visit   Anne Fu, PA-C

## 2018-02-18 ENCOUNTER — Ambulatory Visit (INDEPENDENT_AMBULATORY_CARE_PROVIDER_SITE_OTHER): Payer: 59 | Admitting: Psychiatry

## 2018-02-18 DIAGNOSIS — F39 Unspecified mood [affective] disorder: Secondary | ICD-10-CM | POA: Diagnosis not present

## 2018-02-18 DIAGNOSIS — F603 Borderline personality disorder: Secondary | ICD-10-CM

## 2018-02-18 MED ORDER — OXCARBAZEPINE 300 MG PO TABS: ORAL_TABLET | ORAL | 1 refills | Status: DC

## 2018-02-18 NOTE — Progress Notes (Signed)
[facility]  Patient ID: Jill Sandoval,  MRN: [id number]  PCP: Lorenda Ishihara, MD  Date of Evaluation: 02/18/2018 Time spent:20 minutes  Chief Complaint:   HISTORY/CURRENT STATUS: HPI seen 02/06/2018.  She was having an increase in aggression and depression.  She did report one good week where she was goal oriented and had increased talking.  We increased her Trileptal from 300 mg 2 in the morning 2 at night to 300 mg 3 in the morning and 2 at night. Currently she is doing better.  Has some depression about 1 or 2 episodes since her last visit in the last 1 day.  Occasional manic symptoms of impulsiveness and goal oriented this is a half a day 2 a day. Borderline personality disorder traits are better.  Individual Medical History/ Review of Systems: Changes? :No   Allergies: Patient has no known allergies.  Current Medications:  Current Outpatient Medications:  .  benzonatate (TESSALON PERLES) 100 MG capsule, Take 1-2 capsules (100-200 mg total) by mouth every 8 (eight) hours as needed for cough., Disp: 30 capsule, Rfl: 0 .  fluticasone (FLONASE) 50 MCG/ACT nasal spray, Place 2 sprays into both nostrils daily., Disp: 16 g, Rfl: 0 .  fluticasone (FLONASE) 50 MCG/ACT nasal spray, Place 2 sprays into both nostrils daily., Disp: 16 g, Rfl: 6 .  Oxcarbazepine (TRILEPTAL) 300 MG tablet, 3 in am and 2 hs, Disp: 150 tablet, Rfl: 0 .  oxyCODONE-acetaminophen (PERCOCET/ROXICET) 5-325 MG tablet, Take 2 tablets by mouth every 4 (four) hours as needed (pain scale > 7)., Disp: 30 tablet, Rfl: 0 .  predniSONE (DELTASONE) 5 MG tablet, Take 1 tablet (5 mg total) by mouth as directed. Taper each day 6,5,4,3,2,1, Disp: 21 tablet, Rfl: 0 .  Prenatal MV-Min-FA-Omega-3 (PRENATAL GUMMIES/DHA & FA) 0.4-32.5 MG CHEW, Chew 2 each by mouth daily., Disp: , Rfl:  .  ranitidine (ZANTAC) 150 MG tablet, Take 150 mg by mouth 2 (two) times daily as needed for heartburn., Disp: , Rfl:  Medication  Side Effects: none  Family Medical/ Social History: Changes? no  MENTAL HEALTH EXAM:  unknown if currently breastfeeding.There is no height or weight on file to calculate BMI.  General Appearance: Casual overweight  Eye Contact:  Good  Speech:  Clear and Coherent  Volume:  Normal  Mood:  Euthymic  Affect:  Appropriate  Thought Process:  Linear  Orientation:  Full (Time, Place, and Person)  Thought Content: Logical   Suicidal Thoughts:  No  Homicidal Thoughts:  No  Memory:  WNL  Judgement:  Good  Insight:  Good  Psychomotor Activity:  Normal  Concentration:  Concentration: Good  Recall:  Good  Fund of Knowledge: Good  Language: Good  Assets:  Desire for Improvement  ADL's:  Intact  Cognition: WNL  Prognosis:  Good    DIAGNOSES: No diagnosis found.  Receiving Psychotherapy: No    RECOMMENDATIONS: We will go ahead and increase her Trileptal 300 mg from 2 in the morning 3 at night to 3 pills twice daily.  With no side effects there is no need to do a blood level. Mood diary given. Return in 6 weeks.   Anne Fu, PA-C

## 2018-03-05 DIAGNOSIS — B349 Viral infection, unspecified: Secondary | ICD-10-CM | POA: Diagnosis not present

## 2018-03-05 DIAGNOSIS — J029 Acute pharyngitis, unspecified: Secondary | ICD-10-CM | POA: Diagnosis not present

## 2018-04-08 ENCOUNTER — Ambulatory Visit: Payer: 59 | Admitting: Psychiatry

## 2018-04-21 ENCOUNTER — Ambulatory Visit: Payer: 59 | Admitting: Psychiatry

## 2018-04-23 ENCOUNTER — Other Ambulatory Visit: Payer: Self-pay | Admitting: Psychiatry

## 2018-04-23 ENCOUNTER — Telehealth: Payer: Self-pay | Admitting: Psychiatry

## 2018-04-23 NOTE — Telephone Encounter (Signed)
Already submitted to [facility]

## 2018-04-23 NOTE — Telephone Encounter (Signed)
Patient called and said that clay sent a script in to [facility] [place] on January 01/2018 for oxcarazepine 300mg  3 am and 3 qhs. She never picked it up and now they are closed so can you resend this to [facility]

## 2018-04-26 MED FILL — OXcarbazepine 300 MG TABS: 300 | 30 days supply | Qty: 180 | Fill #0

## 2018-05-05 ENCOUNTER — Telehealth: Payer: 59 | Admitting: Internal Medicine

## 2018-05-05 DIAGNOSIS — J028 Acute pharyngitis due to other specified organisms: Principal | ICD-10-CM

## 2018-05-05 DIAGNOSIS — B9689 Other specified bacterial agents as the cause of diseases classified elsewhere: Secondary | ICD-10-CM

## 2018-05-05 MED ORDER — PREDNISONE 5 MG PO TABS: 5.0000 mg | ORAL_TABLET | ORAL | 0 refills | Status: DC

## 2018-05-05 MED ORDER — FLUTICASONE PROPIONATE 50 MCG/ACT NA SUSP: 2.0000 | Freq: Every day | NASAL | 0 refills | Status: DC

## 2018-05-05 MED FILL — FLUTICASONE PROP 50 MCG SPR: 50 | 30 days supply | Qty: 16 | Fill #0

## 2018-05-05 MED FILL — predniSONE 5 MG (21) TBPK: 5 | 6 days supply | Qty: 21 | Fill #0

## 2018-05-05 NOTE — Progress Notes (Unsigned)
E visit for Allergic Rhinitis We are sorry that you are not feeling well.  Here is how we plan to help!  Based on what you have shared with me it looks like you have Allergic Rhinitis.  Rhinitis is when a reaction occurs that causes nasal congestion, runny nose, sneezing, and itching.  Most types of rhinitis are caused by an inflammation and are associated with symptoms in the eyes ears or throat. There are several types of rhinitis.  The most common are acute rhinitis, which is usually caused by a viral illness, allergic or seasonal rhinitis, and nonallergic or year-round rhinitis.  Nasal allergies occur certain times of the year.  Allergic rhinitis is caused when allergens in the air trigger the release of histamine in the body.  Histamine causes itching, swelling, and fluid to build up in the fragile linings of the nasal passages, sinuses and eyelids.  An itchy nose and clear discharge are common.  I recommend the following over the counter treatments: {evisitallergyOTCtreatments:140100013}  I also would recommend a nasal spray: {evisitallergynasalspray:140100014}  You may also benefit from eye drops such as: {evisitallergyeyedrops:140100015}  HOME CARE:   You can use an over-the-counter saline nasal spray as needed  Avoid areas where there is heavy dust, mites, or molds  Stay indoors on windy days during the pollen season  Keep windows closed in home, at least in bedroom; use air conditioner.  Use high-efficiency house air filter  Keep windows closed in car, turn AC on re-circulate  Avoid playing out with dog during pollen season  GET HELP RIGHT AWAY IF:   If your symptoms do not improve within 10 days  You become [place] of breath  You develop yellow or green discharge from your nose for over 3 days  You have coughing fits  MAKE SURE YOU:   Understand these instructions  Will watch your condition  Will get help right away if you are not doing well or get  worse  Thank you for choosing an e-visit. Your e-visit answers were reviewed by a board certified advanced clinical practitioner to complete your personal care plan. Depending upon the condition, your plan could have included both over the counter or prescription medications. Please review your pharmacy choice. Be sure that the pharmacy you have chosen is open so that you can pick up your prescription now.  If there is a problem you may message your provider in MyChart to have the prescription routed to another pharmacy. Your safety is important to [place]. If you have drug allergies check your prescription carefully.  For the next 24 hours, you can use MyChart to ask questions about today's visit, request a non-urgent call back, or ask for a work or school excuse from your e-visit provider. You will get an email in the next two days asking about your experience. I hope that your e-visit has been valuable and will speed your recovery.

## 2018-05-05 NOTE — Progress Notes (Unsigned)
We are sorry that you are not feeling well.  Here is how we plan to help!  Based on what you have shared with me it looks like you have Allergic Rhinitis.  Rhinitis is when a reaction occurs that causes nasal congestion, runny nose, sneezing, and itching.  Most types of rhinitis are caused by an inflammation and are associated with symptoms in the eyes ears or throat. There are several types of rhinitis.  The most common are acute rhinitis, which is usually caused by a viral illness, allergic or seasonal rhinitis, and nonallergic or year-round rhinitis.  Nasal allergies occur certain times of the year.  Allergic rhinitis is caused when allergens in the air trigger the release of histamine in the body.  Histamine causes itching, swelling, and fluid to build up in the fragile linings of the nasal passages, sinuses and eyelids.  An itchy nose and clear discharge are common.  I recommend the following over the counter treatments: When you come back in the house, shower off so if there is any allergens stuck to your hair or clothings your symptoms wont get worse while indoors. Also try Netie Pot saline rinses twice a day am and dinner time( not at bed time, and not with tap water) x 5-7 days to clean up allergens from your nose  I also would recommend a nasal spray: You may continue your Zyrtec, and I sent Flonase nose spray You may also benefit from eye drops such as: Visine allergy if your eyes bother you with itching from the allergies.  I also sent a Ziff course of predisone.   HOME CARE:   You can use an over-the-counter saline nasal spray as needed  Avoid areas where there is heavy dust, mites, or molds  Stay indoors on windy days during the pollen season  Keep windows closed in home, at least in bedroom; use air conditioner.  Use high-efficiency house air filter  Keep windows closed in car, turn AC on re-circulate  Avoid playing out with dog during pollen season  GET HELP  RIGHT AWAY IF:   If your symptoms do not improve within 10 days  You become [place] of breath  You develop yellow or green discharge from your nose for over 3 days  You have coughing fits  MAKE SURE YOU:   Understand these instructions  Will watch your condition  Will get help right away if you are not doing well or get worse  Thank you for choosing an e-visit. Your e-visit answers were reviewed by a board certified advanced clinical practitioner to complete your personal care plan. Depending upon the condition, your plan could have included both over the counter or prescription medications. Please review your pharmacy choice. Be sure that the pharmacy you have chosen is open so that you can pick up your prescription now.  If there is a problem you may message your provider in MyChart to have the prescription routed to another pharmacy. Your safety is important to [place]. If you have drug allergies check your prescription carefully.  For the next 24 hours, you can use MyChart to ask questions about today's visit, request a non-urgent call back, or ask for a work or school excuse from your e-visit provider. You will get an email in the next two days asking about your experience. I hope that your e-visit has been valuable and will speed your recovery.

## 2018-06-16 MED FILL — OXcarbazepine 300 MG TABS: 300 | 30 days supply | Qty: 180 | Fill #1

## 2018-06-25 ENCOUNTER — Ambulatory Visit (INDEPENDENT_AMBULATORY_CARE_PROVIDER_SITE_OTHER): Payer: 59 | Admitting: Primary Care

## 2018-06-25 ENCOUNTER — Other Ambulatory Visit: Payer: Self-pay

## 2018-06-25 ENCOUNTER — Encounter: Payer: Self-pay | Admitting: Primary Care

## 2018-06-25 VITALS — BP 110/72 | HR 73 | Temp 98.6°F | Ht 64.0 in | Wt 229.6 lb

## 2018-06-25 DIAGNOSIS — K1379 Other lesions of oral mucosa: Secondary | ICD-10-CM

## 2018-06-25 DIAGNOSIS — M25473 Effusion, unspecified ankle: Secondary | ICD-10-CM

## 2018-06-25 DIAGNOSIS — F32A Depression, unspecified: Secondary | ICD-10-CM

## 2018-06-25 DIAGNOSIS — F419 Anxiety disorder, unspecified: Secondary | ICD-10-CM | POA: Diagnosis not present

## 2018-06-25 DIAGNOSIS — F329 Major depressive disorder, single episode, unspecified: Secondary | ICD-10-CM | POA: Diagnosis not present

## 2018-06-25 NOTE — Assessment & Plan Note (Signed)
Following with psychiatry, managed on Trileptal.  Will be establishing with new psychiatrist soon.

## 2018-06-25 NOTE — Progress Notes (Signed)
Subjective:    Patient ID: Jill Sandoval, female    DOB: Oct 30, 1990, 28 y.o.   MRN: [id number]  HPI  Jill Sandoval is a 28 year old female who presents today to establish care and discuss the problems mentioned below. Will obtain/review records.  1) Anxiety and Depression: Currently managed on oxcarbazepine 900 mg BID and is following with [facility]. She's been following with psychiatry for 6-12 months and does feel better on Trileptal in terms of mood swings but doesn't feel as though it helps with anxiety.   2) Ankle Edema: Bilaterally, worse to left side. Present for the last several days after returning from a beach vacation. She did experience a moderate to severe sunburn to her bilateral feet while she was at the beach last weekend. The car ride to and from the beach was 3.5 hours each way. The swelling has improved since. She denies calf pain, erythema. She does have implanted birth control device. She smokes on occasion.   3) Oral Pain: Located to the right lower molar region which she first noticed yesterday. She denies fevers, dental drainage, dental bleeding. She does see a dentist and has an appointment scheduled on June 8th. She does have pain to the right lower molar region when swallowing liquids or solids. She has noticed that warm liquids improve discomfort. She doesn't have pain without swallowing. She wears braces.   Review of Systems  Constitutional: Negative for fever.  HENT: Negative for sinus pressure and sore throat.        Right lower molar/jaw pain  Respiratory: Negative for cough.   Psychiatric/Behavioral:       See HPI       Past Medical History:  Diagnosis Date  . Anxiety and depression   . Asthma    inhaler prn  . Miscarriage      Social History   Socioeconomic History  . Marital status: Married    Spouse name: Not on file  . Number of children: Not on file  . Years of education: Not on file  . Highest education level: Not on file   Occupational History  . Not on file  Social Needs  . Financial resource strain: Not on file  . Food insecurity:    Worry: Not on file    Inability: Not on file  . Transportation needs:    Medical: Not on file    Non-medical: Not on file  Tobacco Use  . Smoking status: Former Smoker    Packs/day: 0.25    Types: Cigarettes    Last attempt to quit: 05/30/2015    Years since quitting: 3.0  . Smokeless tobacco: Never Used  Substance and Sexual Activity  . Alcohol use: No  . Drug use: No  . Sexual activity: Yes  Lifestyle  . Physical activity:    Days per week: Not on file    Minutes per session: Not on file  . Stress: Not on file  Relationships  . Social connections:    Talks on phone: Not on file    Gets together: Not on file    Attends religious service: Not on file    Active member of club or organization: Not on file    Attends meetings of clubs or organizations: Not on file    Relationship status: Not on file  . Intimate partner violence:    Fear of current or ex partner: Not on file    Emotionally abused: Not on file  Physically abused: Not on file    Forced sexual activity: Not on file  Other Topics Concern  . Not on file  Social History Narrative   Married.   2 children.   Works at [employer].   Enjoys camping, being outdoors.     Past Surgical History:  Procedure Laterality Date  . CESAREAN SECTION N/A 03/24/2012   Procedure: CESAREAN SECTION;  Surgeon: Mickel Baasichard D Kaplan, MD;  Location: [facility];  Service: Obstetrics;  Laterality: N/A;  . CESAREAN SECTION N/A 09/24/2016   Procedure: CESAREAN SECTION;  Surgeon: Waynard Reedsoss, [name], MD;  [facility];  Service: Obstetrics;  Laterality: N/A;    Family History  Problem Relation Age of Onset  . Hypertension Maternal Grandmother   . Prostate cancer Maternal Grandfather   . Hearing loss Brother   . Hearing loss Paternal Grandfather   . Hypertension Father   . Other Neg Hx     No Known Allergies   Current Outpatient Medications on File Prior to Visit  Medication Sig Dispense Refill  . Oxcarbazepine (TRILEPTAL) 300 MG tablet TAKE 3 TABLETS BY MOUTH EVERY MORNING AND 3 TABLETS BY MOUTH AT BEDTIME 180 tablet 1   No current facility-administered medications on file prior to visit.     BP 110/72   Pulse 73   Temp 98.6 F (37 C) (Tympanic)   Ht 5\' 4"  (1.626 m)   Wt 229 lb 9 oz (104.1 kg)   SpO2 99%   BMI 39.40 kg/m    Objective:   Physical Exam  Constitutional: She appears well-nourished. She does not have a sickly appearance. She does not appear ill.  HENT:  Right Ear: Tympanic membrane and ear canal normal.  Left Ear: Tympanic membrane and ear canal normal.  Mouth/Throat: Oropharynx is clear and moist. No oropharyngeal exudate.  No dental drainage, no gum erythema or abscess.   Neck: Neck supple.  Cardiovascular: Normal rate and regular rhythm.  Mild edema noted to left ankle. No pitting.  Respiratory: Effort normal and breath sounds normal.  Skin: Skin is warm and dry.  Sunburn present to bilateral feet and lower extremities. Skin intact.  Psychiatric: She has a normal mood and affect.           Assessment & Plan:  Ankle Edema;  Acute for the last several days, improved. Suspect this could be secondary to a mixture of sunburn and long car rides to and from the beach. No alarm signs. Continue to elevate. She will update if swelling doesn't improve.   Doreene NestKatherine K Johanne Mcglade, NP

## 2018-06-25 NOTE — Assessment & Plan Note (Signed)
Acute to right lower molar region. Exam today without obvious cause and appears benign.  She does not appear sickly. Will have her follow up with her dentist as scheduled in early June. She will update me sooner if symptoms progress. Will have her start Ibuprofen for inflammation and pain.

## 2018-06-25 NOTE — Patient Instructions (Signed)
Follow up with your dentist as scheduled. Please update me if your oral pain persists.  You can take Ibuprofen 600 mg every 8 hours as needed.  Elevate your legs when resting.   Follow up with your psychiatrist as discussed.   It was a pleasure to meet you today! Please don't hesitate to call or message me with any questions. Welcome to [employer]!

## 2018-07-04 ENCOUNTER — Ambulatory Visit: Payer: 59 | Admitting: Primary Care

## 2018-07-08 ENCOUNTER — Other Ambulatory Visit: Payer: Self-pay

## 2018-07-08 ENCOUNTER — Ambulatory Visit (INDEPENDENT_AMBULATORY_CARE_PROVIDER_SITE_OTHER): Payer: 59 | Admitting: Primary Care

## 2018-07-08 ENCOUNTER — Encounter: Payer: Self-pay | Admitting: Primary Care

## 2018-07-08 ENCOUNTER — Ambulatory Visit: Payer: 59 | Admitting: Primary Care

## 2018-07-08 VITALS — BP 116/70 | HR 75 | Temp 98.2°F | Ht 64.0 in | Wt 229.0 lb

## 2018-07-08 DIAGNOSIS — F419 Anxiety disorder, unspecified: Secondary | ICD-10-CM

## 2018-07-08 DIAGNOSIS — F329 Major depressive disorder, single episode, unspecified: Secondary | ICD-10-CM

## 2018-07-08 DIAGNOSIS — F32A Depression, unspecified: Secondary | ICD-10-CM

## 2018-07-08 MED ORDER — CITALOPRAM HYDROBROMIDE 20 MG PO TABS: 20.0000 mg | ORAL_TABLET | Freq: Every day | ORAL | 1 refills | Status: DC

## 2018-07-08 NOTE — Progress Notes (Signed)
Subjective:    Patient ID: Jill Sandoval, female    DOB: Aug 16, 1990, 28 y.o.   MRN: [id number]  HPI  Jill Sandoval is a 28 year old female with a history of mood disorder, anxiety/depression who presents today with a chief complaint of anxiety and depression.  Chronic history of anxiety and depression for years since the loss of her second child three years ago. She is currently following with psychiatry and has been managed on Trileptal for the last one year. Overall she has felt well on the Trileptal for mood swings (anger outbursts) but recently hasn't felt that it has helped with depression and anxiety specifically.   Symptoms include not wanting to get out of bed, tearfulness, feeling alone, doesn't feel like herself, wants to sleep, fatigue, difficulty sleeping. She will get up several times during the night as she cannot stay asleep, this has been going on for several months. She will take naps during the day. Her last appointment with her psychiatrist ([facility]) was about four months ago before he passed away, she is waiting to get connected with another psychiatrist but hasn't heard back. PHQ 9 score of 15 and GAD 7 score of 8 today. Denies SI/HI.  She was once managed on Zoloft but it didn't do anything for her symptoms.   Review of Systems  Respiratory: Negative for shortness of breath.   Cardiovascular: Negative for chest pain.  Psychiatric/Behavioral: Positive for sleep disturbance. Negative for suicidal ideas. The patient is nervous/anxious.        See HPI       Past Medical History:  Diagnosis Date  . Anxiety and depression   . Asthma    inhaler prn  . Fetal demise > 22 weeks, delivered, current hospitalization 11/04/2015  . Miscarriage      Social History   Socioeconomic History  . Marital status: Married    Spouse name: Not on file  . Number of children: Not on file  . Years of education: Not on file  . Highest education level: Not on file  Occupational  History  . Not on file  Social Needs  . Financial resource strain: Not on file  . Food insecurity:    Worry: Not on file    Inability: Not on file  . Transportation needs:    Medical: Not on file    Non-medical: Not on file  Tobacco Use  . Smoking status: Former Smoker    Packs/day: 0.25    Types: Cigarettes    Last attempt to quit: 05/30/2015    Years since quitting: 3.1  . Smokeless tobacco: Never Used  Substance and Sexual Activity  . Alcohol use: No  . Drug use: No  . Sexual activity: Yes  Lifestyle  . Physical activity:    Days per week: Not on file    Minutes per session: Not on file  . Stress: Not on file  Relationships  . Social connections:    Talks on phone: Not on file    Gets together: Not on file    Attends religious service: Not on file    Active member of club or organization: Not on file    Attends meetings of clubs or organizations: Not on file    Relationship status: Not on file  . Intimate partner violence:    Fear of current or ex partner: Not on file    Emotionally abused: Not on file    Physically abused: Not on file  Forced sexual activity: Not on file  Other Topics Concern  . Not on file  Social History Narrative   Married.   2 children.   Works at [employer].   Enjoys camping, being outdoors.     Past Surgical History:  Procedure Laterality Date  . CESAREAN SECTION N/A 03/24/2012   Procedure: CESAREAN SECTION;  Surgeon: Mickel Baasichard D Kaplan, MD;  Location: [facility];  Service: Obstetrics;  Laterality: N/A;  . CESAREAN SECTION N/A 09/24/2016   Procedure: CESAREAN SECTION;  Surgeon: Waynard Reedsoss, [name], MD;  [facility];  Service: Obstetrics;  Laterality: N/A;    Family History  Problem Relation Age of Onset  . Hypertension Maternal Grandmother   . Prostate cancer Maternal Grandfather   . Hearing loss Brother   . Hearing loss Paternal Grandfather   . Hypertension Father   . Other Neg Hx     No Known Allergies  Current Outpatient  Medications on File Prior to Visit  Medication Sig Dispense Refill  . Oxcarbazepine (TRILEPTAL) 300 MG tablet TAKE 3 TABLETS BY MOUTH EVERY MORNING AND 3 TABLETS BY MOUTH AT BEDTIME 180 tablet 1   No current facility-administered medications on file prior to visit.     BP 116/70   Pulse 75   Temp 98.2 F (36.8 C) (Oral)   Ht 5\' 4"  (1.626 m)   Wt 229 lb (103.9 kg)   SpO2 98%   BMI 39.31 kg/m    Objective:   Physical Exam  Constitutional: She appears well-nourished.  Neck: Neck supple.  Cardiovascular: Normal rate.  Respiratory: Effort normal.  Skin: Skin is warm and dry.  Psychiatric: She has a normal mood and affect.           Assessment & Plan:

## 2018-07-08 NOTE — Patient Instructions (Signed)
You will be contacted regarding your referral to psychiatry.  Please let us know if you have not been contacted within one week.   I will be in touch with your medication plan once I have time to do some research.  It was a pleasure to see you today!

## 2018-07-08 NOTE — Assessment & Plan Note (Signed)
Unable to get connected with anyone at [facility] since her recent provider has passed away. PHQ 9 score of 15 and GAD 7 score of 8 today.  Doing well on Trileptal for mood swings. Discussed options for treatment, also consulted with local psychiatrist, we will start citalopram 20 mg daily.  Patient is to take 1/2 tablet daily for 6 days, then advance to 1 full tablet thereafter. We discussed possible side effects of headache, GI upset, drowsiness, and SI/HI. If thoughts of SI/HI develop, we discussed to present to the emergency immediately. Patient verbalized understanding.   Referral placed to psychiatry. We will plan to see her back in 4 weeks if she hasn't already connected with psychiatry.

## 2018-07-14 ENCOUNTER — Other Ambulatory Visit: Payer: Self-pay | Admitting: Primary Care

## 2018-07-14 DIAGNOSIS — Z0000 Encounter for general adult medical examination without abnormal findings: Secondary | ICD-10-CM

## 2018-07-16 ENCOUNTER — Other Ambulatory Visit: Payer: Self-pay

## 2018-07-16 ENCOUNTER — Other Ambulatory Visit (INDEPENDENT_AMBULATORY_CARE_PROVIDER_SITE_OTHER): Payer: 59

## 2018-07-16 DIAGNOSIS — Z0000 Encounter for general adult medical examination without abnormal findings: Secondary | ICD-10-CM | POA: Diagnosis not present

## 2018-07-16 LAB — LIPID PANEL
Cholesterol: 211 mg/dL — ABNORMAL HIGH (ref 0–200)
HDL: 47.9 mg/dL (ref >39.00)
LDL Cholesterol: 145 mg/dL — ABNORMAL HIGH (ref 0–99)
NonHDL: 163.18
Total CHOL/HDL Ratio: 4
Triglycerides: 89 mg/dL (ref 0.0–149.0)
VLDL: 17.8 mg/dL (ref 0.0–40.0)

## 2018-07-16 LAB — COMPREHENSIVE METABOLIC PANEL
ALT: 15 U/L (ref 0–35)
AST: 10 U/L (ref 0–37)
Albumin: 4.8 g/dL (ref 3.5–5.2)
Alkaline Phosphatase: 87 U/L (ref 39–117)
BUN: 16 mg/dL (ref 6–23)
CO2: 28 mEq/L (ref 19–32)
Calcium: 9.4 mg/dL (ref 8.4–10.5)
Chloride: 105 mEq/L (ref 96–112)
Creatinine, Ser: 0.8 mg/dL (ref 0.40–1.20)
GFR: 85.65 mL/min (ref >60.00)
Glucose, Bld: 82 mg/dL (ref 70–99)
Potassium: 4.2 mEq/L (ref 3.5–5.1)
Sodium: 138 mEq/L (ref 135–145)
Total Bilirubin: 0.3 mg/dL (ref 0.2–1.2)
Total Protein: 7.2 g/dL (ref 6.0–8.3)

## 2018-07-17 ENCOUNTER — Other Ambulatory Visit: Payer: 59

## 2018-07-23 ENCOUNTER — Encounter: Payer: Self-pay | Admitting: Primary Care

## 2018-07-23 ENCOUNTER — Ambulatory Visit (INDEPENDENT_AMBULATORY_CARE_PROVIDER_SITE_OTHER): Payer: 59 | Admitting: Primary Care

## 2018-07-23 VITALS — BP 110/70 | HR 65 | Temp 98.9°F | Ht 64.0 in | Wt 226.0 lb

## 2018-07-23 DIAGNOSIS — F329 Major depressive disorder, single episode, unspecified: Secondary | ICD-10-CM | POA: Diagnosis not present

## 2018-07-23 DIAGNOSIS — F419 Anxiety disorder, unspecified: Secondary | ICD-10-CM | POA: Diagnosis not present

## 2018-07-23 DIAGNOSIS — E785 Hyperlipidemia, unspecified: Secondary | ICD-10-CM | POA: Insufficient documentation

## 2018-07-23 DIAGNOSIS — F32A Depression, unspecified: Secondary | ICD-10-CM

## 2018-07-23 DIAGNOSIS — Z0000 Encounter for general adult medical examination without abnormal findings: Secondary | ICD-10-CM | POA: Diagnosis not present

## 2018-07-23 NOTE — Assessment & Plan Note (Signed)
Immunizations UTD. Pap smear UTD. Encouraged a healthy diet with less junk food and more vegetables/fruit/whole grains. Encouraged regular exercise. Exam unremarkable. Labs reviewed. Follow up in 1 year for CPE.

## 2018-07-23 NOTE — Assessment & Plan Note (Signed)
LDL of 145, likely related to unhealthy diet and lack of regular exercise. Strongly advised she work on both. Family history of hyperlipidemia.   Continue to monitor lipids over time. She is at child bearing age so avoid statin therapy at this time.

## 2018-07-23 NOTE — Progress Notes (Signed)
Subjective:    Patient ID: Jill Sandoval, female    DOB: [birth year], 28 y.o.   MRN: [id number]  HPI  Jill Sandoval is a 28 year old female who presents today for complete physical.  Immunizations: -Tetanus: Completed in 2018 -Influenza: Due this season   Diet: She endorses a healthy diet. She skips breakfast. Lunch consists of pasta, sandwich, school lunch food. Dinner consists of meat, some vegetables, starch (mac and cheese, potatoes). She snacks on cookies and chips. She drinks lemonade, soda, water, sweet tea.  Exercise: She is walking on occasion.   Eye exam: Completed about one year ago. Dental exam: Completes semi-annually Pap Smear: Completed in 2018   Review of Systems  Constitutional: Negative for unexpected weight change.  HENT: Negative for rhinorrhea.   Respiratory: Negative for cough and shortness of breath.   Cardiovascular: Negative for chest pain.  Gastrointestinal: Negative for constipation and diarrhea.  Genitourinary: Negative for difficulty urinating.       Chronic irregular menstrual cycles   Musculoskeletal: Negative for arthralgias and myalgias.  Skin: Negative for rash.  Allergic/Immunologic: Negative for environmental allergies.  Neurological: Negative for dizziness, numbness and headaches.  Psychiatric/Behavioral:       Doing better on citalopram 20 mg since added last visit. She is getting better sleep and rest.       Past Medical History:  Diagnosis Date  . Anxiety and depression   . Asthma    inhaler prn  . Fetal demise > 22 weeks, delivered, current hospitalization 11/04/2015  . Miscarriage      Social History   Socioeconomic History  . Marital status: Married    Spouse name: Not on file  . Number of children: Not on file  . Years of education: Not on file  . Highest education level: Not on file  Occupational History  . Not on file  Social Needs  . Financial resource strain: Not on file  . Food insecurity    Worry: Not on file     Inability: Not on file  . Transportation needs    Medical: Not on file    Non-medical: Not on file  Tobacco Use  . Smoking status: Former Smoker    Packs/day: 0.25    Types: Cigarettes    Quit date: 05/30/2015    Years since quitting: 3.1  . Smokeless tobacco: Never Used  Substance and Sexual Activity  . Alcohol use: No  . Drug use: No  . Sexual activity: Yes  Lifestyle  . Physical activity    Days per week: Not on file    Minutes per session: Not on file  . Stress: Not on file  Relationships  . Social Herbalist on phone: Not on file    Gets together: Not on file    Attends religious service: Not on file    Active member of club or organization: Not on file    Attends meetings of clubs or organizations: Not on file    Relationship status: Not on file  . Intimate partner violence    Fear of current or ex partner: Not on file    Emotionally abused: Not on file    Physically abused: Not on file    Forced sexual activity: Not on file  Other Topics Concern  . Not on file  Social History Narrative   Married.   2 children.   Works at [employer].   Enjoys camping, being outdoors.     Past  Surgical History:  Procedure Laterality Date  . CESAREAN SECTION N/A 03/24/2012   Procedure: CESAREAN SECTION;  Surgeon: Sharene Butters, MD;  Location: [facility];  Service: Obstetrics;  Laterality: N/A;  . CESAREAN SECTION N/A 09/24/2016   Procedure: CESAREAN SECTION;  Surgeon: Vanessa Kick, MD;  Location: [facility];  Service: Obstetrics;  Laterality: N/A;    Family History  Problem Relation Age of Onset  . Hypertension Maternal Grandmother   . Prostate cancer Maternal Grandfather   . Hearing loss Brother   . Hearing loss Paternal Grandfather   . Hypertension Father   . Other Neg Hx     No Known Allergies  Current Outpatient Medications on File Prior to Visit  Medication Sig Dispense Refill  . citalopram (CELEXA) 20 MG tablet Take 1 tablet (20 mg total) by mouth  daily. 30 tablet 1  . Oxcarbazepine (TRILEPTAL) 300 MG tablet TAKE 3 TABLETS BY MOUTH EVERY MORNING AND 3 TABLETS BY MOUTH AT BEDTIME 180 tablet 1   No current facility-administered medications on file prior to visit.     BP 110/70   Pulse 65   Temp 98.9 F (37.2 C) (Tympanic)   Ht _0  (1.626 m)   Wt 226 lb (102.5 kg)   SpO2 98%   BMI 38.79 kg/m    Objective:   Physical Exam  Constitutional: She is oriented to person, place, and time. She appears well-nourished.  HENT:  Mouth/Throat: No oropharyngeal exudate.  Eyes: Pupils are equal, round, and reactive to light. EOM are normal.  Neck: Neck supple. No thyromegaly present.  Cardiovascular: Normal rate and regular rhythm.  Respiratory: Effort normal and breath sounds normal.  GI: Soft. Bowel sounds are normal. There is no abdominal tenderness.  Musculoskeletal: Normal range of motion.  Neurological: She is alert and oriented to person, place, and time.  Skin: Skin is warm and dry.  Psychiatric: She has a normal mood and affect.           Assessment & Plan:

## 2018-07-23 NOTE — Assessment & Plan Note (Signed)
Improved with citalopram 20 mg, compliant to oxcarbazepine. Denies SI/HI. Continue same. Referral to psychiatry pending.

## 2018-07-23 NOTE — Patient Instructions (Signed)
Start exercising. You should be getting 150 minutes of moderate intensity exercise weekly.  It's important to improve your diet by reducing consumption of fast food, fried food, processed snack foods, sugary drinks. Increase consumption of fresh vegetables and fruits, whole grains, water.  Ensure you are drinking 64 ounces of water daily.  It was a pleasure to see you today!   Preventive Care 18-39 Years, Female Preventive care refers to lifestyle choices and visits with your health care provider that can promote health and wellness. What does preventive care include?   A yearly physical exam. This is also called an annual well check.  Dental exams once or twice a year.  Routine eye exams. Ask your health care provider how often you should have your eyes checked.  Personal lifestyle choices, including: ? Daily care of your teeth and gums. ? Regular physical activity. ? Eating a healthy diet. ? Avoiding tobacco and drug use. ? Limiting alcohol use. ? Practicing safe sex. ? Taking vitamin and mineral supplements as recommended by your health care provider. What happens during an annual well check? The services and screenings done by your health care provider during your annual well check will depend on your age, overall health, lifestyle risk factors, and family history of disease. Counseling Your health care provider may ask you questions about your:  Alcohol use.  Tobacco use.  Drug use.  Emotional well-being.  Home and relationship well-being.  Sexual activity.  Eating habits.  Work and work Statistician.  Method of birth control.  Menstrual cycle.  Pregnancy history. Screening You may have the following tests or measurements:  Height, weight, and BMI.  Diabetes screening. This is done by checking your blood sugar (glucose) after you have not eaten for a while (fasting).  Blood pressure.  Lipid and cholesterol levels. These may be checked every 5 years  starting at age 71.  Skin check.  Hepatitis C blood test.  Hepatitis B blood test.  Sexually transmitted disease (STD) testing.  BRCA-related cancer screening. This may be done if you have a family history of breast, ovarian, tubal, or peritoneal cancers.  Pelvic exam and Pap test. This may be done every 3 years starting at age 51. Starting at age 61, this may be done every 5 years if you have a Pap test in combination with an HPV test. Discuss your test results, treatment options, and if necessary, the need for more tests with your health care provider. Vaccines Your health care provider may recommend certain vaccines, such as:  Influenza vaccine. This is recommended every year.  Tetanus, diphtheria, and acellular pertussis (Tdap, Td) vaccine. You may need a Td booster every 10 years.  Varicella vaccine. You may need this if you have not been vaccinated.  HPV vaccine. If you are 70 or younger, you may need three doses over 6 months.  Measles, mumps, and rubella (MMR) vaccine. You may need at least one dose of MMR. You may also need a second dose.  Pneumococcal 13-valent conjugate (PCV13) vaccine. You may need this if you have certain conditions and were not previously vaccinated.  Pneumococcal polysaccharide (PPSV23) vaccine. You may need one or two doses if you smoke cigarettes or if you have certain conditions.  Meningococcal vaccine. One dose is recommended if you are age 34-21 years and a first-year college student living in a residence hall, or if you have one of several medical conditions. You may also need additional booster doses.  Hepatitis A vaccine. You may need  this if you have certain conditions or if you travel or work in places where you may be exposed to hepatitis A.  Hepatitis B vaccine. You may need this if you have certain conditions or if you travel or work in places where you may be exposed to hepatitis B.  Haemophilus influenzae type b (Hib) vaccine. You  may need this if you have certain risk factors. Talk to your health care provider about which screenings and vaccines you need and how often you need them. This information is not intended to replace advice given to you by your health care provider. Make sure you discuss any questions you have with your health care provider. Document Released: 03/13/2001 Document Revised: 08/28/2016 Document Reviewed: 11/16/2014 Elsevier Interactive Patient Education  2019 [employer].

## 2018-07-28 ENCOUNTER — Other Ambulatory Visit: Payer: Self-pay

## 2018-07-28 ENCOUNTER — Telehealth: Payer: Self-pay | Admitting: Psychiatry

## 2018-07-28 MED ORDER — OXCARBAZEPINE 300 MG PO TABS: ORAL_TABLET | ORAL | 0 refills | Status: DC

## 2018-07-28 NOTE — Telephone Encounter (Signed)
Refill submitted for Trileptal

## 2018-07-28 NOTE — Telephone Encounter (Signed)
Patient need refill on Trylictal to be sent to [place] pt has appt., w/Dr. Creig Hines 07/02

## 2018-07-31 ENCOUNTER — Ambulatory Visit: Payer: 59 | Admitting: Psychiatry

## 2018-07-31 ENCOUNTER — Other Ambulatory Visit: Payer: Self-pay

## 2018-08-05 ENCOUNTER — Telehealth: Payer: Self-pay | Admitting: Primary Care

## 2018-08-05 NOTE — Telephone Encounter (Signed)
fmla paperwork on cart to be delivered by carrie °

## 2018-08-05 NOTE — Telephone Encounter (Signed)
Paperwork faxed Copy for pt Copy for scan  Pt aware 

## 2018-08-05 NOTE — Telephone Encounter (Signed)
Completed and placed on [facility] desk. 

## 2018-08-22 ENCOUNTER — Other Ambulatory Visit: Payer: Self-pay

## 2018-08-22 ENCOUNTER — Ambulatory Visit (INDEPENDENT_AMBULATORY_CARE_PROVIDER_SITE_OTHER): Payer: 59 | Admitting: Family Medicine

## 2018-08-22 ENCOUNTER — Encounter: Payer: Self-pay | Admitting: Family Medicine

## 2018-08-22 VITALS — BP 106/78 | HR 77 | Temp 98.4°F | Resp 18 | Ht 64.0 in | Wt 228.4 lb

## 2018-08-22 DIAGNOSIS — H9202 Otalgia, left ear: Secondary | ICD-10-CM | POA: Diagnosis not present

## 2018-08-22 MED ORDER — NEOMYCIN-POLYMYXIN-HC 3.5-10000-1 OT SOLN: 4.0000 [drp] | Freq: Three times a day (TID) | OTIC | 0 refills | Status: DC

## 2018-08-22 NOTE — Patient Instructions (Signed)
Possible wax causing ear canal or drum irritation and symptoms. Irrigation performed today but small amount of wax remains - treat with ear drops sent to pharmacy.  May use tylenol or ibuprofen as needed. If not improving with this, let us know to consider ENT eval.

## 2018-08-22 NOTE — Progress Notes (Signed)
This visit was conducted in person.  BP 106/78   Pulse 77   Temp 98.4 F (36.9 C)   Resp 18   Ht 5\' 4"  (1.626 m)   Wt 228 lb 6 oz (103.6 kg)   LMP 08/09/2018   BMI 39.20 kg/m    CC: L ear pain Subjective:    Patient ID: Jill Sandoval, female    DOB: 1990-03-05, 28 y.o.   MRN: [id number]  HPI: Jill Sandoval is a 28 y.o. female presenting on 08/22/2018 for Ear Pain (started today. Left ear pain. Headache on the left side of the face)   1d h/o L earache associated with headache. Mild muffled hearing on left. Yawning increases pain. Also noticing intermittent nausea.   H/o rare migraines, this feels different.  No recent swimming.  Hasn't tried anything for this yet.  No fevers/chills.  No recent nasal congestion.  No drainage from ears.  No ringing in the ears.      Relevant past medical, surgical, family and social history reviewed and updated as indicated. Interim medical history since our last visit reviewed. Allergies and medications reviewed and updated. Outpatient Medications Prior to Visit  Medication Sig Dispense Refill  . citalopram (CELEXA) 20 MG tablet Take 1 tablet (20 mg total) by mouth daily. 30 tablet 1  . Oxcarbazepine (TRILEPTAL) 300 MG tablet TAKE 3 TABLETS BY MOUTH EVERY MORNING AND 3 TABLETS BY MOUTH AT BEDTIME 180 tablet 0   No facility-administered medications prior to visit.      Per HPI unless specifically indicated in ROS section below Review of Systems Objective:    BP 106/78   Pulse 77   Temp 98.4 F (36.9 C)   Resp 18   Ht 5\' 4"  (1.626 m)   Wt 228 lb 6 oz (103.6 kg)   LMP 08/09/2018   BMI 39.20 kg/m   Wt Readings from Last 3 Encounters:  08/22/18 228 lb 6 oz (103.6 kg)  07/23/18 226 lb (102.5 kg)  07/08/18 229 lb (103.9 kg)    Physical Exam Vitals signs and nursing note reviewed.  Constitutional:      General: She is not in acute distress.    Appearance: Normal appearance. She is well-developed. She is not  ill-appearing.  HENT:     Head: Normocephalic and atraumatic.     Comments: No pain or subluxation of TMJs bilaterally    Right Ear: Hearing, tympanic membrane, ear canal and external ear normal. There is no impacted cerumen. Tympanic membrane is not perforated.     Left Ear: Hearing, tympanic membrane, ear canal and external ear normal. There is impacted cerumen (small amt of hard cerumen deep inferior canal near TM). Tympanic membrane is not perforated.     Ears:     Comments: Pearly grey TMs bilaterally    Nose: Nose normal. No mucosal edema or congestion.     Right Sinus: No maxillary sinus tenderness or frontal sinus tenderness.     Left Sinus: No maxillary sinus tenderness or frontal sinus tenderness.     Mouth/Throat:     Mouth: Mucous membranes are moist.     Pharynx: Uvula midline. No oropharyngeal exudate or posterior oropharyngeal erythema.     Tonsils: No tonsillar abscesses.     Comments: Braces in place. L lower 1st molar tender to palpation Eyes:     General: No scleral icterus.    Extraocular Movements: Extraocular movements intact.     Conjunctiva/sclera: Conjunctivae normal.  Pupils: Pupils are equal, round, and reactive to light.  Neck:     Musculoskeletal: Normal range of motion and neck supple.  Lymphadenopathy:     Cervical: No cervical adenopathy.  Skin:    General: Skin is warm and dry.     Findings: No rash.  Neurological:     Mental Status: She is alert.       Assessment & Plan:   Problem List Items Addressed This Visit    Earache on left    Small amt cerumen present deep to canal ?irritation from this. Otherwise benign exam. S/p irrigation performed today with limited success. Supportive care reviewed - ibuprofen PRN. Update if not improving with this, consider ENT referral.           Meds ordered this encounter  Medications  . neomycin-polymyxin-hydrocortisone (CORTISPORIN) OTIC solution    Sig: Place 4 drops into the left ear 3 (three)  times daily.    Dispense:  10 mL    Refill:  0   No orders of the defined types were placed in this encounter.   Follow up plan: Return if symptoms worsen or fail to improve.  Jill BoydenJavier Eliska Hamil, MD

## 2018-08-22 NOTE — Assessment & Plan Note (Addendum)
Small amt cerumen present deep to canal ?irritation from this. Otherwise benign exam. S/p irrigation performed today with limited success. Supportive care reviewed - ibuprofen PRN. Update if not improving with this, consider ENT referral.

## 2018-09-01 ENCOUNTER — Encounter: Payer: Self-pay | Admitting: Family Medicine

## 2018-09-01 ENCOUNTER — Other Ambulatory Visit: Payer: Self-pay

## 2018-09-01 ENCOUNTER — Ambulatory Visit: Payer: 59 | Admitting: Primary Care

## 2018-09-01 ENCOUNTER — Telehealth: Payer: Self-pay | Admitting: Psychiatry

## 2018-09-01 ENCOUNTER — Ambulatory Visit (INDEPENDENT_AMBULATORY_CARE_PROVIDER_SITE_OTHER): Payer: 59 | Admitting: Family Medicine

## 2018-09-01 VITALS — BP 108/60 | HR 69 | Temp 98.4°F | Ht 64.0 in | Wt 229.5 lb

## 2018-09-01 DIAGNOSIS — M7751 Other enthesopathy of right foot: Secondary | ICD-10-CM | POA: Diagnosis not present

## 2018-09-01 MED ORDER — OXCARBAZEPINE 300 MG PO TABS: ORAL_TABLET | ORAL | 0 refills | Status: DC

## 2018-09-01 NOTE — Telephone Encounter (Signed)
Pt made an appt. For 09/10/18 but needs a refill on Trileptal sent [facility] outpatient pharmacy.

## 2018-09-01 NOTE — Patient Instructions (Signed)
Alleve 2 tabs by mouth two times a day over the counter: Take at least for 2 - 3 weeks. This is equal to a prescripton strength dose (GENERIC CHEAPER EQUIVALENT IS NAPROXEN SODIUM)   Motrin 800 mg recommended TID. (Over the counter Motrin, Advil, or Generic Ibuprofen 200 mg tablets. 3-4 tablets by mouth 3 times a day. This equals a prescription strength dose.)    TULI'S HEEL CUPS

## 2018-09-01 NOTE — Telephone Encounter (Signed)
Refill sent.

## 2018-09-01 NOTE — Progress Notes (Signed)
Gresham Caetano T. Nikoli Nasser, MD Primary Care and Sports Medicine [facility] [address] [place], [id number] Phone: [phone]  FAX: [phone]  Eleonore ChiquitoCourtney A Desrochers - 28 y.o. female  MRN [id number]  Date of Birth: 10/25/1990  Visit Date: 09/01/2018  PCP: Doreene Nestlark, Katherine K, NP  Referred by: Doreene Nestlark, Katherine K, NP  Chief Complaint  Patient presents with  . Foot Pain    Right Heel   Subjective:   Lily PeerCourtney A Varnell is a 28 y.o. very pleasant female patient with Body mass index is 39.39 kg/m. who presents with the following:  R achilles tendinopathy. Ice all weekend.  Foot flared up a lot over the weekend.  This is only been going on for about 4 5 days.  She has some pain at the Achilles and just anterior to this as well.  No specific injury, and she does not have a prior history of this at all.  She is otherwise generally pretty healthy.  4 or 5 days.  achil and burs  Past Medical History, Surgical History, Social History, Family History, Problem List, Medications, and Allergies have been reviewed and updated if relevant.  Patient Active Problem List   Diagnosis Date Noted  . Earache on left 08/22/2018  . Preventative health care 07/23/2018  . Hyperlipidemia 07/23/2018  . Anxiety and depression 06/25/2018    Past Medical History:  Diagnosis Date  . Anxiety and depression   . Asthma    inhaler prn  . Fetal demise > 22 weeks, delivered, current hospitalization 11/04/2015  . Miscarriage     Past Surgical History:  Procedure Laterality Date  . CESAREAN SECTION N/A 03/24/2012   Procedure: CESAREAN SECTION;  Surgeon: Mickel Baasichard D Kaplan, MD;  Location: [facility];  Service: Obstetrics;  Laterality: N/A;  . CESAREAN SECTION N/A 09/24/2016   Procedure: CESAREAN SECTION;  Surgeon: Waynard Reedsoss, [name], MD;  [facility];  Service: Obstetrics;  Laterality: N/A;    Social History   Socioeconomic History  . Marital status: Married   Spouse name: Not on file  . Number of children: Not on file  . Years of education: Not on file  . Highest education level: Not on file  Occupational History  . Not on file  Social Needs  . Financial resource strain: Not on file  . Food insecurity    Worry: Not on file    Inability: Not on file  . Transportation needs    Medical: Not on file    Non-medical: Not on file  Tobacco Use  . Smoking status: Former Smoker    Packs/day: 0.25    Types: Cigarettes    Quit date: 05/30/2015    Years since quitting: 3.2  . Smokeless tobacco: Never Used  Substance and Sexual Activity  . Alcohol use: No  . Drug use: No  . Sexual activity: Yes  Lifestyle  . Physical activity    Days per week: Not on file    Minutes per session: Not on file  . Stress: Not on file  Relationships  . Social Musicianconnections    Talks on phone: Not on file    Gets together: Not on file    Attends religious service: Not on file    Active member of club or organization: Not on file    Attends meetings of clubs or organizations: Not on file    Relationship status: Not on file  . Intimate partner violence    Fear of  current or ex partner: Not on file    Emotionally abused: Not on file    Physically abused: Not on file    Forced sexual activity: Not on file  Other Topics Concern  . Not on file  Social History Narrative   Married.   2 children.   Works at [employer].   Enjoys camping, being outdoors.     Family History  Problem Relation Age of Onset  . Hypertension Maternal Grandmother   . Prostate cancer Maternal Grandfather   . Hearing loss Brother   . Hearing loss Paternal Grandfather   . Hypertension Father   . Other Neg Hx     No Known Allergies  Medication list reviewed and updated in full in [place].  GEN: No fevers, chills. Nontoxic. Primarily MSK c/o today. MSK: Detailed in the HPI GI: tolerating PO intake without difficulty Neuro: No numbness, parasthesias, or tingling associated.  Otherwise the pertinent positives of the ROS are noted above.   Objective:   BP 108/60   Pulse 69   Temp 98.4 F (36.9 C) (Temporal)   Ht 5\' 4"  (1.626 m)   Wt 229 lb 8 oz (104.1 kg)   LMP 08/09/2018   SpO2 100%   BMI 39.39 kg/m    GEN: Well-developed,well-nourished,in no acute distress; alert,appropriate and cooperative throughout examination HEENT: Normocephalic and atraumatic without obvious abnormalities. Ears, externally no deformities PULM: Breathing comfortably in no respiratory distress EXT: No clubbing, cyanosis, or edema PSYCH: Normally interactive. Cooperative during the interview. Pleasant. Friendly and conversant. Not anxious or depressed appearing. Normal, full affect.  Foot: Right Echymosis: no Edema: no ROM: full LE B Gait: heel toe, non-antalgic MT pain: no Callus pattern: none Lateral Mall: NT Medial Mall: NT Talus: NT Navicular: NT Cuboid: NT Calcaneous: NT Metatarsals: NT 5th MT: NT Phalanges: NT Achilles: PAINFUL TO PALPATE AT INSERTION ON right, SMALL NODULE.  There is also tenderness anterior to the Achilles. Plantar Fascia: NT Fat Pad: NT Peroneals: NT Post Tib: NT Great Toe: Nml motion Ant Drawer: neg ATFL: NT CFL: NT Deltoid: NT Sensation: intact   Radiology: No results found.  Assessment and Plan:     ICD-10-CM   1. Retrocalcaneal bursitis (back of heel), right  M77.51    I think that this is more retrocalcaneal bursitis with a lesser component of Achilles tendinopathy.  Ice, heel lift, NSAIDs, Tylenol.  Basic rehab.  If still symptomatic in 4 weeks follow-up.  Follow-up: No follow-ups on file.  No orders of the defined types were placed in this encounter.  No orders of the defined types were placed in this encounter.   Signed,  Maud Deed. Amelie Caracci, MD   Outpatient Encounter Medications as of 09/01/2018  Medication Sig  . citalopram (CELEXA) 20 MG tablet Take 1 tablet (20 mg total) by mouth daily.  .  Oxcarbazepine (TRILEPTAL) 300 MG tablet TAKE 3 TABLETS BY MOUTH EVERY MORNING AND 3 TABLETS BY MOUTH AT BEDTIME  . [DISCONTINUED] neomycin-polymyxin-hydrocortisone (CORTISPORIN) OTIC solution Place 4 drops into the left ear 3 (three) times daily.   No facility-administered encounter medications on file as of 09/01/2018.

## 2018-09-08 ENCOUNTER — Other Ambulatory Visit: Payer: Self-pay | Admitting: Primary Care

## 2018-09-08 DIAGNOSIS — F329 Major depressive disorder, single episode, unspecified: Secondary | ICD-10-CM

## 2018-09-08 DIAGNOSIS — F32A Depression, unspecified: Secondary | ICD-10-CM

## 2018-09-08 DIAGNOSIS — F419 Anxiety disorder, unspecified: Secondary | ICD-10-CM

## 2018-09-10 ENCOUNTER — Ambulatory Visit (INDEPENDENT_AMBULATORY_CARE_PROVIDER_SITE_OTHER): Payer: 59 | Admitting: Psychiatry

## 2018-09-10 DIAGNOSIS — F39 Unspecified mood [affective] disorder: Secondary | ICD-10-CM

## 2018-09-26 ENCOUNTER — Ambulatory Visit: Payer: 59 | Admitting: Psychiatry

## 2018-10-07 ENCOUNTER — Other Ambulatory Visit: Payer: Self-pay

## 2018-10-07 ENCOUNTER — Telehealth: Payer: Self-pay | Admitting: Psychiatry

## 2018-10-07 DIAGNOSIS — F32A Depression, unspecified: Secondary | ICD-10-CM

## 2018-10-07 DIAGNOSIS — F419 Anxiety disorder, unspecified: Secondary | ICD-10-CM

## 2018-10-07 DIAGNOSIS — F329 Major depressive disorder, single episode, unspecified: Secondary | ICD-10-CM

## 2018-10-07 MED ORDER — OXCARBAZEPINE 300 MG PO TABS: ORAL_TABLET | ORAL | 0 refills | Status: DC

## 2018-10-07 NOTE — Telephone Encounter (Signed)
Pt left v-mail requesting refill on Trileptal. Have an appointment 9/16 with new provider need Rx until appt. Please send to [place]

## 2018-10-07 NOTE — Telephone Encounter (Signed)
Refill sent.

## 2018-10-08 ENCOUNTER — Ambulatory Visit: Payer: 59 | Admitting: Family Medicine

## 2018-10-13 ENCOUNTER — Encounter: Payer: Self-pay | Admitting: Primary Care

## 2018-10-13 ENCOUNTER — Other Ambulatory Visit: Payer: Self-pay

## 2018-10-13 ENCOUNTER — Ambulatory Visit (INDEPENDENT_AMBULATORY_CARE_PROVIDER_SITE_OTHER): Payer: 59 | Admitting: Primary Care

## 2018-10-13 DIAGNOSIS — M779 Enthesopathy, unspecified: Secondary | ICD-10-CM

## 2018-10-13 NOTE — Progress Notes (Signed)
Subjective:    Patient ID: Jill Sandoval, female    DOB: 08/20/1990, 28 y.o.   MRN: [id number]  HPI  Jill Sandoval is a 28 year old female who presents today with a chief complaint of foot and ankle pain.  Her pain is located to the right posterior ankle starting from the bottom of the achilles tendon down to the heel.   She saw Dr. Patsy Lageropland on 09/01/18 for the same complaints. She thought she may have right achilles tendinopathy given pain at the achilles and anteriorly. Dr. Patsy Lageropland diagnosed retrocalcaneal bursitis to the back of the right heel and treated with conservative measures such as ice, heel lift, NSAID's, Tylenol, rehab. She was told to follow up four weeks later if symptoms persisted.   Since her last visit she continues to notice pain to the posterior right foot/heel that has not improved. She tried all of the recommendations that were provided, even purchased an ankle/foot brace with only temporary improvement. She continues to experience pain with every step forward. She will wake in the morning and will "hobble" out of bed due to pain.   She denies injury/trauma, numbness/tingling, radiation of pain, color changes to skin.  Review of Systems  Musculoskeletal:       Right posterior ankle pain  Skin: Negative for color change.  Neurological: Negative for numbness.       Past Medical History:  Diagnosis Date  . Anxiety and depression   . Asthma    inhaler prn  . Fetal demise > 22 weeks, delivered, current hospitalization 11/04/2015  . Miscarriage      Social History   Socioeconomic History  . Marital status: Married    Spouse name: Not on file  . Number of children: Not on file  . Years of education: Not on file  . Highest education level: Not on file  Occupational History  . Not on file  Social Needs  . Financial resource strain: Not on file  . Food insecurity    Worry: Not on file    Inability: Not on file  . Transportation needs    Medical: Not on file     Non-medical: Not on file  Tobacco Use  . Smoking status: Former Smoker    Packs/day: 0.25    Types: Cigarettes    Quit date: 05/30/2015    Years since quitting: 3.3  . Smokeless tobacco: Never Used  Substance and Sexual Activity  . Alcohol use: No  . Drug use: No  . Sexual activity: Yes  Lifestyle  . Physical activity    Days per week: Not on file    Minutes per session: Not on file  . Stress: Not on file  Relationships  . Social Musicianconnections    Talks on phone: Not on file    Gets together: Not on file    Attends religious service: Not on file    Active member of club or organization: Not on file    Attends meetings of clubs or organizations: Not on file    Relationship status: Not on file  . Intimate partner violence    Fear of current or ex partner: Not on file    Emotionally abused: Not on file    Physically abused: Not on file    Forced sexual activity: Not on file  Other Topics Concern  . Not on file  Social History Narrative   Married.   2 children.   Works at [employer].   Enjoys camping,  being outdoors.     Past Surgical History:  Procedure Laterality Date  . CESAREAN SECTION N/A 03/24/2012   Procedure: CESAREAN SECTION;  Surgeon: Sharene Butters, MD;  Location: [facility];  Service: Obstetrics;  Laterality: N/A;  . CESAREAN SECTION N/A 09/24/2016   Procedure: CESAREAN SECTION;  Surgeon: Vanessa Kick, MD;  Location: [facility];  Service: Obstetrics;  Laterality: N/A;    Family History  Problem Relation Age of Onset  . Hypertension Maternal Grandmother   . Prostate cancer Maternal Grandfather   . Hearing loss Brother   . Hearing loss Paternal Grandfather   . Hypertension Father   . Other Neg Hx     No Known Allergies  Current Outpatient Medications on File Prior to Visit  Medication Sig Dispense Refill  . citalopram (CELEXA) 20 MG tablet TAKE 1 TABLET BY MOUTH DAILY 30 tablet 2  . Oxcarbazepine (TRILEPTAL) 300 MG tablet TAKE 3 TABLETS BY MOUTH  EVERY MORNING AND 3 TABLETS BY MOUTH AT BEDTIME 180 tablet 0   No current facility-administered medications on file prior to visit.     BP 110/70   Pulse 77   Temp 97.7 F (36.5 C) (Temporal)   Ht 5\' 4"  (1.626 m)   Wt 234 lb 4 oz (106.3 kg)   SpO2 98%   BMI 40.21 kg/m    Objective:   Physical Exam  Constitutional: She appears well-nourished.  Cardiovascular:  Pulses:      Dorsalis pedis pulses are 2+ on the right side.       Posterior tibial pulses are 2+ on the right side.  Respiratory: Effort normal.  Musculoskeletal:     Right ankle: She exhibits normal range of motion, no swelling and normal pulse.       Feet:     Comments: Pain to right posterior ankle with plantar flexion   Skin: Skin is warm and dry. No erythema.           Assessment & Plan:

## 2018-10-13 NOTE — Patient Instructions (Signed)
I will touch base with Dr. Lorelei Pont today as discussed.  We may need to send you to podiatry, but I'll be in touch.  It was a pleasure to see you today!

## 2018-10-13 NOTE — Assessment & Plan Note (Signed)
Suspect tendonitis to right posterior ankle given lack of trauma, deformity, color change, and radiculopathy. Exam today with obvious discomfort with ROM, some with ambulation.  Given that she's had temporary to little improvement with conservative treatment she may require steroid injections/specialist evaluation. Will consult with Sports medicine for other recommendations, however, may need podiatry evaluation.  She agrees with plan.

## 2018-10-14 ENCOUNTER — Other Ambulatory Visit (INDEPENDENT_AMBULATORY_CARE_PROVIDER_SITE_OTHER): Payer: 59

## 2018-10-14 DIAGNOSIS — R202 Paresthesia of skin: Secondary | ICD-10-CM

## 2018-10-14 DIAGNOSIS — M79671 Pain in right foot: Secondary | ICD-10-CM

## 2018-10-14 LAB — BASIC METABOLIC PANEL
BUN: 17 mg/dL (ref 6–23)
CO2: 29 mEq/L (ref 19–32)
Calcium: 9.3 mg/dL (ref 8.4–10.5)
Chloride: 105 mEq/L (ref 96–112)
Creatinine, Ser: 0.85 mg/dL (ref 0.40–1.20)
GFR: 79.72 mL/min (ref >60.00)
Glucose, Bld: 81 mg/dL (ref 70–99)
Potassium: 4.1 mEq/L (ref 3.5–5.1)
Sodium: 139 mEq/L (ref 135–145)

## 2018-10-14 LAB — CBC
HCT: 40.3 % (ref 36.0–46.0)
Hemoglobin: 13.2 g/dL (ref 12.0–15.0)
MCHC: 32.9 g/dL (ref 30.0–36.0)
MCV: 86.4 fl (ref 78.0–100.0)
Platelets: 291 10*3/uL (ref 150.0–400.0)
RBC: 4.66 Mil/uL (ref 3.87–5.11)
RDW: 13.2 % (ref 11.5–15.5)
WBC: 9.5 10*3/uL (ref 4.0–10.5)

## 2018-10-14 LAB — VITAMIN B12: Vitamin B-12: 237 pg/mL (ref 211–911)

## 2018-10-15 ENCOUNTER — Other Ambulatory Visit (INDEPENDENT_AMBULATORY_CARE_PROVIDER_SITE_OTHER): Payer: 59

## 2018-10-15 ENCOUNTER — Other Ambulatory Visit: Payer: Self-pay

## 2018-10-15 ENCOUNTER — Ambulatory Visit (INDEPENDENT_AMBULATORY_CARE_PROVIDER_SITE_OTHER): Payer: 59 | Admitting: Psychiatry

## 2018-10-15 ENCOUNTER — Encounter: Payer: Self-pay | Admitting: Psychiatry

## 2018-10-15 DIAGNOSIS — Z0000 Encounter for general adult medical examination without abnormal findings: Secondary | ICD-10-CM

## 2018-10-15 DIAGNOSIS — F3181 Bipolar II disorder: Secondary | ICD-10-CM

## 2018-10-15 DIAGNOSIS — F17200 Nicotine dependence, unspecified, uncomplicated: Secondary | ICD-10-CM

## 2018-10-15 DIAGNOSIS — F410 Panic disorder [episodic paroxysmal anxiety] without agoraphobia: Secondary | ICD-10-CM | POA: Diagnosis not present

## 2018-10-15 LAB — TSH: TSH: 1.18 u[IU]/mL (ref 0.35–4.50)

## 2018-10-15 MED ORDER — RISPERIDONE 0.25 MG PO TABS: 0.2500 mg | ORAL_TABLET | Freq: Every day | ORAL | 1 refills | Status: DC

## 2018-10-15 NOTE — Progress Notes (Signed)
Virtual Visit via Video Note  I connected with Jill Sandoval on 10/15/18 at  9:00 AM EDT by a video enabled telemedicine application and verified that I am speaking with the correct person using two identifiers.   I discussed the limitations of evaluation and management by telemedicine and the availability of in person appointments. The patient expressed understanding and agreed to proceed.   I discussed the assessment and treatment plan with the patient. The patient was provided an opportunity to ask questions and all were answered. The patient agreed with the plan and demonstrated an understanding of the instructions.   The patient was advised to call back or seek an in-person evaluation if the symptoms worsen or if the condition fails to improve as anticipated.   Psychiatric Initial Adult Assessment   Patient Identification: Jill Sandoval MRN:  [id number] Date of Evaluation:  10/15/2018 Referral Source: Jerrel Ivory, NP Chief Complaint:   Chief Complaint    Establish Care     Visit Diagnosis: R/O PTSD   ICD-10-CM   1. Bipolar 2 disorder (HCC)  F31.81 risperiDONE (RISPERDAL) 0.25 MG tablet   mixed, mild  2. Panic attacks  F41.0   3. Tobacco use disorder  F17.200     History of Present Illness: Jill Sandoval is a 28 year old Caucasian female, married, employed, currently lives in [place], has a history of mood lability, asthma, was evaluated by telemedicine today.  Patient reports she has been struggling with mood swings since the past several years.  She reports she was under the care of provider at [facility] psychiatry.  She reports her provider passed away and hence she decided to establish care with our clinic.  She describes her mood swings as episodes of feeling irritable, depressed, lack of energy, sadness, lack of motivation as well as episodes of high energy, euphoria, decreased need for sleep, talking too fast, racing thoughts.  She reports she has gotten into fights and  arguments on a regular basis.  She has had trouble at home and at work due to her irritability.  Patient also reports a history of trauma.  She reports she lost her baby at 20 weeks of pregnancy 3 years ago.  She reports she constantly has flashbacks about the same and also intrusive memories.  She hence has been very overprotective of her children who are 21 and 58 years old.  She is often hypervigilant.  She also reports some avoidance.  She however denies any nightmares or sleep problems.  Patient reports panic attacks.  She reports she has had panic attacks at least 2-3 times in the past.  She reports racing heart rate, shortness of breath and she copes with it by deep breathing.  Her husband also helps for by asking her to relax and helps her calm down.  Patient denies any suicidality, homicidality or perceptual disturbances.  Patient denies any inpatient mental health admissions.  Patient denies any substance abuse problems.  Patient reports she currently takes Celexa, Trileptal for her mood symptoms.  It does help to some extent however she continues to struggle with irritability and is very argumentative and has been having problems at work and in her relationships.  Patient hence is interested in medication management.  Associated Signs/Symptoms: Depression Symptoms:  depressed mood, insomnia, psychomotor agitation, difficulty concentrating, anxiety, panic attacks, (Hypo) Manic Symptoms:  Distractibility, Elevated Mood, Flight of Ideas, Impulsivity, Irritable Mood, Labiality of Mood, Anxiety Symptoms:  Panic Symptoms, Psychotic Symptoms:  denies PTSD Symptoms: Had a traumatic exposure:  as noted above Re-experiencing:  Flashbacks Intrusive Thoughts Hypervigilance:  Yes Hyperarousal:  Difficulty Concentrating Irritability/Anger Avoidance:  Foreshortened Future  Past Psychiatric History: Patient with history of mood lability, possible history of borderline personality  disorder.  Patient denies inpatient mental health admissions.  Patient denies suicide attempts.  Patient was under the care of [facility].  Previous Psychotropic Medications: Yes Celexa, Trileptal  Substance Abuse History in the last 12 months:  No.  Consequences of Substance Abuse: Negative  Past Medical History:  Past Medical History:  Diagnosis Date  . Anxiety and depression   . Asthma    inhaler prn  . Fetal demise > 22 weeks, delivered, current hospitalization 11/04/2015  . Miscarriage     Past Surgical History:  Procedure Laterality Date  . CESAREAN SECTION N/A 03/24/2012   Procedure: CESAREAN SECTION;  Surgeon: Mickel Baas, MD;  Location: [facility];  Service: Obstetrics;  Laterality: N/A;  . CESAREAN SECTION N/A 09/24/2016   Procedure: CESAREAN SECTION;  Surgeon: Waynard Reeds, MD;  Location: [facility];  Service: Obstetrics;  Laterality: N/A;    Family Psychiatric History: Maternal aunt-mental health problems  Family History:  Family History  Problem Relation Age of Onset  . Hypertension Maternal Grandmother   . Prostate cancer Maternal Grandfather   . Hearing loss Brother   . Hearing loss Paternal Grandfather   . Hypertension Father   . Other Neg Hx     Social History:   Social History   Socioeconomic History  . Marital status: Married    Spouse name: Not on file  . Number of children: 2  . Years of education: Not on file  . Highest education level: Not on file  Occupational History  . Not on file  Social Needs  . Financial resource strain: Not on file  . Food insecurity    Worry: Not on file    Inability: Not on file  . Transportation needs    Medical: Not on file    Non-medical: Not on file  Tobacco Use  . Smoking status: Current Every Day Smoker    Packs/day: 0.25    Types: Cigarettes  . Smokeless tobacco: Never Used  Substance and Sexual Activity  . Alcohol use: No  . Drug use: No  . Sexual activity: Yes  Lifestyle  .  Physical activity    Days per week: Not on file    Minutes per session: Not on file  . Stress: Not on file  Relationships  . Social Musician on phone: Not on file    Gets together: Not on file    Attends religious service: Not on file    Active member of club or organization: Not on file    Attends meetings of clubs or organizations: Not on file    Relationship status: Not on file  Other Topics Concern  . Not on file  Social History Narrative   Married.   2 children.   Works at [employer].   Enjoys camping, being outdoors.     Additional Social History: Patient is married.  She lives with her husband and 2 children aged 4 and 85 years old.  She was raised by both parents.  Her parents are supportive.  She is a Engineer, agricultural.  She is employed with [employer].  Allergies:  No Known Allergies  Metabolic Disorder Labs: Lab Results  Component Value Date   HGBA1C 5.0 06/30/2015   No results found for: PROLACTIN  Lab Results  Component Value Date   CHOL 211 (H) 07/16/2018   TRIG 89.0 07/16/2018   HDL 47.90 07/16/2018   CHOLHDL 4 07/16/2018   VLDL 17.8 07/16/2018   LDLCALC 145 (H) 07/16/2018   LDLCALC 103 (H) 06/30/2015   Lab Results  Component Value Date   TSH 1.270 06/30/2015    Therapeutic Level Labs: No results found for: LITHIUM No results found for: CBMZ No results found for: VALPROATE  Current Medications: Current Outpatient Medications  Medication Sig Dispense Refill  . citalopram (CELEXA) 20 MG tablet TAKE 1 TABLET BY MOUTH DAILY 30 tablet 2  . Oxcarbazepine (TRILEPTAL) 300 MG tablet TAKE 3 TABLETS BY MOUTH EVERY MORNING AND 3 TABLETS BY MOUTH AT BEDTIME 180 tablet 0  . risperiDONE (RISPERDAL) 0.25 MG tablet Take 1 tablet (0.25 mg total) by mouth at bedtime. 30 tablet 1   No current facility-administered medications for this visit.     Musculoskeletal: Strength & Muscle Tone: UTA Gait & Station: normal Patient leans: N/A  Psychiatric  Specialty Exam: Review of Systems  Psychiatric/Behavioral: Positive for depression. The patient is nervous/anxious and has insomnia.   All other systems reviewed and are negative.   not currently breastfeeding.There is no height or weight on file to calculate BMI.  General Appearance: Casual  Eye Contact:  Fair  Speech:  Clear and Coherent  Volume:  Normal  Mood:  Anxious, Depressed and Irritable  Affect:  Appropriate  Thought Process:  Goal Directed and Descriptions of Associations: Intact  Orientation:  Full (Time, Place, and Person)  Thought Content:  Logical  Suicidal Thoughts:  No  Homicidal Thoughts:  No  Memory:  Immediate;   Fair Recent;   Fair Remote;   Fair  Judgement:  Fair  Insight:  Fair  Psychomotor Activity:  Normal  Concentration:  Concentration: Fair and Attention Span: Fair  Recall:  FiservFair  Fund of Knowledge:Fair  Language: Fair  Akathisia:  No  Handed:  Right  AIMS (if indicated): Denies tremors, rigidity, stiffness  Assets:  Communication Skills Desire for Improvement Housing Intimacy Social Support  ADL's:  Intact  Cognition: WNL  Sleep:  Restless on and off   Screenings: GAD-7     [facility]  Total GAD-7 Score  8    PHQ2-9     [facility]  PHQ-2 Total Score  3  PHQ-9 Total Score  15      Assessment and Plan: Toni AmendCourtney is a 28 year old Caucasian female, married, employed, lives in [place], has a history of mood lability was evaluated by telemedicine today.  Patient is biologically predisposed given her history of trauma and family history.  Patient also has current psychosocial stressors of the pandemic, work and relationship problems due to her mood swings.  Patient will benefit from medication readjustment as well as psychotherapy sessions.  She denies any history of suicide attempts or suicidal ideation as well as denies any substance abuse  problems and has good social support system which are all positive factors.  Plan Bipolar disorder type II- new diagnosis Patient completed mood disorder questionnaire and scored high on the same. Patient does report a remote history of borderline personality disorder diagnosis however not sure if she was officially diagnosed with it or not.  Patient advised to sign a release to obtain medical records [facility]. Start risperidone 0.25 mg p.o. nightly Provided medication education  Continue Trileptal 900 mg p.o. twice daily Celexa 20 mg p.o. daily  Panic attacks- stable Continue Celexa as prescribed  For tobacco use disorder-unstable Provided smoking cessation counseling   Rule out PTSD- we will continue to monitor her closely.  Patient will benefit from psychotherapy sessions given her history of trauma.  Will give her information about therapist in the community.  Will mail it to her.  Discussed with patient to get the following labs-TSH.  She will get it from her PMD  Also request to get an EKG to monitor her QTC.  Follow-up in clinic in 4 weeks or sooner if needed.  October 15 at 8:30 AM  I have spent atleast 60 MINUTES non face to face with patient today. More than 50 % of the time was spent for psychoeducation and supportive psychotherapy and care coordination. This note was generated in part or whole with voice recognition software. Voice recognition is usually quite accurate but there are transcription errors that can and very often do occur. I apologize for any typographical errors that were not detected and corrected.       Jomarie LongsSaramma Rashaad Hallstrom, MD 9/16/202012:43 PM

## 2018-10-15 NOTE — Patient Instructions (Addendum)
Risperidone tablets What is this medicine? RISPERIDONE (ris PER i done) is an antipsychotic. It is used to treat schizophrenia, bipolar disorder, and some symptoms of autism. This medicine may be used for other purposes; ask your health care provider or pharmacist if you have questions. COMMON BRAND NAME(S): Risperdal What should I tell my health care provider before I take this medicine? They need to know if you have any of these conditions:  dehydration  dementia  diabetes  difficulty swallowing  heart disease  history of breast cancer  history of stroke  irregular heartbeat  kidney disease  liver disease  low blood counts, like low white cell, platelet, or red cell counts  low blood pressure  Parkinson's disease  seizures  an unusual or allergic reaction to risperidone, paliperidone, other medicines, foods, dyes, or preservatives  pregnant or trying to get pregnant  breast-feeding How should I use this medicine? Take this medicine by mouth with a glass of water. Follow the directions on the prescription label. You can take it with or without food. If it upsets your stomach, take it with food. Take your medicine at regular intervals. Do not take it more often than directed. Do not stop taking except on your doctor's advice. Talk to your pediatrician regarding the use of this medicine in children. While this drug may be prescribed for children as young as 67 years of age for selected conditions, precautions do apply. Overdosage: If you think you have taken too much of this medicine contact a poison control center or emergency room at once. NOTE: This medicine is only for you. Do not share this medicine with others. What if I miss a dose? If you miss a dose, take it as soon as you can. If it is almost time for your next dose, take only that dose. Do not take double or extra doses. What may interact with this medicine? Do not take this medicine with any of the following  medications:  cisapride  dextromethorphan; quinidine  dronedarone  metoclopramide  pimozide  quinidine  thioridazine This medicine may also interact with the following medications:  alcohol  certain medicines for anxiety or sleep  certain medicines for blood pressure  certain medicines for fungal infections like fluconazole, itraconazole, ketoconazole, posaconazole. and voriconazole  certain medicines for seizures like carbamazepine, phenobarbital, and phenytoin  fluoxetine  levodopa  other medicines that prolong the QT interval (cause an abnormal heart rhythm)  paroxetine  rifampin This list may not describe all possible interactions. Give your health care provider a list of all the medicines, herbs, non-prescription drugs, or dietary supplements you use. Also tell them if you smoke, drink alcohol, or use illegal drugs. Some items may interact with your medicine. What should I watch for while using this medicine? Tell your doctor or healthcare professional if your symptoms do not start to get better or if they get worse. Visit your doctor or healthcare professional for regular checks on your progress. It may be several weeks before you see the full effects of this medicine. You may get dizzy or drowsy. Do not drive, use machinery, or do anything that needs mental alertness until you know how this medicine affects you. Do not stand or sit up quickly, especially if you are an older patient. This reduces the risk of dizzy or fainting spells. Alcohol can increase dizziness and drowsiness. Avoid alcoholic drinks. This medicine can reduce the response of your body to heat or cold. Dress warm in cold weather and stay  hydrated in hot weather. If possible, avoid extreme temperatures like saunas, hot tubs, very hot or cold showers, or activities that can cause dehydration such as vigorous exercise. This medicine may increase blood sugar. Ask your healthcare provider if changes in diet  or medicines are needed if you have diabetes. What side effects may I notice from receiving this medicine? Side effects that you should report to your doctor or health care professional as soon as possible:  allergic reactions like skin rash, itching or hives, swelling of the face, lips, or tongue  abnormal production of milk  breast enlargement in both males and females  breathing problems  changes in emotions or moods  difficulty moving, slow movements, tremor  fever or chills, sore throat  males: prolonged, painful erection  missed or irregular menstrual periods  muscle pain, spasms  problems with balance, talking, walking  redness, blistering, peeling, or loosening of the skin, including inside the mouth  restlessness, pacing, inability to keep still  seizures  signs and symptoms of a dangerous change in heartbeat or heart rhythm like chest pain; dizziness; fast or irregular heartbeat; palpitations; feeling faint or lightheaded, falls; breathing problems  signs and symptoms of high blood sugar such as being more thirsty or hungry or having to urinate more than normal. You may also feel very tired or have blurry vision.  signs and symptoms of low blood pressure like dizziness; feeling faint or lightheaded, falls; unusually weak or tired  signs and symptoms of neuroleptic malignant syndrome such as confusion; fast or irregular heart beat; high fever; increased sweating; stiff muscles  signs and symptoms of tardive dyskinesia such as uncontrollable head, neck, arm, or leg movements  sudden numbness or weakness of the face, arm, or leg  tremor  trouble swallowing Side effects that usually do not require medical attention (report to your doctor or health care professional if they continue or are bothersome):  constipation  dry mouth  drowsiness  tiredness  trouble sleeping  upset stomach  weight gain This list may not describe all possible side effects.  Call your doctor for medical advice about side effects. You may report side effects to FDA at 1-800-FDA-1088. Where should I keep my medicine? Keep out of the reach of children. Store at room temperature between 15 and 25 degrees C (59 and 77 degrees F). Protect from light. Throw away any unused medicine after the expiration date. NOTE: This sheet is a summary. It may not cover all possible information. If you have questions about this medicine, talk to your doctor, pharmacist, or health care provider.  2020 Elsevier/Gold Standard (2018-03-11 15:53:36)  Bipolar 2 Disorder Bipolar 2 disorder is a mental health disorder in which a person has episodes of emotional highs (mania) and lows (depression). Bipolar 2 is different from other bipolar disorders because the manic episodes are not as high and do not last as long. This is called hypomania. People with bipolar 2 disorder usually go back and forth between hypomanic and depressive episodes. What are the causes? The cause of this condition is not known. What increases the risk? The following factors may make you more likely to develop this condition:  Having a family member with the disorder.  An imbalance of certain chemicals in the brain (neurotransmitters).  Stress, such as a death, illness, or financial problems.  Certain conditions that affect the brain or spinal cord (neurologic conditions).  Brain injury (trauma).  Having another mental health disorder, such as: ? Obsessive compulsive disorder. ?  Schizophrenia. What are the signs or symptoms? Symptoms of hypomania include:  Very high self-esteem or self-confidence.  Decreased need for sleep.  Unusual talkativeness or feeling a need to keep talking. Speech may be very fast. It may seem like you cannot stop talking.  Racing thoughts or constant talking, with quick shifts between topics that may or may not be related (flight of ideas).  Decreased ability to focus or  concentrate.  Increased purposeful activity, such as work, studies, or social activity.  Increased nonproductive activity. This could be pacing, squirming and fidgeting, or finger and toe tapping.  Impulsive behavior and poor judgment. This may result in high-risk activities, such as having unprotected sex or spending a lot of money. Symptoms of depression include:  Feeling sad, hopeless, or helpless.  Frequent or uncontrollable crying.  Lack of feeling or caring about anything.  Sleeping too much.  Moving more slowly than usual.  Not being able to enjoy things you used to enjoy.  Desire to be alone all the time.  Feeling guilty or worthless.  Lack of energy or motivation.  Trouble concentrating or remembering.  Trouble making decisions.  Increased appetite.  Thoughts of death or desire to harm yourself. How is this diagnosed? To diagnose bipolar 2 disorder, your health care provider may ask about your:  Emotional episodes.  Medical history.  Alcohol and drug use. This includes prescription medicines. Certain medical conditions and substances can cause symptoms that seem like bipolar disorder (secondary bipolar disorder). How is this treated? Bipolar 2 disorder is a long-term (chronic) illness. It is best controlled with ongoing (continuous) treatment rather than being treated only when symptoms occur. Treatment may include:  Psychotherapy. Some forms of talk therapy, such as cognitive-behavioral therapy (CBT), can provide support, education, and guidance.  Coping strategies, such as journaling or relaxation exercises. Relaxation exercises include: ? Yoga. ? Meditation. ? Deep breathing.  Lifestyle changes, such as: ? Limiting alcohol and drug use. ? Exercising regularly. ? Getting plenty of sleep. ? Making healthy eating choices.  Medicine. Medicine can be prescribed by a health care provider who specializes in treating mental disorders  (psychiatrist). ? Medicines called mood stabilizers are usually prescribed. ? If symptoms occur even while taking a mood stabilizer, other medicines may be added. A combination of medicine, talk therapy, and coping methods is the best way to treat this condition. Follow these instructions at home: Activity  Return to your normal activities as told by your health care provider.  Find activities that you enjoy, and make time to do them.  Exercise regularly as told by your health care provider. Lifestyle  Limit alcohol intake to no more than 1 drink a day for nonpregnant women and 2 drinks a day for men. One drink equals 12 oz of beer, 5 oz of wine, or 1 oz of hard liquor.  Follow a set schedule for eating and sleeping.  Eat a balanced diet that includes fresh fruits and vegetables, whole grains, low-fat dairy, and lean meats.  Get at least 7-8 hours of sleep each night. General instructions  Take over-the-counter and prescription medicines only as told by your health care provider.  Think about joining a support group. Your health care provider may be able to recommend a support group.  Talk with your family and loved ones about your treatment goals and how they can help.  Keep all follow-up visits as told by your health care provider. This is important. Where to find more information For more information  about bipolar 2 disorder, visit the following websites:  [employer] on Mental Illness: www.nami.org  [employer] of Mental Health: http://www.maynard.net/ Contact a health care provider if:  Your symptoms get worse.  You have side effects from your medicine, and they get worse.  You have trouble sleeping.  You have trouble doing daily activities.  You feel unsafe in your surroundings.  You are dealing with substance abuse. Get help right away if:  You have new symptoms.  You have thoughts about harming yourself or others.  You harm  yourself. Summary  Bipolar 2 disorder is a mental health disorder in which a person has episodes of hypomania and depression.  Bipolar 2 is best treated through a combination of medicines, talk therapy, and coping strategies.  Talk with your family and loved ones about your treatment goals and how they can help. This information is not intended to replace advice given to you by your health care provider. Make sure you discuss any questions you have with your health care provider. Document Released: 02/21/2016 Document Revised: 12/28/2016 Document Reviewed: 02/21/2016 Elsevier Patient Education  2020 [employer].

## 2018-10-15 NOTE — Telephone Encounter (Signed)
I think the best day would be Thursday for me to do it. I will let her know.

## 2018-10-15 NOTE — Telephone Encounter (Signed)
Jill Sandoval, can we do an ECG for her?

## 2018-10-20 ENCOUNTER — Other Ambulatory Visit: Payer: Self-pay

## 2018-10-20 ENCOUNTER — Ambulatory Visit (INDEPENDENT_AMBULATORY_CARE_PROVIDER_SITE_OTHER): Payer: 59

## 2018-10-20 ENCOUNTER — Ambulatory Visit (INDEPENDENT_AMBULATORY_CARE_PROVIDER_SITE_OTHER): Payer: 59 | Admitting: Podiatry

## 2018-10-20 ENCOUNTER — Encounter: Payer: Self-pay | Admitting: Podiatry

## 2018-10-20 VITALS — BP 122/79 | HR 101

## 2018-10-20 DIAGNOSIS — M722 Plantar fascial fibromatosis: Secondary | ICD-10-CM

## 2018-10-20 MED ORDER — MELOXICAM 7.5 MG PO TABS: 7.5000 mg | ORAL_TABLET | Freq: Every day | ORAL | 0 refills | Status: DC

## 2018-10-20 NOTE — Progress Notes (Signed)
This patient presents the office with chief complaint of a painful rear foot area on the back of her right foot.  She says she has been previously diagnosed with tendinitis and has attempted to treat her pain with heel lifts and Advil.  She says the pain has been the same and she presents today for an evaluation and treatment.  She believes the pain initiated after she injured her right foot.  She says that the pain she has been experiencing has been present for approximately 6 weeks.  She says that the pain has been worsening and is especially painful after today's activity.  She presents the office today for an evaluation and treatment of her right foot.    Vascular  Dorsalis pedis and posterior tibial pulses are palpable  B/L.  Capillary return  WNL.  Temperature gradient is  WNL.  Skin turgor  WNL  Sensorium  Senn Weinstein monofilament wire  WNL. Normal tactile sensation.  Nail Exam  Patient has normal nails with no evidence of bacterial or fungal infection.  Orthopedic  Exam  Muscle tone and muscle strength  WNL.  No limitations of motion feet  B/L.  No crepitus or joint effusion noted.  Foot type is unremarkable and digits show no abnormalities.  Bony prominences are unremarkable.  Severe palpable pain noted at the insertion of the Achilles tendon right foot.  Pain noted in the right heel and through the arch of her right foot.  Patient does have limited range of motion of the first MPJ right foot.  Skin  No open lesions.  Normal skin texture and turgor.  Insertional achilles tendon right foot.  Plantar fasciitis right  IE.  X-rays taken reveal the initiation of outm at the insertion of the plantar fascia as well as the Achilles tendon right foot.  A met primus elevatus is noted on the lateral x-ray.  Discussed this condition with this patient.  Prescribed power step.insoles. as well has better foot gear.  Prescribed Mobic 7.5 mg #31 daily PC daily.  Patient to return to the office in 3 weeks  for further evaluation and treatment.   Gardiner Barefoot DPM

## 2018-10-23 ENCOUNTER — Ambulatory Visit: Payer: 59

## 2018-10-24 ENCOUNTER — Encounter: Payer: Self-pay | Admitting: Podiatry

## 2018-10-28 NOTE — Telephone Encounter (Signed)
Dr. Prudence Davidson, please advise on what else she can do

## 2018-10-30 ENCOUNTER — Ambulatory Visit (INDEPENDENT_AMBULATORY_CARE_PROVIDER_SITE_OTHER): Payer: 59 | Admitting: Podiatry

## 2018-10-30 ENCOUNTER — Encounter: Payer: Self-pay | Admitting: Podiatry

## 2018-10-30 ENCOUNTER — Other Ambulatory Visit: Payer: Self-pay

## 2018-10-30 DIAGNOSIS — M7661 Achilles tendinitis, right leg: Secondary | ICD-10-CM

## 2018-10-30 DIAGNOSIS — M722 Plantar fascial fibromatosis: Secondary | ICD-10-CM

## 2018-10-30 MED ORDER — METHYLPREDNISOLONE 4 MG PO TBPK: ORAL_TABLET | ORAL | 0 refills | Status: DC

## 2018-10-30 NOTE — Progress Notes (Signed)
This patient returns to the office with continued pain noted in her right foot.  She was previously diagnosed with Achilles tendinitis right foot and plantar fasciitis right foot.  She was treated with Mobic 7.5 mg and power step insoles.  She says that the insoles in the medicine have not helped with her pain.  She called to the office earlier this week and was told to present to the office today in [place] for continued evaluation and treatment.  Patient has continued pain noted through the arch of the right foot as well at the insertion of the plantar fascia right foot.  Pain is also noted at the insertion of the Achilles tendon right foot.  She presents the office today for continued evaluation and treatment of her right foot  Vascular  Dorsalis pedis and posterior tibial pulses are palpable  B/L.  Capillary return  WNL.  Temperature gradient is  WNL.  Skin turgor  WNL  Sensorium  Senn Weinstein monofilament wire  WNL. Normal tactile sensation.  Nail Exam  Patient has normal nails with no evidence of bacterial or fungal infection.  Orthopedic  Exam  Muscle tone and muscle strength  WNL.  No limitations of motion feet  B/L.  No crepitus or joint effusion noted.  Foot type is unremarkable and digits show no abnormalities.  Bony prominences are unremarkable.  Continued pain noted in her right heel and through the arch of her right foot upon palpation.  Pain noted at the insertion of the Achilles tendon right foot.  No evidence of any increased pain or swelling noted.  Skin  No open lesions.  Normal skin texture and turgor.  Insertional Achilles tendinitis right foot  Plantar fasciitis right foot.  ROV.  Examination of her right foot was performed.  Discussed this condition this condition with this patient.  Patient states that she does not want an injection in her foot.  Therefore we discussed dispensing a cam walker for her help to immobilize her right foot.  Prescribed a Medrol Dosepak for this  patient.  Told her to disc continue the Mobic and start the Mobic after she finishes the Medrol Dosepak.  Return to the clinic 2 weeks.  Gardiner Barefoot DPM

## 2018-11-04 ENCOUNTER — Telehealth: Payer: Self-pay

## 2018-11-04 DIAGNOSIS — F410 Panic disorder [episodic paroxysmal anxiety] without agoraphobia: Secondary | ICD-10-CM

## 2018-11-04 DIAGNOSIS — F3181 Bipolar II disorder: Secondary | ICD-10-CM

## 2018-11-04 MED ORDER — OXCARBAZEPINE 300 MG PO TABS: ORAL_TABLET | ORAL | 1 refills | Status: DC

## 2018-11-04 NOTE — Telephone Encounter (Signed)
Pt called left message that she needs a refill on her medication.    Oxcarbazepine (TRILEPTAL) 300 MG tablet Medication Date: 10/07/2018 Department: [facility] Ordering/Authorizing: Delight Hoh, MD  Order Providers  Prescribing Provider Encounter Provider  Delight Hoh, MD Scroggins, Nedra Hai, LPN  Outpatient Medication Detail   Disp Refills Start End   Oxcarbazepine (TRILEPTAL) 300 MG tablet 180 tablet 0 10/07/2018    Sig: TAKE 3 TABLETS BY MOUTH EVERY MORNING AND 3 TABLETS BY MOUTH AT BEDTIME   Sent to pharmacy as: Oxcarbazepine (TRILEPTAL) 300 MG tablet   E-Prescribing Status: Receipt confirmed by pharmacy (10/07/2018 2:07 PM EDT)   Pharmacy  [place], [place]

## 2018-11-04 NOTE — Telephone Encounter (Signed)
Sent trileptal to pharmacy

## 2018-11-10 ENCOUNTER — Ambulatory Visit: Payer: 59 | Admitting: Podiatry

## 2018-11-10 ENCOUNTER — Other Ambulatory Visit (INDEPENDENT_AMBULATORY_CARE_PROVIDER_SITE_OTHER): Payer: 59

## 2018-11-10 DIAGNOSIS — N926 Irregular menstruation, unspecified: Secondary | ICD-10-CM

## 2018-11-10 LAB — POCT URINE PREGNANCY: Preg Test, Ur: NEGATIVE

## 2018-11-10 NOTE — Telephone Encounter (Signed)
Jill Sandoval, see messages below.  Can we arrange a urine pregnancy test for missed menstrual cycle?

## 2018-11-13 ENCOUNTER — Ambulatory Visit (INDEPENDENT_AMBULATORY_CARE_PROVIDER_SITE_OTHER): Payer: 59 | Admitting: Podiatry

## 2018-11-13 ENCOUNTER — Other Ambulatory Visit: Payer: Self-pay

## 2018-11-13 ENCOUNTER — Encounter: Payer: Self-pay | Admitting: Podiatry

## 2018-11-13 ENCOUNTER — Ambulatory Visit (INDEPENDENT_AMBULATORY_CARE_PROVIDER_SITE_OTHER): Payer: 59 | Admitting: Psychiatry

## 2018-11-13 ENCOUNTER — Encounter: Payer: Self-pay | Admitting: Psychiatry

## 2018-11-13 DIAGNOSIS — M7661 Achilles tendinitis, right leg: Secondary | ICD-10-CM

## 2018-11-13 DIAGNOSIS — F410 Panic disorder [episodic paroxysmal anxiety] without agoraphobia: Secondary | ICD-10-CM | POA: Diagnosis not present

## 2018-11-13 DIAGNOSIS — F17200 Nicotine dependence, unspecified, uncomplicated: Secondary | ICD-10-CM

## 2018-11-13 DIAGNOSIS — F3181 Bipolar II disorder: Secondary | ICD-10-CM

## 2018-11-13 DIAGNOSIS — M722 Plantar fascial fibromatosis: Secondary | ICD-10-CM

## 2018-11-13 MED ORDER — RISPERIDONE 0.5 MG PO TABS: 0.5000 mg | ORAL_TABLET | Freq: Every day | ORAL | 2 refills | Status: DC

## 2018-11-13 NOTE — Progress Notes (Signed)
Virtual Visit via Video Note  I connected with Jill Sandoval on 11/13/18 at  8:30 AM EDT by a video enabled telemedicine application and verified that I am speaking with the correct person using two identifiers.   I discussed the limitations of evaluation and management by telemedicine and the availability of in person appointments. The patient expressed understanding and agreed to proceed.   I discussed the assessment and treatment plan with the patient. The patient was provided an opportunity to ask questions and all were answered. The patient agreed with the plan and demonstrated an understanding of the instructions.   The patient was advised to call back or seek an in-person evaluation if the symptoms worsen or if the condition fails to improve as anticipated.  [place] MD OP Progress Note  11/13/2018 10:29 AM Jill Sandoval  MRN:  [id number]  Chief Complaint:  Chief Complaint    Follow-up     HPI: Creasie is a 28 year old Caucasian female, married, employed, currently lives in [place], has a history of bipolar disorder type II, asthma, tobacco use disorder, panic attacks was evaluated by telemedicine today.  Patient today reports she has noticed improvement in her mood symptoms.  She however continues to have some mood swings on and off.  She does not feel as depressed as she used to before.  She however does have  irritability occasionally.  She believes the risperidone as beneficial.  She denies any side effects.  She reports sleep is improved.  She denies any significant panic attacks at this time.  Patient reports she has established care with a new therapist and plans to start psychotherapy sessions soon.  She however does not remember the name of the therapist and will update writer once she can get the name.  Patient denies any suicidality, homicidality or perceptual disturbances.  Patient reports work is going well.  She reports she has been cutting back on smoking  cigarettes.  She denies any other concerns today.  Visit Diagnosis: r/o PTSD   ICD-10-CM   1. Bipolar 2 disorder (HCC)  F31.81 risperiDONE (RISPERDAL) 0.5 MG tablet   MIXED, MILD  2. Panic attacks  F41.0   3. Tobacco use disorder  F17.200     Past Psychiatric History: I have reviewed past psychiatric history from my progress note on 10/15/2018.  Past trials of Celexa, Trileptal.  Past Medical History:  Past Medical History:  Diagnosis Date  . Anxiety and depression   . Asthma    inhaler prn  . Fetal demise > 22 weeks, delivered, current hospitalization 11/04/2015  . Miscarriage     Past Surgical History:  Procedure Laterality Date  . CESAREAN SECTION N/A 03/24/2012   Procedure: CESAREAN SECTION;  Surgeon: Sharene Butters, MD;  Location: [facility];  Service: Obstetrics;  Laterality: N/A;  . CESAREAN SECTION N/A 09/24/2016   Procedure: CESAREAN SECTION;  Surgeon: Vanessa Kick, MD;  Location: [facility];  Service: Obstetrics;  Laterality: N/A;    Family Psychiatric History: I have reviewed family psychiatric history from a progress note on 10/15/2018.  Family History:  Family History  Problem Relation Age of Onset  . Hypertension Maternal Grandmother   . Prostate cancer Maternal Grandfather   . Hearing loss Brother   . Hearing loss Paternal Grandfather   . Hypertension Father   . Other Neg Hx     Social History: Reviewed social history from my progress note on 10/15/2018. Social History   Socioeconomic History  . Marital status:  Married    Spouse name: Not on file  . Number of children: 2  . Years of education: Not on file  . Highest education level: Not on file  Occupational History  . Not on file  Social Needs  . Financial resource strain: Not on file  . Food insecurity    Worry: Not on file    Inability: Not on file  . Transportation needs    Medical: Not on file    Non-medical: Not on file  Tobacco Use  . Smoking status: Current Every Day Smoker     Packs/day: 0.25    Types: Cigarettes  . Smokeless tobacco: Never Used  Substance and Sexual Activity  . Alcohol use: No  . Drug use: No  . Sexual activity: Yes  Lifestyle  . Physical activity    Days per week: Not on file    Minutes per session: Not on file  . Stress: Not on file  Relationships  . Social Musician on phone: Not on file    Gets together: Not on file    Attends religious service: Not on file    Active member of club or organization: Not on file    Attends meetings of clubs or organizations: Not on file    Relationship status: Not on file  Other Topics Concern  . Not on file  Social History Narrative   Married.   2 children.   Works at [employer].   Enjoys camping, being outdoors.     Allergies: No Known Allergies  Metabolic Disorder Labs: Lab Results  Component Value Date   HGBA1C 5.0 06/30/2015   No results found for: PROLACTIN Lab Results  Component Value Date   CHOL 211 (H) 07/16/2018   TRIG 89.0 07/16/2018   HDL 47.90 07/16/2018   CHOLHDL 4 07/16/2018   VLDL 17.8 07/16/2018   LDLCALC 145 (H) 07/16/2018   LDLCALC 103 (H) 06/30/2015   Lab Results  Component Value Date   TSH 1.18 10/15/2018   TSH 1.270 06/30/2015    Therapeutic Level Labs: No results found for: LITHIUM No results found for: VALPROATE No components found for:  CBMZ  Current Medications: Current Outpatient Medications  Medication Sig Dispense Refill  . citalopram (CELEXA) 20 MG tablet TAKE 1 TABLET BY MOUTH DAILY 30 tablet 2  . Oxcarbazepine (TRILEPTAL) 300 MG tablet TAKE 3 TABLETS BY MOUTH EVERY MORNING AND 3 TABLETS BY MOUTH AT BEDTIME 180 tablet 1  . risperiDONE (RISPERDAL) 0.5 MG tablet Take 1 tablet (0.5 mg total) by mouth at bedtime. 30 tablet 2  . methylPREDNISolone (MEDROL DOSEPAK) 4 MG TBPK tablet Take as directed 21 tablet 0   No current facility-administered medications for this visit.      Musculoskeletal: Strength & Muscle Tone: UTA Gait &  Station: normal Patient leans: N/A  Psychiatric Specialty Exam: Review of Systems  Psychiatric/Behavioral: Positive for depression. The patient is nervous/anxious.   All other systems reviewed and are negative.   There were no vitals taken for this visit.There is no height or weight on file to calculate BMI.  General Appearance: Casual  Eye Contact:  Fair  Speech:  Normal Rate  Volume:  Normal  Mood:  Anxious and Depressed Improving  Affect:  Congruent  Thought Process:  Goal Directed and Descriptions of Associations: Intact  Orientation:  Full (Time, Place, and Person)  Thought Content: Logical   Suicidal Thoughts:  No  Homicidal Thoughts:  No  Memory:  Immediate;  Fair Recent;   Fair Remote;   Fair  Judgement:  Fair  Insight:  Fair  Psychomotor Activity:  Normal  Concentration:  Concentration: Fair and Attention Span: Fair  Recall:  FiservFair  Fund of Knowledge: Fair  Language: Fair  Akathisia:  No  Handed:  Right  AIMS (if indicated): denies tremors, rigidity  Assets:  Communication Skills Desire for Improvement Housing Social Support  ADL's:  Intact  Cognition: WNL  Sleep:  Fair   Screenings: GAD-7     [facility]  Total GAD-7 Score  8    PHQ2-9     [facility]  PHQ-2 Total Score  3  PHQ-9 Total Score  15       Assessment and Plan: Toni AmendCourtney is a 28 year old Caucasian female, married, employed, lives in [place], has a history of mood lability was evaluated by telemedicine today.  Patient is biologically predisposed given her history of trauma and family history.  She also has psychosocial stressors of the current pandemic, work and relationship struggles.  Patient will benefit from continued medication readjustment although her mood symptoms are improving.  Plan as noted below.  Plan Bipolar disorder type II-improving Increase risperidone to 0.5 mg p.o.  nightly Trileptal 900 mg p.o. twice daily. Celexa 20 mg p.o. daily  Panic attacks-stable Celexa as prescribed  Tobacco use disorder-improving She is cutting back.  Provided smoking cessation counseling.  Pending EKG for qtc monitoring.  Reviewed and discussed TSH-dated 10/15/2018-1.18-within normal limits.  Follow-up in clinic in 1 month or sooner if needed.  Nov 16 at 4:20 p.m.  I have spent atleast 15 minutes non face to face with patient today. More than 50 % of the time was spent for psychoeducation and supportive psychotherapy and care coordination. This note was generated in part or whole with voice recognition software. Voice recognition is usually quite accurate but there are transcription errors that can and very often do occur. I apologize for any typographical errors that were not detected and corrected.       Jomarie LongsSaramma Alexsia Klindt, MD 11/13/2018, 10:29 AM

## 2018-11-13 NOTE — Progress Notes (Signed)
This patient presents to the office for continued evaluation and treatment of Achilles tendinitis right foot and plantar fasciitis right foot.  She presents the office today stating that her foot is much better and it feels like a new foot.  She initially was treated with Mobic and a Medrol Dosepak and note power step insole but the problem was severe.  2 weeks ago we dispensed a Cam walker for immobilization of her right foot.  She is very pleased with her treatment and is very pleased having minimal pain and discomfort.  She says she has already tried her regular shoes and she is having minimal pain.  She presents the office today for continued evaluation and treatment.  Vascular  Dorsalis pedis and posterior tibial pulses are palpable  B/L.  Capillary return  WNL.  Temperature gradient is  WNL.  Skin turgor  WNL  Sensorium  Senn Weinstein monofilament wire  WNL. Normal tactile sensation.  Nail Exam  Patient has normal nails with no evidence of bacterial or fungal infection.  Orthopedic  Exam  Muscle tone and muscle strength  WNL.  No limitations of motion feet  B/L.  No crepitus or joint effusion noted.  Foot type is unremarkable and digits show no abnormalities.  Bony prominences are unremarkable.No palpable pain at insertion plantar fascia and at the insertion achilles tendon right fo  Skin  No open lesions.  Normal skin texture and turgor  Achilles Tendinitis Right  Plantar Fasciitis right foot.  ROV.Marland Kitchen Discussed this condition with this patient.  Patient is very pleased and feels like she has a new foot since she is now pain-free.  She was told to keep the cam walker at home and use as needed.  Also told this patient that she would benefit from always wearing the power step insole to help to prevent a reoccurrence of this condition.  Patient to return to the office as needed.   Gardiner Barefoot DPM

## 2018-12-09 ENCOUNTER — Encounter: Payer: Self-pay | Admitting: Primary Care

## 2018-12-09 ENCOUNTER — Other Ambulatory Visit: Payer: Self-pay

## 2018-12-09 ENCOUNTER — Ambulatory Visit (INDEPENDENT_AMBULATORY_CARE_PROVIDER_SITE_OTHER): Payer: 59 | Admitting: Primary Care

## 2018-12-09 DIAGNOSIS — L03311 Cellulitis of abdominal wall: Secondary | ICD-10-CM

## 2018-12-09 DIAGNOSIS — L0390 Cellulitis, unspecified: Secondary | ICD-10-CM | POA: Insufficient documentation

## 2018-12-09 MED ORDER — CEPHALEXIN 500 MG PO CAPS: 500.0000 mg | ORAL_CAPSULE | Freq: Three times a day (TID) | ORAL | 0 refills | Status: AC

## 2018-12-09 NOTE — Progress Notes (Signed)
Subjective:    Patient ID: Jill Sandoval, female    DOB: [birth year], 28 y.o.   MRN: [id number]  HPI  Jill Sandoval is a 28 year old female with a history of achilles tendinitis, bipolar disorder who presents today with a chief complaint of skin irritation.   The irritation is located to the right mid abdomen for which she noticed initially thee days ago. She did not see an insect but describes the site as though she was bitten by an insect. She applied peroxide to the site at the time, since then has noticed gradual increase in size with erythema, burning, and pain to the site. Yesterday she felt "like I was burning up" but checked her temperature and it was normal. She's since applied triple antibiotic ointment without improvement. The site is growing more red and painful.   She denies fevers, itching, drainage. No one else in her family has these symptoms.  Review of Systems  Constitutional: Negative for fever.  Skin: Positive for color change and wound.       Past Medical History:  Diagnosis Date  . Anxiety and depression   . Asthma    inhaler prn  . Fetal demise > 22 weeks, delivered, current hospitalization 11/04/2015  . Miscarriage      Social History   Socioeconomic History  . Marital status: Married    Spouse name: Not on file  . Number of children: 2  . Years of education: Not on file  . Highest education level: Not on file  Occupational History  . Not on file  Social Needs  . Financial resource strain: Not on file  . Food insecurity    Worry: Not on file    Inability: Not on file  . Transportation needs    Medical: Not on file    Non-medical: Not on file  Tobacco Use  . Smoking status: Current Every Day Smoker    Packs/day: 0.25    Types: Cigarettes  . Smokeless tobacco: Never Used  Substance and Sexual Activity  . Alcohol use: No  . Drug use: No  . Sexual activity: Yes  Lifestyle  . Physical activity    Days per week: Not on file    Minutes per  session: Not on file  . Stress: Not on file  Relationships  . Social Musician on phone: Not on file    Gets together: Not on file    Attends religious service: Not on file    Active member of club or organization: Not on file    Attends meetings of clubs or organizations: Not on file    Relationship status: Not on file  . Intimate partner violence    Fear of current or ex partner: Not on file    Emotionally abused: Not on file    Physically abused: Not on file    Forced sexual activity: Not on file  Other Topics Concern  . Not on file  Social History Narrative   Married.   2 children.   Works at [employer].   Enjoys camping, being outdoors.     Past Surgical History:  Procedure Laterality Date  . CESAREAN SECTION N/A 03/24/2012   Procedure: CESAREAN SECTION;  Surgeon: Mickel Baas, MD;  Location: [facility];  Service: Obstetrics;  Laterality: N/A;  . CESAREAN SECTION N/A 09/24/2016   Procedure: CESAREAN SECTION;  Surgeon: Waynard Reeds, MD;  [facility];  Service: Obstetrics;  Laterality: N/A;  Family History  Problem Relation Age of Onset  . Hypertension Maternal Grandmother   . Prostate cancer Maternal Grandfather   . Hearing loss Brother   . Hearing loss Paternal Grandfather   . Hypertension Father   . Other Neg Hx     No Known Allergies  Current Outpatient Medications on File Prior to Visit  Medication Sig Dispense Refill  . citalopram (CELEXA) 20 MG tablet TAKE 1 TABLET BY MOUTH DAILY 30 tablet 2  . Oxcarbazepine (TRILEPTAL) 300 MG tablet TAKE 3 TABLETS BY MOUTH EVERY MORNING AND 3 TABLETS BY MOUTH AT BEDTIME 180 tablet 1  . risperiDONE (RISPERDAL) 0.5 MG tablet Take 1 tablet (0.5 mg total) by mouth at bedtime. 30 tablet 2   No current facility-administered medications on file prior to visit.     BP 120/72   Pulse 75   Temp 97.8 F (36.6 C) (Temporal)   Ht 5\' 4"  (1.626 m)   Wt 245 lb 3 oz (111.2 kg)   LMP 11/09/2018   SpO2 98%    BMI 42.09 kg/m    Objective:   Physical Exam  Constitutional: She appears well-nourished.  GI: Soft.  Skin: Skin is warm and dry. There is erythema.  4-5 cm area of erythema with warmth to right mid abdomen with small raised bump in center. Tender. No drainage.            Assessment & Plan:

## 2018-12-09 NOTE — Patient Instructions (Signed)
Start Cephalexin antibiotics for the infection. Take 1 capsule by mouth three times daily for 5 days.  Please update me Friday this week as discussed.  It was a pleasure to see you today!

## 2018-12-09 NOTE — Assessment & Plan Note (Signed)
Acute and early appearing cellulitis, likely from insect bite. No improvement with topical and OTC treatment. Given presentation will treat with cephalexin course.  Rx for cephalexin 500 mg sent to pharmacy. She will update in three days. Stable for outpatient treatment.

## 2018-12-15 ENCOUNTER — Ambulatory Visit: Payer: 59 | Admitting: Psychiatry

## 2018-12-24 ENCOUNTER — Other Ambulatory Visit: Payer: Self-pay

## 2018-12-24 ENCOUNTER — Ambulatory Visit (INDEPENDENT_AMBULATORY_CARE_PROVIDER_SITE_OTHER): Payer: Self-pay | Admitting: Psychiatry

## 2018-12-24 DIAGNOSIS — Z91199 Patient's noncompliance with other medical treatment and regimen due to unspecified reason: Secondary | ICD-10-CM | POA: Insufficient documentation

## 2018-12-24 DIAGNOSIS — Z5329 Procedure and treatment not carried out because of patient's decision for other reasons: Secondary | ICD-10-CM | POA: Insufficient documentation

## 2018-12-24 NOTE — Progress Notes (Signed)
No response to call or text. 

## 2018-12-30 ENCOUNTER — Other Ambulatory Visit: Payer: Self-pay | Admitting: Primary Care

## 2018-12-30 DIAGNOSIS — F329 Major depressive disorder, single episode, unspecified: Secondary | ICD-10-CM

## 2018-12-30 DIAGNOSIS — F32A Depression, unspecified: Secondary | ICD-10-CM

## 2018-12-30 NOTE — Telephone Encounter (Signed)
Last prescribed on 09/08/2018 . Last appointment on 12/09/2018. No future appointment

## 2018-12-30 NOTE — Telephone Encounter (Signed)
Will forward to patient's psychiatrist.

## 2019-01-14 ENCOUNTER — Ambulatory Visit (INDEPENDENT_AMBULATORY_CARE_PROVIDER_SITE_OTHER): Payer: 59 | Admitting: Psychiatry

## 2019-01-14 ENCOUNTER — Other Ambulatory Visit: Payer: Self-pay

## 2019-01-14 ENCOUNTER — Encounter: Payer: Self-pay | Admitting: Psychiatry

## 2019-01-14 DIAGNOSIS — F17200 Nicotine dependence, unspecified, uncomplicated: Secondary | ICD-10-CM

## 2019-01-14 DIAGNOSIS — F410 Panic disorder [episodic paroxysmal anxiety] without agoraphobia: Secondary | ICD-10-CM

## 2019-01-14 DIAGNOSIS — F3177 Bipolar disorder, in partial remission, most recent episode mixed: Secondary | ICD-10-CM

## 2019-01-14 MED ORDER — OXCARBAZEPINE 300 MG PO TABS: ORAL_TABLET | ORAL | 2 refills | Status: DC

## 2019-01-14 MED ORDER — RISPERIDONE 0.5 MG PO TABS: 0.5000 mg | ORAL_TABLET | Freq: Every day | ORAL | 2 refills | Status: DC

## 2019-01-14 NOTE — Progress Notes (Signed)
Virtual Visit via Video Note  I connected with Jill Sandoval on 01/14/19 at  4:45 PM EST by a video enabled telemedicine application and verified that I am speaking with the correct person using two identifiers.   I discussed the limitations of evaluation and management by telemedicine and the availability of in person appointments. The patient expressed understanding and agreed to proceed.   I discussed the assessment and treatment plan with the patient. The patient was provided an opportunity to ask questions and all were answered. The patient agreed with the plan and demonstrated an understanding of the instructions.   The patient was advised to call back or seek an in-person evaluation if the symptoms worsen or if the condition fails to improve as anticipated.   BH MD OP Progress Note  01/14/2019 4:39 PM Jill Sandoval  MRN:  [id number]  Chief Complaint:  Chief Complaint    Follow-up     HPI: Jill Sandoval is a 28 year old Caucasian female, married, employed, [place] in [place], has a history of bipolar disorder type II, asthma, tobacco use disorder, panic attacks was evaluated by telemedicine today.  A video call was initiated however due to connection problem it had to be changed to a phone call.  Patient today reports she missed her last appointment since she was on vacation.  She today reports she is currently doing well on the current medication regimen.  Her mood symptoms are stable on the risperidone and oxcarbazepine.  She denies any irritability at this time.  She denies any side effects to her medications.  She reports sleep is good.  Patient denies any suicidality, homicidality or perceptual disturbances.  Patient reports she stopped smoking cigarettes 3 months ago.  Patient reports she has not been able to establish care with a therapist since work has been very busy.  Patient however agrees to call her previous provider office to restart psychotherapy  sessions again.  Patient denies any other concerns today. Visit Diagnosis: R/O PTSD   ICD-10-CM   1. Bipolar disorder, in partial remission, most recent episode mixed (HCC)  F31.77 Oxcarbazepine (TRILEPTAL) 300 MG tablet  2. Panic attacks  F41.0 risperiDONE (RISPERDAL) 0.5 MG tablet  3. Tobacco use disorder  F17.200    in remission    Past Psychiatric History: Reviewed past psychiatric history from my progress note on 10/15/2018.  Past trials of Celexa, Trileptal.  Past Medical History:  Past Medical History:  Diagnosis Date  . Anxiety and depression   . Asthma    inhaler prn  . Fetal demise > 22 weeks, delivered, current hospitalization 11/04/2015  . Miscarriage     Past Surgical History:  Procedure Laterality Date  . CESAREAN SECTION N/A 03/24/2012   Procedure: CESAREAN SECTION;  Surgeon: Mickel Baasichard D Kaplan, MD;  Location: [facility];  Service: Obstetrics;  Laterality: N/A;  . CESAREAN SECTION N/A 09/24/2016   Procedure: CESAREAN SECTION;  Surgeon: Waynard Reedsoss, [name], MD;  [facility];  Service: Obstetrics;  Laterality: N/A;    Family Psychiatric History: Reviewed family psychiatric history from my progress note on 10/15/2018.  Family History:  Family History  Problem Relation Age of Onset  . Hypertension Maternal Grandmother   . Prostate cancer Maternal Grandfather   . Hearing loss Brother   . Hearing loss Paternal Grandfather   . Hypertension Father   . Other Neg Hx     Social History: Reviewed social history from my progress note on 10/15/2018. Social History   Socioeconomic History  .  Marital status: Married    Spouse name: Not on file  . Number of children: 2  . Years of education: Not on file  . Highest education level: Not on file  Occupational History  . Not on file  Tobacco Use  . Smoking status: Former Smoker    Packs/day: 0.25    Types: Cigarettes    Quit date: 11/14/2018    Years since quitting: 0.1  . Smokeless tobacco: Never Used  Substance  and Sexual Activity  . Alcohol use: No  . Drug use: No  . Sexual activity: Yes  Other Topics Concern  . Not on file  Social History Narrative   Married.   2 children.   Works at [employer].   Enjoys camping, being outdoors.    Social Determinants of Health   Financial Resource Strain:   . Difficulty of Paying Living Expenses: Not on file  Food Insecurity:   . Worried About Programme researcher, broadcasting/film/video in the Last Year: Not on file  . Ran Out of Food in the Last Year: Not on file  Transportation Needs:   . Lack of Transportation (Medical): Not on file  . Lack of Transportation (Non-Medical): Not on file  Physical Activity:   . Days of Exercise per Week: Not on file  . Minutes of Exercise per Session: Not on file  Stress:   . Feeling of Stress : Not on file  Social Connections:   . Frequency of Communication with Friends and Family: Not on file  . Frequency of Social Gatherings with Friends and Family: Not on file  . Attends Religious Services: Not on file  . Active Member of Clubs or Organizations: Not on file  . Attends Banker Meetings: Not on file  . Marital Status: Not on file    Allergies: No Known Allergies  Metabolic Disorder Labs: Lab Results  Component Value Date   HGBA1C 5.0 06/30/2015   No results found for: PROLACTIN Lab Results  Component Value Date   CHOL 211 (H) 07/16/2018   TRIG 89.0 07/16/2018   HDL 47.90 07/16/2018   CHOLHDL 4 07/16/2018   VLDL 17.8 07/16/2018   LDLCALC 145 (H) 07/16/2018   LDLCALC 103 (H) 06/30/2015   Lab Results  Component Value Date   TSH 1.18 10/15/2018   TSH 1.270 06/30/2015    Therapeutic Level Labs: No results found for: LITHIUM No results found for: VALPROATE No components found for:  CBMZ  Current Medications: Current Outpatient Medications  Medication Sig Dispense Refill  . citalopram (CELEXA) 20 MG tablet TAKE 1 TABLET BY MOUTH DAILY 30 tablet 2  . Oxcarbazepine (TRILEPTAL) 300 MG tablet TAKE 3 TABLETS  BY MOUTH EVERY MORNING AND 3 TABLETS BY MOUTH AT BEDTIME 180 tablet 2  . risperiDONE (RISPERDAL) 0.5 MG tablet Take 1 tablet (0.5 mg total) by mouth at bedtime. 30 tablet 2   No current facility-administered medications for this visit.     Musculoskeletal: Strength & Muscle Tone: UTA Gait & Station: Reports as WNL Patient leans: N/A  Psychiatric Specialty Exam: Review of Systems  Psychiatric/Behavioral: Negative for agitation, behavioral problems, confusion, decreased concentration, dysphoric mood, hallucinations, self-injury, sleep disturbance and suicidal ideas. The patient is not nervous/anxious and is not hyperactive.   All other systems reviewed and are negative.   There were no vitals taken for this visit.There is no height or weight on file to calculate BMI.  General Appearance: UTA  Eye Contact:  UTA  Speech:  Clear and  Coherent  Volume:  Normal  Mood:  Euthymic  Affect:  UTA  Thought Process:  Goal Directed and Descriptions of Associations: Intact  Orientation:  Full (Time, Place, and Person)  Thought Content: Logical   Suicidal Thoughts:  No  Homicidal Thoughts:  No  Memory:  Immediate;   Fair Recent;   Fair Remote;   Fair  Judgement:  Fair  Insight:  Fair  Psychomotor Activity:  UTA  Concentration:  Concentration: Fair and Attention Span: Fair  Recall:  [employer] of Knowledge: Fair  Language: Fair  Akathisia:  No  Handed:  Right  AIMS (if indicated): Denies rigidity, stiffness  Assets:  Communication Skills Desire for Improvement Social Support  ADL's:  Intact  Cognition: WNL  Sleep:  Fair   Screenings: GAD-7     Office Visit from 07/08/2018 in [place] at [facility]  Total GAD-7 Score  8    PHQ2-9     Office Visit from 07/08/2018 in [place] at [facility]  PHQ-2 Total Score  3  PHQ-9 Total Score  15       Assessment and Plan: Jill Sandoval is a 28 year old Caucasian female, married, employed, lives in [place], has a history of  bipolar disorder, panic attacks, tobacco use disorder was evaluated by telemedicine today.  Patient is biologically predisposed given her history of trauma and family history.  Patient with psychosocial stressors of the current pandemic and relationship struggles.  Patient however is currently stable on medications and will continue to benefit from psychotherapy sessions.  Plan as noted below.  Plan Bipolar disorder type II in partial remission Risperidone 0.5 mg p.o. nightly. Trileptal 900 mg p.o. twice daily. Celexa 20 mg p.o. daily.  Panic attacks-stable Celexa as prescribed  Tobacco use disorder-in remission Patient reports she stopped smoking 3 months ago.  Pending EKG to monitor QTC.  Patient advised to establish care with therapist.  Follow-up in clinic in 6 to 8 weeks or sooner if needed.  February 2 at 4:40 PM  I have spent atleast 15 minutes non face to face with patient today. More than 50 % of the time was spent for psychoeducation and supportive psychotherapy and care coordination. This note was generated in part or whole with voice recognition software. Voice recognition is usually quite accurate but there are transcription errors that can and very often do occur. I apologize for any typographical errors that were not detected and corrected.       Ursula Alert, MD 01/14/2019, 4:39 PM

## 2019-01-15 ENCOUNTER — Telehealth: Payer: Self-pay | Admitting: Psychiatry

## 2019-01-15 NOTE — Telephone Encounter (Signed)
I have reviewed the following medical records received from [facility] psychiatry, [place]-office visits dated 02/06/2018, 02/18/2018,.  Authorizing provider-PA Lissa Hoard - [place]  HPI- Increased aggressiveness past 6 weeks.  Depressed since the past 1 month.  Diagnosis-episodic mood disorder, borderline personality disorder.  Patient on medications like Trileptal 300 mg 2 tablets by mouth twice a day. Trileptal increased at that visit to 900 mg twice a day.

## 2019-02-03 ENCOUNTER — Telehealth: Payer: Self-pay

## 2019-02-03 NOTE — Telephone Encounter (Signed)
Patient would like to transfer care from [place] to [place]. Is this ok? Please advise.

## 2019-02-03 NOTE — Telephone Encounter (Signed)
Fine with me, thanks. 

## 2019-02-04 NOTE — Telephone Encounter (Signed)
Fine with me

## 2019-02-18 ENCOUNTER — Telehealth: Payer: Self-pay

## 2019-02-18 NOTE — Telephone Encounter (Signed)
Jill Sandoval came to my office yesterday reporting she was having some dizziness, feeling as though blood pressure was elevated, and feeling her heart beat in her chest. This was approximately 1.5 hours after getting the covid vaccine. She was concerned she was having side effects from the vaccine.   She also reported she had a HA but took some advil and went home last night and relaxed and was feeling much better today.  Vital signs for yesterday and today are below.   She wants to know if she is ok to get the second vaccine in 3 wks and if you recommend a certain time of day or if you have any other advise.  Yesterday BP: 118/74   HR 124-103  Today BP 116/68 HR 93

## 2019-02-18 NOTE — Telephone Encounter (Signed)
Discussed with patient in person in the office. Since she's doing better today we will have her proceed with her second vaccine.  She will monitor HR and update if anything changes.

## 2019-02-19 ENCOUNTER — Ambulatory Visit (INDEPENDENT_AMBULATORY_CARE_PROVIDER_SITE_OTHER): Payer: 59 | Admitting: Internal Medicine

## 2019-02-19 ENCOUNTER — Other Ambulatory Visit: Payer: Self-pay

## 2019-02-19 ENCOUNTER — Encounter: Payer: Self-pay | Admitting: Internal Medicine

## 2019-02-19 DIAGNOSIS — F329 Major depressive disorder, single episode, unspecified: Secondary | ICD-10-CM

## 2019-02-19 DIAGNOSIS — J4520 Mild intermittent asthma, uncomplicated: Secondary | ICD-10-CM

## 2019-02-19 DIAGNOSIS — R519 Headache, unspecified: Secondary | ICD-10-CM

## 2019-02-19 DIAGNOSIS — F419 Anxiety disorder, unspecified: Secondary | ICD-10-CM | POA: Diagnosis not present

## 2019-02-19 DIAGNOSIS — F3181 Bipolar II disorder: Secondary | ICD-10-CM | POA: Diagnosis not present

## 2019-02-19 DIAGNOSIS — F32A Depression, unspecified: Secondary | ICD-10-CM

## 2019-02-19 MED ORDER — BUTALBITAL-APAP-CAFFEINE 50-325-40 MG PO TABS: 1.0000 | ORAL_TABLET | Freq: Four times a day (QID) | ORAL | 0 refills | Status: DC | PRN

## 2019-02-19 MED ORDER — ALBUTEROL SULFATE HFA 108 (90 BASE) MCG/ACT IN AERS: 2.0000 | INHALATION_SPRAY | Freq: Four times a day (QID) | RESPIRATORY_TRACT | 0 refills | Status: DC | PRN

## 2019-02-19 NOTE — Progress Notes (Signed)
HPI  Pt presents to the clinic today to establish care and for management of the conditions listed below. She is transferring care from Mayra Reel, NP.  Anxiety and Bipolar Depression:  Chronic. Managed on Citalopram, Trileptal and Risperidol. She follows with psychiatry. She is working on scheduleding with a Veterinary surgeon. She denies SI/HI.  Childhood Asthma: Currently does not have an Albuterol inhaler but would like one. She reports she really only has symptoms now when she has a cold.   Frequent Headaches: This started about 1 year ago. These occur almost daily. They are located in her forehead and temples. She describes the pain as throbbing and aching. She reports some sensitivity to sound but denies dizziness, visual changes, sensitivity to light, nausea or vomiting. She takes Tylenol or Advil as needed with some relief. She is not following with neurology.  Flu: 10/2018 Tetanus: 07/2016 Covid: 01/2019 Pap Smear: 01/2016 Vision Screening: as needed Dentist: biannually.  Past Medical History:  Diagnosis Date  . Anxiety and depression   . Asthma    inhaler prn  . Fetal demise > 22 weeks, delivered, current hospitalization 11/04/2015  . Miscarriage     Current Outpatient Medications  Medication Sig Dispense Refill  . citalopram (CELEXA) 20 MG tablet TAKE 1 TABLET BY MOUTH DAILY 30 tablet 2  . Oxcarbazepine (TRILEPTAL) 300 MG tablet TAKE 3 TABLETS BY MOUTH EVERY MORNING AND 3 TABLETS BY MOUTH AT BEDTIME 180 tablet 2  . risperiDONE (RISPERDAL) 0.5 MG tablet Take 1 tablet (0.5 mg total) by mouth at bedtime. 30 tablet 2   No current facility-administered medications for this visit.    No Known Allergies  Family History  Problem Relation Age of Onset  . Hypertension Maternal Grandmother   . Prostate cancer Maternal Grandfather   . Hearing loss Brother   . Hearing loss Paternal Grandfather   . Hypertension Father   . Other Neg Hx     Social History   Socioeconomic History  .  Marital status: Married    Spouse name: Not on file  . Number of children: 2  . Years of education: Not on file  . Highest education level: Not on file  Occupational History  . Not on file  Tobacco Use  . Smoking status: Former Smoker    Packs/day: 0.25    Types: Cigarettes    Quit date: 11/14/2018    Years since quitting: 0.2  . Smokeless tobacco: Never Used  Substance and Sexual Activity  . Alcohol use: No  . Drug use: No  . Sexual activity: Yes  Other Topics Concern  . Not on file  Social History Narrative   Married.   2 children.   Works at [employer].   Enjoys camping, being outdoors.    Social Determinants of Health   Financial Resource Strain:   . Difficulty of Paying Living Expenses: Not on file  Food Insecurity:   . Worried About Programme researcher, broadcasting/film/video in the Last Year: Not on file  . Ran Out of Food in the Last Year: Not on file  Transportation Needs:   . Lack of Transportation (Medical): Not on file  . Lack of Transportation (Non-Medical): Not on file  Physical Activity:   . Days of Exercise per Week: Not on file  . Minutes of Exercise per Session: Not on file  Stress:   . Feeling of Stress : Not on file  Social Connections:   . Frequency of Communication with Friends and Family: Not on file  .  Frequency of Social Gatherings with Friends and Family: Not on file  . Attends Religious Services: Not on file  . Active Member of Clubs or Organizations: Not on file  . Attends Archivist Meetings: Not on file  . Marital Status: Not on file  Intimate Partner Violence:   . Fear of Current or Ex-Partner: Not on file  . Emotionally Abused: Not on file  . Physically Abused: Not on file  . Sexually Abused: Not on file    ROS:  Constitutional: Pt reports frequent headaches. Denies fever, malaise, fatigue, or abrupt weight changes.  HEENT: Denies eye pain, eye redness, ear pain, ringing in the ears, wax buildup, runny nose, nasal congestion, bloody nose, or  sore throat. Respiratory: Denies difficulty breathing, shortness of breath, cough or sputum production.   Cardiovascular: Denies chest pain, chest tightness, palpitations or swelling in the hands or feet.  Gastrointestinal: Denies abdominal pain, bloating, constipation, diarrhea or blood in the stool.  GU: Denies frequency, urgency, pain with urination, blood in urine, odor or discharge. Musculoskeletal: Denies decrease in range of motion, difficulty with gait, muscle pain or joint pain and swelling.  Skin: Denies redness, rashes, lesions or ulcercations.  Neurological: Denies dizziness, difficulty with memory, difficulty with speech or problems with balance and coordination.  Psych: Pt has a history of anxiety and depression. Denies SI/HI.  No other specific complaints in a complete review of systems (except as listed in HPI above).  PE:  BP 122/82   Pulse 81   Temp 98.6 F (37 C) (Temporal)   Wt 243 lb (110.2 kg)   SpO2 98%   BMI 41.71 kg/m   Wt Readings from Last 3 Encounters:  12/09/18 245 lb 3 oz (111.2 kg)  10/13/18 234 lb 4 oz (106.3 kg)  09/01/18 229 lb 8 oz (104.1 kg)    General: Appears her stated age, obese, in NAD. Cardiovascular: Normal rate and rhythm.  Pulmonary/Chest: Normal effort and positive vesicular breath sounds. No respiratory distress.  Musculoskeletal:  No difficulty with gait.  Neurological: Alert and oriented. Coordination normal.  Psychiatric: Mood and affect normal. Behavior is normal. Judgment and thought content normal.     BMET    Component Value Date/Time   NA 139 10/14/2018 1421   NA 136 06/30/2015 0000   NA 137 07/28/2011 1558   K 4.1 10/14/2018 1421   K 3.7 07/28/2011 1558   CL 105 10/14/2018 1421   CL 107 07/28/2011 1558   CO2 29 10/14/2018 1421   CO2 22 07/28/2011 1558   GLUCOSE 81 10/14/2018 1421   GLUCOSE 76 07/28/2011 1558   BUN 17 10/14/2018 1421   BUN 9 06/30/2015 0000   BUN 11 07/28/2011 1558   CREATININE 0.85  10/14/2018 1421   CREATININE 0.93 07/28/2011 1558   CALCIUM 9.3 10/14/2018 1421   CALCIUM 9.1 07/28/2011 1558   GFRNONAA 118 06/30/2015 0000   GFRNONAA >60 07/28/2011 1558   GFRAA 136 06/30/2015 0000   GFRAA >60 07/28/2011 1558    Lipid Panel     Component Value Date/Time   CHOL 211 (H) 07/16/2018 1036   CHOL 194 06/30/2015 0000   TRIG 89.0 07/16/2018 1036   HDL 47.90 07/16/2018 1036   HDL 57 06/30/2015 0000   CHOLHDL 4 07/16/2018 1036   VLDL 17.8 07/16/2018 1036   LDLCALC 145 (H) 07/16/2018 1036   LDLCALC 103 (H) 06/30/2015 0000    CBC    Component Value Date/Time   WBC 9.5 10/14/2018 1421  RBC 4.66 10/14/2018 1421   HGB 13.2 10/14/2018 1421   HGB 12.6 06/30/2015 0000   HCT 40.3 10/14/2018 1421   HCT 38.1 06/30/2015 0000   PLT 291.0 10/14/2018 1421   PLT 320 06/30/2015 0000   MCV 86.4 10/14/2018 1421   MCV 84 06/30/2015 0000   MCV 89 07/28/2011 1558   MCH 27.9 09/25/2016 0454   MCHC 32.9 10/14/2018 1421   RDW 13.2 10/14/2018 1421   RDW 13.9 06/30/2015 0000   RDW 13.3 07/28/2011 1558   LYMPHSABS 2.4 06/30/2015 0000   EOSABS 0.2 06/30/2015 0000   BASOSABS 0.0 06/30/2015 0000    Hgb A1C Lab Results  Component Value Date   HGBA1C 5.0 06/30/2015     Assessment and Plan:   Nicki Reaper, NP This visit occurred during the SARS-CoV-2 public health emergency.  Safety protocols were in place, including screening questions prior to the visit, additional usage of staff PPE, and extensive cleaning of exam room while observing appropriate contact time as indicated for disinfecting solutions.

## 2019-02-22 DIAGNOSIS — R519 Headache, unspecified: Secondary | ICD-10-CM | POA: Insufficient documentation

## 2019-02-22 DIAGNOSIS — J45909 Unspecified asthma, uncomplicated: Secondary | ICD-10-CM | POA: Insufficient documentation

## 2019-02-22 NOTE — Assessment & Plan Note (Signed)
Stable on Citalopram, Trileptal and Risperidol She will continue to follow with psychiatry Support offered

## 2019-02-22 NOTE — Assessment & Plan Note (Signed)
Albuterol inhaler refilled today °

## 2019-02-22 NOTE — Assessment & Plan Note (Signed)
Stable on Citalopram, Trileptal and Risperidol She will continue to follow with psychiatry

## 2019-02-22 NOTE — Assessment & Plan Note (Signed)
Will start Fioricet Will reach out to Dr. Elna Breslow and see what recommendations she has for preventative therapy

## 2019-02-22 NOTE — Patient Instructions (Signed)
Form - Headache Record There are many types and causes of headaches. A headache record can help guide your treatment plan. Use this form to record the details. Bring this form with you to your follow-up visits. Follow your health care provider's instructions on how to describe your headache. You may be asked to:  Use a pain scale. This is a tool to rate the intensity of your headache using words or numbers.  Describe what your headache feels like, such as dull, achy, throbbing, or sharp. Headache record Date: _______________ Time (from start to end): ____________________ Location of the headache: _________________________  Intensity of the headache: ____________________ Description of the headache: ______________________________________________________________  Hours of sleep the night before the headache: __________  Food or drinks before the headache started: ______________________________________________________________________________________  Events before the headache started: _______________________________________________________________________________________________  Symptoms before the headache started: __________________________________________________________________________________________  Symptoms during the headache: __________________________________________________________________________________________________  Treatment: ________________________________________________________________________________________________________________  Effect of treatment: _________________________________________________________________________________________________________  Other comments: ___________________________________________________________________________________________________________ Date: _______________ Time (from start to end): ____________________ Location of the headache: _________________________  Intensity of the headache: ____________________ Description of the  headache: ______________________________________________________________  Hours of sleep the night before the headache: __________  Food or drinks before the headache started: ______________________________________________________________________________________  Events before the headache started: ____________________________________________________________________________________________  Symptoms before the headache started: _________________________________________________________________________________________  Symptoms during the headache: _______________________________________________________________________________________________  Treatment: ________________________________________________________________________________________________________________  Effect of treatment: _________________________________________________________________________________________________________  Other comments: ___________________________________________________________________________________________________________ Date: _______________ Time (from start to end): ____________________ Location of the headache: _________________________  Intensity of the headache: ____________________ Description of the headache: ______________________________________________________________  Hours of sleep the night before the headache: __________  Food or drinks before the headache started: ______________________________________________________________________________________  Events before the headache started: ____________________________________________________________________________________________  Symptoms before the headache started: _________________________________________________________________________________________  Symptoms during the headache: _______________________________________________________________________________________________  Treatment:  ________________________________________________________________________________________________________________  Effect of treatment: _________________________________________________________________________________________________________  Other comments: ___________________________________________________________________________________________________________ Date: _______________ Time (from start to end): ____________________ Location of the headache: _________________________  Intensity of the headache: ____________________ Description of the headache: ______________________________________________________________  Hours of sleep the night before the headache: _________  Food or drinks before the headache started: ______________________________________________________________________________________  Events before the headache started: ____________________________________________________________________________________________  Symptoms before the headache started: _________________________________________________________________________________________  Symptoms during the headache: _______________________________________________________________________________________________  Treatment: ________________________________________________________________________________________________________________  Effect of treatment: _________________________________________________________________________________________________________  Other comments: ___________________________________________________________________________________________________________ Date: _______________ Time (from start to end): ____________________ Location of the headache: _________________________  Intensity of the headache: ____________________ Description of the headache: ______________________________________________________________  Hours of sleep the night before the headache: _________  Food or drinks  before the headache started: ______________________________________________________________________________________  Events before the headache started: ____________________________________________________________________________________________  Symptoms before the headache started: _________________________________________________________________________________________  Symptoms during the headache: _______________________________________________________________________________________________  Treatment: ________________________________________________________________________________________________________________  Effect of treatment: _________________________________________________________________________________________________________  Other comments: ___________________________________________________________________________________________________________ This information is not intended to replace advice given to you by your health care provider. Make sure you discuss any questions you have with your health care provider. Document Revised: 02/03/2018 Document Reviewed: 02/03/2018 Elsevier Patient Education  2020 [employer]  

## 2019-02-23 NOTE — Progress Notes (Signed)
She has a diagnosis of Bipolar type 2 , she is already of Celexa, risperidone and trileptal. If you are going to add Pamelor or another TCA , please lower celexa and stop it in a couple of weeks and may be she can use her TCA for antidepressant and anti anxiety effect too . Too much of AD can make her more anxious and may be hypomanic and also there is concern of serotonin syndrome.  If you do not want to do that try another head ache medication which does not have that interaction.  Hope this helps.

## 2019-03-03 ENCOUNTER — Other Ambulatory Visit: Payer: Self-pay

## 2019-03-03 ENCOUNTER — Encounter: Payer: Self-pay | Admitting: Psychiatry

## 2019-03-03 ENCOUNTER — Ambulatory Visit (INDEPENDENT_AMBULATORY_CARE_PROVIDER_SITE_OTHER): Payer: 59 | Admitting: Psychiatry

## 2019-03-03 DIAGNOSIS — F410 Panic disorder [episodic paroxysmal anxiety] without agoraphobia: Secondary | ICD-10-CM

## 2019-03-03 DIAGNOSIS — F17200 Nicotine dependence, unspecified, uncomplicated: Secondary | ICD-10-CM | POA: Diagnosis not present

## 2019-03-03 DIAGNOSIS — F3178 Bipolar disorder, in full remission, most recent episode mixed: Secondary | ICD-10-CM | POA: Diagnosis not present

## 2019-03-03 DIAGNOSIS — F3177 Bipolar disorder, in partial remission, most recent episode mixed: Secondary | ICD-10-CM | POA: Insufficient documentation

## 2019-03-03 NOTE — Progress Notes (Signed)
Provider Location : [facility] Patient Location : Home   Virtual Visit via Video Note  I connected with Jill Sandoval on 03/03/19 at  4:40 PM EST by a video enabled telemedicine application and verified that I am speaking with the correct person using two identifiers.   I discussed the limitations of evaluation and management by telemedicine and the availability of in person appointments. The patient expressed understanding and agreed to proceed.     I discussed the assessment and treatment plan with the patient. The patient was provided an opportunity to ask questions and all were answered. The patient agreed with the plan and demonstrated an understanding of the instructions.   The patient was advised to call back or seek an in-person evaluation if the symptoms worsen or if the condition fails to improve as anticipated.  [facility] MD OP Progress Note  03/03/2019 4:59 PM Jill Sandoval  MRN:  [id number]  Chief Complaint:  Chief Complaint    Follow-up     HPI: Jill Sandoval is a 29 year old Caucasian female, married, employed, currently lives in [place], has a history of bipolar disorder type II, asthma, tobacco use disorder, panic attacks was evaluated by telemedicine today.  Patient today reports she is currently doing well on the current medication regimen.  She denies any significant mood lability.  She is compliant on medications as prescribed.  She reports sleep and appetite is good.  She reports she currently has a headache and was recently started on a medication for the same however she does not remember the name.  She denies any suicidality, homicidality or perceptual disturbances.  Patient reports she is interested in psychotherapy sessions however has been unable to find a therapist.  Will refer her to our coordinator.  She is agreeable.   Visit Diagnosis:    ICD-10-CM   1. Bipolar disorder, in full remission, most recent episode mixed (HCC)  F31.78   2. Panic attacks  F41.0    3. Tobacco use disorder  F17.200    remission    Past Psychiatric History: Reviewed past psychiatric history from my progress note on 10/15/2018.  Past trials of Celexa, Trileptal.  Past Medical History:  Past Medical History:  Diagnosis Date  . Anxiety and depression   . Asthma    inhaler prn  . Fetal demise > 22 weeks, delivered, current hospitalization 11/04/2015  . Miscarriage     Past Surgical History:  Procedure Laterality Date  . CESAREAN SECTION N/A 03/24/2012   Procedure: CESAREAN SECTION;  Surgeon: Mickel Baas, MD;  Location: [facility];  Service: Obstetrics;  Laterality: N/A;  . CESAREAN SECTION N/A 09/24/2016   Procedure: CESAREAN SECTION;  Surgeon: Waynard Reeds, MD;  Location: [facility]IR[facility];  Service: Obstetrics;  Laterality: N/A;    Family Psychiatric History: Reviewed family psychiatric history from my progress note on 10/15/2018. Family History:  Family History  Problem Relation Age of Onset  . Hypertension Maternal Grandmother   . Prostate cancer Maternal Grandfather   . Hearing loss Brother   . Hearing loss Paternal Grandfather   . Hypertension Father   . Other Neg Hx     Social History: Reviewed social history from my progress note on 10/15/2018. Social History   Socioeconomic History  . Marital status: Married    Spouse name: Not on file  . Number of children: 2  . Years of education: Not on file  . Highest education level: Not on file  Occupational History  . Not on file  Tobacco Use  . Smoking status: Former Smoker    Packs/day: 0.25    Types: Cigarettes    Quit date: 11/14/2018    Years since quitting: 0.2  . Smokeless tobacco: Never Used  Substance and Sexual Activity  . Alcohol use: No  . Drug use: No  . Sexual activity: Yes  Other Topics Concern  . Not on file  Social History Narrative   Married.   2 children.   Works at [employer].   Enjoys camping, being outdoors.    Social Determinants of Health   Financial Resource  Strain:   . Difficulty of Paying Living Expenses: Not on file  Food Insecurity:   . Worried About Programme researcher, broadcasting/film/video in the Last Year: Not on file  . Ran Out of Food in the Last Year: Not on file  Transportation Needs:   . Lack of Transportation (Medical): Not on file  . Lack of Transportation (Non-Medical): Not on file  Physical Activity:   . Days of Exercise per Week: Not on file  . Minutes of Exercise per Session: Not on file  Stress:   . Feeling of Stress : Not on file  Social Connections:   . Frequency of Communication with Friends and Family: Not on file  . Frequency of Social Gatherings with Friends and Family: Not on file  . Attends Religious Services: Not on file  . Active Member of Clubs or Organizations: Not on file  . Attends Banker Meetings: Not on file  . Marital Status: Not on file    Allergies: No Known Allergies  Metabolic Disorder Labs: Lab Results  Component Value Date   HGBA1C 5.0 06/30/2015   No results found for: PROLACTIN Lab Results  Component Value Date   CHOL 211 (H) 07/16/2018   TRIG 89.0 07/16/2018   HDL 47.90 07/16/2018   CHOLHDL 4 07/16/2018   VLDL 17.8 07/16/2018   LDLCALC 145 (H) 07/16/2018   LDLCALC 103 (H) 06/30/2015   Lab Results  Component Value Date   TSH 1.18 10/15/2018   TSH 1.270 06/30/2015    Therapeutic Level Labs: No results found for: LITHIUM No results found for: VALPROATE No components found for:  CBMZ  Current Medications: Current Outpatient Medications  Medication Sig Dispense Refill  . albuterol (VENTOLIN HFA) 108 (90 Base) MCG/ACT inhaler Inhale 2 puffs into the lungs every 6 (six) hours as needed for wheezing or shortness of breath. 18 g 0  . butalbital-acetaminophen-caffeine (FIORICET) 50-325-40 MG tablet Take 1-2 tablets by mouth every 6 (six) hours as needed for headache. 20 tablet 0  . citalopram (CELEXA) 20 MG tablet TAKE 1 TABLET BY MOUTH DAILY 30 tablet 2  . Oxcarbazepine (TRILEPTAL) 300  MG tablet TAKE 3 TABLETS BY MOUTH EVERY MORNING AND 3 TABLETS BY MOUTH AT BEDTIME 180 tablet 2  . risperiDONE (RISPERDAL) 0.5 MG tablet Take 1 tablet (0.5 mg total) by mouth at bedtime. 30 tablet 2   No current facility-administered medications for this visit.     Musculoskeletal: Strength & Muscle Tone: UTA  Gait & Station: normal Patient leans: N/A  Psychiatric Specialty Exam: Review of Systems  Neurological: Positive for headaches.  Psychiatric/Behavioral: Negative for agitation, behavioral problems, confusion, decreased concentration, dysphoric mood, hallucinations, self-injury and sleep disturbance. The patient is not nervous/anxious and is not hyperactive.   All other systems reviewed and are negative.   There were no vitals taken for this visit.There is no height or weight on file to calculate BMI.  General Appearance: Casual  Eye Contact:  Fair  Speech:  Clear and Coherent  Volume:  Normal  Mood:  Euthymic  Affect:  Congruent  Thought Process:  Goal Directed and Descriptions of Associations: Intact  Orientation:  Full (Time, Place, and Person)  Thought Content: Logical   Suicidal Thoughts:  No  Homicidal Thoughts:  No  Memory:  Immediate;   Fair Recent;   Fair Remote;   Fair  Judgement:  Fair  Insight:  Fair  Psychomotor Activity:  Normal  Concentration:  Concentration: Fair and Attention Span: Fair  Recall:  [employer] of Knowledge: Fair  Language: Fair  Akathisia:  No  Handed:  Right  AIMS (if indicated): UTA  Assets:  Communication Skills Desire for Improvement Housing Social Support  ADL's:  Intact  Cognition: WNL  Sleep:  Fair   Screenings: GAD-7     Office Visit from 07/08/2018 in [place] at [facility]  Total GAD-7 Score  8    PHQ2-9     Office Visit from 07/08/2018 in [place] at [facility] Total Score  3  PHQ-9 Total Score  15       Assessment and Plan: Hensley is a 29 year old Caucasian female, married,  employed, lives in [place], has a history of bipolar disorder, panic attacks, tobacco use disorder was evaluated by telemedicine today.  She is biologically predisposed given her history of trauma and family history.  Patient with psychosocial stressors of the pandemic and relationship struggles.  Patient is currently doing well on current medication regimen.  Plan as noted below.  Plan Bipolar disorder type II in remission Risperidone 0.5 mg p.o. nightly Trileptal 900 mg p.o. twice daily Celexa 20 mg p.o. daily  Panic attacks-stable Celexa as prescribed  Tobacco use disorder-in remission We will monitor closely.  Patient was advised to establish care with therapist last visit-patient has not been able to do so.  Will refer her to [facility] our coordinator.  Pending EKG to monitor QTC-encouraged to do so.  Follow-up in clinic in 3 months or sooner if needed.  May 6 at 4:40 PM  I have spent atleast 20 minutes non face to face with patient today. More than 50 % of the time was spent for  ordering medications and test ,psychoeducation and supportive psychotherapy and care coordination,as well as documenting clinical information in electronic health record. This note was generated in part or whole with voice recognition software. Voice recognition is usually quite accurate but there are transcription errors that can and very often do occur. I apologize for any typographical errors that were not detected and corrected.         Ursula Alert, MD 03/03/2019, 4:59 PM

## 2019-03-04 ENCOUNTER — Other Ambulatory Visit: Payer: Self-pay | Admitting: Internal Medicine

## 2019-03-04 MED ORDER — BUTALBITAL-APAP-CAFFEINE 50-325-40 MG PO TABS: 2.0000 | ORAL_TABLET | Freq: Every day | ORAL | 2 refills | Status: DC | PRN

## 2019-03-04 MED ORDER — NORTRIPTYLINE HCL 10 MG PO CAPS: 10.0000 mg | ORAL_CAPSULE | Freq: Every day | ORAL | 2 refills | Status: DC

## 2019-04-23 ENCOUNTER — Encounter: Payer: Self-pay | Admitting: Internal Medicine

## 2019-04-23 NOTE — Telephone Encounter (Signed)
FMLA paperwork in [employer] in box for review and signature

## 2019-04-24 ENCOUNTER — Ambulatory Visit (INDEPENDENT_AMBULATORY_CARE_PROVIDER_SITE_OTHER): Payer: 59 | Admitting: Internal Medicine

## 2019-04-24 ENCOUNTER — Encounter: Payer: Self-pay | Admitting: Internal Medicine

## 2019-04-24 ENCOUNTER — Other Ambulatory Visit: Payer: Self-pay

## 2019-04-24 VITALS — BP 124/76 | HR 94 | Temp 99.0°F | Wt 248.0 lb

## 2019-04-24 DIAGNOSIS — G43C1 Periodic headache syndromes in child or adult, intractable: Secondary | ICD-10-CM

## 2019-04-24 MED ORDER — NORTRIPTYLINE HCL 25 MG PO CAPS: 25.0000 mg | ORAL_CAPSULE | Freq: Every day | ORAL | 2 refills | Status: DC

## 2019-04-24 MED ORDER — SUMATRIPTAN SUCCINATE 25 MG PO TABS: 25.0000 mg | ORAL_TABLET | ORAL | 2 refills | Status: DC | PRN

## 2019-04-24 NOTE — Progress Notes (Signed)
Subjective:    Patient ID: Jill Sandoval, female    DOB: 1990-12-24, 29 y.o.   MRN: [id number]  HPI  Pt presents to the clinic today for follow up of migraines. The migraines are located in her forehead and behind her eyes. She describes the pain as throbbing .She denies dizziness or visual changes but has had some sensitivity to light, sound and nausea. She denies neck pain. These typically occur 2 x a month but has had 3 in the last week. They typically last 1-2 hours. She was started on Nortriptyline 03/2018. She takes Fioricet for breakthrough but reports it really is not effective. She is not sure what triggers this, possibly stress at home and at work. She has never had an eye exam. She is requesting FMLA forms to be completed.   Review of Systems      Past Medical History:  Diagnosis Date  . Anxiety and depression   . Asthma    inhaler prn  . Fetal demise > 22 weeks, delivered, current hospitalization 11/04/2015  . Miscarriage     Current Outpatient Medications  Medication Sig Dispense Refill  . albuterol (VENTOLIN HFA) 108 (90 Base) MCG/ACT inhaler Inhale 2 puffs into the lungs every 6 (six) hours as needed for wheezing or shortness of breath. 18 g 0  . butalbital-acetaminophen-caffeine (FIORICET) 50-325-40 MG tablet Take 2 tablets by mouth daily as needed for headache. 30 tablet 2  . citalopram (CELEXA) 20 MG tablet TAKE 1 TABLET BY MOUTH DAILY 30 tablet 2  . nortriptyline (PAMELOR) 10 MG capsule Take 1 capsule (10 mg total) by mouth at bedtime. 30 capsule 2  . Oxcarbazepine (TRILEPTAL) 300 MG tablet TAKE 3 TABLETS BY MOUTH EVERY MORNING AND 3 TABLETS BY MOUTH AT BEDTIME 180 tablet 2  . risperiDONE (RISPERDAL) 0.5 MG tablet Take 1 tablet (0.5 mg total) by mouth at bedtime. 30 tablet 2   No current facility-administered medications for this visit.    No Known Allergies  Family History  Problem Relation Age of Onset  . Hypertension Maternal Grandmother   . Prostate  cancer Maternal Grandfather   . Hearing loss Brother   . Hearing loss Paternal Grandfather   . Hypertension Father   . Other Neg Hx     Social History   Socioeconomic History  . Marital status: Married    Spouse name: Not on file  . Number of children: 2  . Years of education: Not on file  . Highest education level: Not on file  Occupational History  . Not on file  Tobacco Use  . Smoking status: Former Smoker    Packs/day: 0.25    Types: Cigarettes    Quit date: 11/14/2018    Years since quitting: 0.4  . Smokeless tobacco: Never Used  Substance and Sexual Activity  . Alcohol use: No  . Drug use: No  . Sexual activity: Yes  Other Topics Concern  . Not on file  Social History Narrative   Married.   2 children.   Works at [employer].   Enjoys camping, being outdoors.    Social Determinants of Health   Financial Resource Strain:   . Difficulty of Paying Living Expenses:   Food Insecurity:   . Worried About Charity fundraiser in the Last Year:   . Arboriculturist in the Last Year:   Transportation Needs:   . Film/video editor (Medical):   Marland Kitchen Lack of Transportation (Non-Medical):   Physical Activity:   .  Days of Exercise per Week:   . Minutes of Exercise per Session:   Stress:   . Feeling of Stress :   Social Connections:   . Frequency of Communication with Friends and Family:   . Frequency of Social Gatherings with Friends and Family:   . Attends Religious Services:   . Active Member of Clubs or Organizations:   . Attends Banker Meetings:   Marland Kitchen Marital Status:   Intimate Partner Violence:   . Fear of Current or Ex-Partner:   . Emotionally Abused:   Marland Kitchen Physically Abused:   . Sexually Abused:      Constitutional: Pt reports migraines. Denies fever, malaise, fatigue, or abrupt weight changes.  HEENT: Pt reports sensitivity to light and sound. Denies eye pain, eye redness, ear pain, ringing in the ears, wax buildup, runny nose, nasal congestion,  bloody nose, or sore throat. Respiratory: Denies difficulty breathing, shortness of breath, cough or sputum production.   Cardiovascular: Denies chest pain, chest tightness, palpitations or swelling in the hands or feet.  Gastrointestinal: Pt reports nausea. Denies abdominal pain, bloating, constipation, diarrhea or blood in the stool.  Musculoskeletal: Denies decrease in range of motion, difficulty with gait, muscle pain or joint pain and swelling.  Neurological:Denies dizziness, difficulty with memory, difficulty with speech or problems with balance and coordination.  Psych: Pt reports anxiety and depression. Denies SI/HI.  No other specific complaints in a complete review of systems (except as listed in HPI above).  Objective:   Physical Exam   BP 124/76   Pulse 94   Temp 99 F (37.2 C) (Temporal)   Wt 248 lb (112.5 kg)   SpO2 98%   BMI 42.57 kg/m   Wt Readings from Last 3 Encounters:  02/19/19 243 lb (110.2 kg)  12/09/18 245 lb 3 oz (111.2 kg)  10/13/18 234 lb 4 oz (106.3 kg)    General: Appears herstated age, obese, in NAD. HEENT: Head: normal shape and size; Eyes: sclera white and EOMs intact;  Pulmonary/Chest: Normal effort. Neurological: Alert and oriented. Cranial nerves II-XII grossly intact. Coordination normal.  Psychiatric: Mood and affect normal. Behavior is normal. Judgment and thought content normal.     BMET    Component Value Date/Time   NA 139 10/14/2018 1421   NA 136 06/30/2015 0000   NA 137 07/28/2011 1558   K 4.1 10/14/2018 1421   K 3.7 07/28/2011 1558   CL 105 10/14/2018 1421   CL 107 07/28/2011 1558   CO2 29 10/14/2018 1421   CO2 22 07/28/2011 1558   GLUCOSE 81 10/14/2018 1421   GLUCOSE 76 07/28/2011 1558   BUN 17 10/14/2018 1421   BUN 9 06/30/2015 0000   BUN 11 07/28/2011 1558   CREATININE 0.85 10/14/2018 1421   CREATININE 0.93 07/28/2011 1558   CALCIUM 9.3 10/14/2018 1421   CALCIUM 9.1 07/28/2011 1558   GFRNONAA 118 06/30/2015 0000     GFRNONAA >60 07/28/2011 1558   GFRAA 136 06/30/2015 0000   GFRAA >60 07/28/2011 1558    Lipid Panel     Component Value Date/Time   CHOL 211 (H) 07/16/2018 1036   CHOL 194 06/30/2015 0000   TRIG 89.0 07/16/2018 1036   HDL 47.90 07/16/2018 1036   HDL 57 06/30/2015 0000   CHOLHDL 4 07/16/2018 1036   VLDL 17.8 07/16/2018 1036   LDLCALC 145 (H) 07/16/2018 1036   LDLCALC 103 (H) 06/30/2015 0000    CBC    Component Value Date/Time  WBC 9.5 10/14/2018 1421   RBC 4.66 10/14/2018 1421   HGB 13.2 10/14/2018 1421   HGB 12.6 06/30/2015 0000   HCT 40.3 10/14/2018 1421   HCT 38.1 06/30/2015 0000   PLT 291.0 10/14/2018 1421   PLT 320 06/30/2015 0000   MCV 86.4 10/14/2018 1421   MCV 84 06/30/2015 0000   MCV 89 07/28/2011 1558   MCH 27.9 09/25/2016 0454   MCHC 32.9 10/14/2018 1421   RDW 13.2 10/14/2018 1421   RDW 13.9 06/30/2015 0000   RDW 13.3 07/28/2011 1558   LYMPHSABS 2.4 06/30/2015 0000   EOSABS 0.2 06/30/2015 0000   BASOSABS 0.0 06/30/2015 0000    Hgb A1C Lab Results  Component Value Date   HGBA1C 5.0 06/30/2015           Assessment & Plan:   Migraines:  Deteriorated Increase Nortriptyline to 205 mg daily Stop Fioricet, RX for Imitrex 25 mg daily RX for Zofran 4 mg TID prn for nausea Encouraged stress reduction She will continue to follow with psych for management of her anxiety /depression She has an appt with a therapist scheduled Encouraged her to get an eye exam/start wearing blue reducing glasses while at work She will check with GYN if there is an alternative birth control that would help control her migraines better. Consider referral to neurology at next follow up FMLA forms completed for 3 days this month and 2 days going forward  Return precautions discussed Nicki Reaper, NP This visit occurred during the SARS-CoV-2 public health emergency.  Safety protocols were in place, including screening questions prior to the visit, additional usage of  staff PPE, and extensive cleaning of exam room while observing appropriate contact time as indicated for disinfecting solutions.

## 2019-04-25 MED ORDER — ONDANSETRON HCL 4 MG PO TABS: 4.0000 mg | ORAL_TABLET | Freq: Three times a day (TID) | ORAL | 0 refills | Status: DC | PRN

## 2019-04-25 NOTE — Patient Instructions (Signed)

## 2019-04-27 ENCOUNTER — Ambulatory Visit: Payer: 59 | Admitting: Internal Medicine

## 2019-04-28 ENCOUNTER — Other Ambulatory Visit: Payer: Self-pay

## 2019-04-28 ENCOUNTER — Ambulatory Visit: Payer: 59 | Admitting: Licensed Clinical Social Worker

## 2019-04-28 ENCOUNTER — Telehealth: Payer: Self-pay | Admitting: Licensed Clinical Social Worker

## 2019-04-28 NOTE — Telephone Encounter (Signed)
Therapist attempted to reach patient after no showing virtual visit for [facility]. Pt's phone rang several times before going to voicemail. Therapist left message with contact information requesting callback.

## 2019-05-04 ENCOUNTER — Other Ambulatory Visit: Payer: Self-pay | Admitting: Psychiatry

## 2019-05-04 DIAGNOSIS — F3177 Bipolar disorder, in partial remission, most recent episode mixed: Secondary | ICD-10-CM

## 2019-05-13 DIAGNOSIS — H5213 Myopia, bilateral: Secondary | ICD-10-CM | POA: Diagnosis not present

## 2019-05-13 DIAGNOSIS — H52221 Regular astigmatism, right eye: Secondary | ICD-10-CM | POA: Diagnosis not present

## 2019-06-04 ENCOUNTER — Ambulatory Visit: Payer: 59 | Admitting: Psychiatry

## 2019-06-22 ENCOUNTER — Other Ambulatory Visit: Payer: Self-pay | Admitting: Family Medicine

## 2019-06-22 ENCOUNTER — Encounter: Payer: Self-pay | Admitting: Family Medicine

## 2019-06-22 ENCOUNTER — Other Ambulatory Visit: Payer: Self-pay

## 2019-06-22 ENCOUNTER — Ambulatory Visit (INDEPENDENT_AMBULATORY_CARE_PROVIDER_SITE_OTHER): Payer: 59 | Admitting: Family Medicine

## 2019-06-22 DIAGNOSIS — H1032 Unspecified acute conjunctivitis, left eye: Secondary | ICD-10-CM | POA: Diagnosis not present

## 2019-06-22 MED ORDER — NEOMYCIN-POLYMYXIN-HC OP SUSP: 1.0000 [drp] | Freq: Three times a day (TID) | OPHTHALMIC | 0 refills | Status: DC

## 2019-06-22 NOTE — Patient Instructions (Signed)
Wash hands and linens and towels  Wipe away crust with a warm wet wash cloth   Cool compresses are good for discomfort   Use the eye drops as directed   No contacts until 100% better   Update if not starting to improve in a week or if worsening

## 2019-06-22 NOTE — Assessment & Plan Note (Signed)
Based on symptoms most likely viral  Will however tx with cortisporin opthy to cover bacterial as well  Disc use of warm wet cloth to wipe d/c (and cool compress for discomfort)  Avoid contacts until better  inst to alert [place] if worse or not imp in a week  Disc contagious nature-see avs

## 2019-06-22 NOTE — Progress Notes (Signed)
Subjective:    Patient ID: Jill Sandoval, female    DOB: 18-Dec-1990, 29 y.o.   MRN: [id number]  This visit occurred during the SARS-CoV-2 public health emergency.  Safety protocols were in place, including screening questions prior to the visit, additional usage of staff PPE, and extensive cleaning of exam room while observing appropriate contact time as indicated for disinfecting solutions.    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 presents with c/o possible pink eye   Yesterday allergies acted up and she took medication -took zyrtec   Does not use eye drops regularly  No swimming    Then L eye started to swell  Felt like it has sand in it  Matted with discharge this am  Itchy  Watery   Vision is ok (except for discharge)   R eye is fine   This is different from allergy eye symptoms   No fb suspected    Trying not to rub /difficult not to   Normally she wears contact lenses  Took them out yesterday mid day  Threw out that lens   Pulse was high on first check  Pulse Readings from Last 3 Encounters:  06/22/19 (!) 108  04/24/19 94  02/19/19 81    Has no known drug allergies  Quit smoking 11/14/18  Her son has allergies  Neither child has eye symptoms right now   Patient Active Problem List   Diagnosis Date Noted  . Acute conjunctivitis of left eye 06/22/2019  . Bipolar disorder, in partial remission, most recent episode mixed ([place]) 03/03/2019  . Frequent headaches 02/22/2019  . Childhood asthma 02/22/2019  . Bipolar 2 disorder ([place]) 10/15/2018  . Panic attacks 10/15/2018  . Tobacco use disorder 10/15/2018  . Anxiety and depression 06/25/2018   Past Medical History:  Diagnosis Date  . Anxiety and depression   . Asthma    inhaler prn  . Fetal demise > 22 weeks, delivered, current hospitalization 11/04/2015  . Miscarriage    Past Surgical History:  Procedure Laterality Date  . CESAREAN SECTION N/A 03/24/2012   Procedure:  CESAREAN SECTION;  Surgeon: Sharene Butters, MD;  Location: [facility];  Service: Obstetrics;  Laterality: N/A;  . CESAREAN SECTION N/A 09/24/2016   Procedure: CESAREAN SECTION;  Surgeon: Vanessa Kick, MD;  Location: [facility];  Service: Obstetrics;  Laterality: N/A;   Social History   Tobacco Use  . Smoking status: Former Smoker    Packs/day: 0.25    Types: Cigarettes    Quit date: 11/14/2018    Years since quitting: 0.6  . Smokeless tobacco: Never Used  Substance Use Topics  . Alcohol use: No  . Drug use: No   Family History  Problem Relation Age of Onset  . Hypertension Maternal Grandmother   . Prostate cancer Maternal Grandfather   . Hearing loss Brother   . Hearing loss Paternal Grandfather   . Hypertension Father   . Other Neg Hx    No Known Allergies Current Outpatient Medications on File Prior to Visit  Medication Sig Dispense Refill  . albuterol (VENTOLIN HFA) 108 (90 Base) MCG/ACT inhaler Inhale 2 puffs into the lungs every 6 (six) hours as needed for wheezing or shortness of breath. 18 g 0  . nortriptyline (PAMELOR) 25 MG capsule Take 1 capsule (25 mg total) by mouth at bedtime. 30 capsule 2  . ondansetron (ZOFRAN) 4 MG tablet Take 1 tablet (4 mg total) by  mouth every 8 (eight) hours as needed. 20 tablet 0  . Oxcarbazepine (TRILEPTAL) 300 MG tablet TAKE 3 TABLETS BY MOUTH EVERY MORNING AND 3 TABLETS BY MOUTH AT BEDTIME 180 tablet 2  . risperiDONE (RISPERDAL) 0.5 MG tablet Take 1 tablet (0.5 mg total) by mouth at bedtime. 30 tablet 2  . SUMAtriptan (IMITREX) 25 MG tablet Take 1 tablet (25 mg total) by mouth every 2 (two) hours as needed for migraine. May repeat in 2 hours if headache persists or recurs. 10 tablet 2   No current facility-administered medications on file prior to visit.    Review of Systems  Constitutional: Negative for activity change, appetite change, fatigue, fever and unexpected weight change.  HENT: Negative for congestion, ear pain,  rhinorrhea, sinus pressure and sore throat.   Eyes: Positive for discharge, redness and itching. Negative for photophobia, pain and visual disturbance.  Respiratory: Negative for cough, shortness of breath and wheezing.   Cardiovascular: Negative for chest pain and palpitations.  Gastrointestinal: Negative for abdominal pain, blood in stool, constipation and diarrhea.  Endocrine: Negative for polydipsia and polyuria.  Genitourinary: Negative for dysuria, frequency and urgency.  Musculoskeletal: Negative for arthralgias, back pain and myalgias.  Skin: Negative for pallor and rash.  Allergic/Immunologic: Negative for environmental allergies.  Neurological: Negative for dizziness, syncope and headaches.  Hematological: Negative for adenopathy. Does not bruise/bleed easily.  Psychiatric/Behavioral: Negative for decreased concentration and dysphoric mood. The patient is not nervous/anxious.        Objective:   Physical Exam Constitutional:      General: She is not in acute distress.    Appearance: Normal appearance. She is obese. She is not ill-appearing.  HENT:     Head: Atraumatic.     Right Ear: Tympanic membrane and ear canal normal.     Left Ear: Tympanic membrane and ear canal normal.  Eyes:     General: Lids are normal. Vision grossly intact. Gaze aligned appropriately. No allergic shiner or scleral icterus.       Left eye: Discharge present.No hordeolum.     Extraocular Movements: Extraocular movements intact.     Right eye: Normal extraocular motion.     Left eye: Normal extraocular motion.     Conjunctiva/sclera:     Right eye: Right conjunctiva is not injected.     Left eye: Left conjunctiva is injected. No hemorrhage.    Pupils: Pupils are equal, round, and reactive to light.     Comments: L eye- mildly injected conjunctiva with clear drainage More injection medially  No external swelling   Cardiovascular:     Rate and Rhythm: Tachycardia present.     Heart sounds:  Normal heart sounds.  Musculoskeletal:     Cervical back: Normal range of motion and neck supple. No rigidity.  Lymphadenopathy:     Cervical: No cervical adenopathy.  Skin:    General: Skin is warm and dry.     Coloration: Skin is not pale.     Findings: No erythema or rash.  Neurological:     Mental Status: She is alert.     Cranial Nerves: No cranial nerve deficit.  Psychiatric:        Mood and Affect: Mood normal.           Assessment & Plan:   Problem List Items Addressed This Visit      Other   Acute conjunctivitis of left eye    Based on symptoms most likely viral  Will however tx  with cortisporin opthy to cover bacterial as well  Disc use of warm wet cloth to wipe d/c (and cool compress for discomfort)  Avoid contacts until better  inst to alert [place] if worse or not imp in a week  Disc contagious nature-see avs

## 2019-06-22 NOTE — Telephone Encounter (Signed)
Note on Rx says: product Backordered/Unavailable:PRESCRIBED PRODUCT NOT IN STOCK. PLEASE CONSIDER THE COST-EFFECTIVE POTENTIAL ALTERNATIVE(S) LISTED AND EVALUATE IF APPROPRIATE FOR YOUR PATIENT'S INDICATION AND TREATMENT GOALS.  Alt meds listed in comments are:  0 neomycin-polymyxin b-dexamethasone (MAXITROL) 3.5-10000-0.1 SUSP 0 dexamethasone (DECADRON) 0.1 % ophthalmic solution 0 gentamicin (GENTAK) 0.3 % ophthalmic ointment

## 2019-06-24 ENCOUNTER — Telehealth: Payer: Self-pay

## 2019-06-24 ENCOUNTER — Encounter: Payer: Self-pay | Admitting: Psychiatry

## 2019-06-24 ENCOUNTER — Telehealth (INDEPENDENT_AMBULATORY_CARE_PROVIDER_SITE_OTHER): Payer: 59 | Admitting: Psychiatry

## 2019-06-24 ENCOUNTER — Other Ambulatory Visit: Payer: Self-pay

## 2019-06-24 DIAGNOSIS — F410 Panic disorder [episodic paroxysmal anxiety] without agoraphobia: Secondary | ICD-10-CM

## 2019-06-24 DIAGNOSIS — Z79899 Other long term (current) drug therapy: Secondary | ICD-10-CM | POA: Diagnosis not present

## 2019-06-24 DIAGNOSIS — F3178 Bipolar disorder, in full remission, most recent episode mixed: Secondary | ICD-10-CM | POA: Insufficient documentation

## 2019-06-24 DIAGNOSIS — F3181 Bipolar II disorder: Secondary | ICD-10-CM

## 2019-06-24 MED ORDER — RISPERIDONE 1 MG PO TABS: 1.0000 mg | ORAL_TABLET | Freq: Every day | ORAL | 1 refills | Status: DC

## 2019-06-24 NOTE — Progress Notes (Signed)
Provider Location : [facility] Patient Location : Home  Virtual Visit via Video Note  I connected with Jill Sandoval on 06/24/19 at  4:30 PM EDT by a video enabled telemedicine application and verified that I am speaking with the correct person using two identifiers.   I discussed the limitations of evaluation and management by telemedicine and the availability of in person appointments. The patient expressed understanding and agreed to proceed.     I discussed the assessment and treatment plan with the patient. The patient was provided an opportunity to ask questions and all were answered. The patient agreed with the plan and demonstrated an understanding of the instructions.   The patient was advised to call back or seek an in-person evaluation if the symptoms worsen or if the condition fails to improve as anticipated.   BH MD OP Progress Note  06/24/2019 5:09 PM Jill Sandoval  MRN:  [id number]  Chief Complaint:  Chief Complaint    Follow-up     HPI: Jill Sandoval is a 29 year old Caucasian female, married, employed, currently lives in [place], has a history of bipolar disorder type II, asthma, tobacco use disorder, panic attacks was evaluated by telemedicine today.  Patient today reports she is currently struggling with irritability on and off.  She also had episodes where she felt sad and could not get out of the bed and did not feel like doing anything for a couple of days on and off.  This has been getting worse since the past 2 weeks or so.  She reports work is currently stressful and she feels she is currently doing a lot more.  She reports they have staffing problem and that does affect work situation.  This also has an impact on her mood.  She reports sleep as okay.  She reports appetite as fair.  Patient reports she is compliant on medications.  Patient denies side effects to medications.  Patient denies any suicidality, homicidality or perceptual disturbances.  She  reports she missed her appointment with therapist due to problems with her work that day.  Encouraged patient to schedule an appointment with the EAP.  Visit Diagnosis:    ICD-10-CM   1. Bipolar 2 disorder, major depressive episode (HCC)  F31.81 risperiDONE (RISPERDAL) 1 MG tablet   current mild  2. Panic attacks  F41.0   3. High risk medication use  Z79.899 Comprehensive metabolic panel    CBC With Differential    10-Hydroxycarbazepine    Past Psychiatric History: I have reviewed past psychiatric history from my progress note on 10/15/2018.  Past trials of Celexa, Trileptal  Past Medical History:  Past Medical History:  Diagnosis Date  . Anxiety and depression   . Asthma    inhaler prn  . Fetal demise > 22 weeks, delivered, current hospitalization 11/04/2015  . Miscarriage     Past Surgical History:  Procedure Laterality Date  . CESAREAN SECTION N/A 03/24/2012   Procedure: CESAREAN SECTION;  Surgeon: Mickel Baas, MD;  Location: [facility];  Service: Obstetrics;  Laterality: N/A;  . CESAREAN SECTION N/A 09/24/2016   Procedure: CESAREAN SECTION;  Surgeon: Waynard Reeds, MD;  Location: [facility] BIRTHING SUIT[facility];  Service: Obstetrics;  Laterality: N/A;    Family Psychiatric History: I have reviewed family psychiatric history from my progress note on 10/15/2018  Family History:  Family History  Problem Relation Age of Onset  . Hypertension Maternal Grandmother   . Prostate cancer Maternal Grandfather   . Hearing loss Brother   .  Hearing loss Paternal Grandfather   . Hypertension Father   . Other Neg Hx     Social History: Reviewed social history from my progress note on 10/15/2018 Social History   Socioeconomic History  . Marital status: Married    Spouse name: Not on file  . Number of children: 2  . Years of education: Not on file  . Highest education level: Not on file  Occupational History  . Not on file  Tobacco Use  . Smoking status: Former Smoker    Packs/day: 0.25     Types: Cigarettes    Quit date: 11/14/2018    Years since quitting: 0.6  . Smokeless tobacco: Never Used  Substance and Sexual Activity  . Alcohol use: No  . Drug use: No  . Sexual activity: Yes  Other Topics Concern  . Not on file  Social History Narrative   Married.   2 children.   Works at [employer].   Enjoys camping, being outdoors.    Social Determinants of Health   Financial Resource Strain:   . Difficulty of Paying Living Expenses:   Food Insecurity:   . Worried About Charity fundraiser in the Last Year:   . Arboriculturist in the Last Year:   Transportation Needs:   . Film/video editor (Medical):   Marland Kitchen Lack of Transportation (Non-Medical):   Physical Activity:   . Days of Exercise per Week:   . Minutes of Exercise per Session:   Stress:   . Feeling of Stress :   Social Connections:   . Frequency of Communication with Friends and Family:   . Frequency of Social Gatherings with Friends and Family:   . Attends Religious Services:   . Active Member of Clubs or Organizations:   . Attends Archivist Meetings:   Marland Kitchen Marital Status:     Allergies: No Known Allergies  Metabolic Disorder Labs: Lab Results  Component Value Date   HGBA1C 5.0 06/30/2015   No results found for: PROLACTIN Lab Results  Component Value Date   CHOL 211 (H) 07/16/2018   TRIG 89.0 07/16/2018   HDL 47.90 07/16/2018   CHOLHDL 4 07/16/2018   VLDL 17.8 07/16/2018   LDLCALC 145 (H) 07/16/2018   LDLCALC 103 (H) 06/30/2015   Lab Results  Component Value Date   TSH 1.18 10/15/2018   TSH 1.270 06/30/2015    Therapeutic Level Labs: No results found for: LITHIUM No results found for: VALPROATE No components found for:  CBMZ  Current Medications: Current Outpatient Medications  Medication Sig Dispense Refill  . albuterol (VENTOLIN HFA) 108 (90 Base) MCG/ACT inhaler Inhale 2 puffs into the lungs every 6 (six) hours as needed for wheezing or shortness of breath. 18 g 0  .  neomycin-polymyxin b-dexamethasone (MAXITROL) 3.5-10000-0.1 SUSP Place 2 drops into the left eye every 6 (six) hours. 5 mL 0  . nortriptyline (PAMELOR) 25 MG capsule Take 1 capsule (25 mg total) by mouth at bedtime. 30 capsule 2  . ondansetron (ZOFRAN) 4 MG tablet Take 1 tablet (4 mg total) by mouth every 8 (eight) hours as needed. 20 tablet 0  . Oxcarbazepine (TRILEPTAL) 300 MG tablet TAKE 3 TABLETS BY MOUTH EVERY MORNING AND 3 TABLETS BY MOUTH AT BEDTIME 180 tablet 2  . SUMAtriptan (IMITREX) 25 MG tablet Take 1 tablet (25 mg total) by mouth every 2 (two) hours as needed for migraine. May repeat in 2 hours if headache persists or recurs. 10 tablet 2  .  risperiDONE (RISPERDAL) 1 MG tablet Take 1 tablet (1 mg total) by mouth at bedtime. 30 tablet 1   No current facility-administered medications for this visit.     Musculoskeletal: Strength & Muscle Tone: UTA Gait & Station: normal Patient leans: N/A  Psychiatric Specialty Exam: Review of Systems  Psychiatric/Behavioral: Positive for dysphoric mood.  All other systems reviewed and are negative.   There were no vitals taken for this visit.There is no height or weight on file to calculate BMI.  General Appearance: Casual  Eye Contact:  Good  Speech:  Clear and Coherent  Volume:  Normal  Mood:  Depressed and Irritable  Affect:  Congruent  Thought Process:  Goal Directed and Descriptions of Associations: Intact  Orientation:  Full (Time, Place, and Person)  Thought Content: Logical   Suicidal Thoughts:  No  Homicidal Thoughts:  No  Memory:  Immediate;   Fair Recent;   Fair Remote;   Fair  Judgement:  Fair  Insight:  Fair  Psychomotor Activity:  Normal  Concentration:  Concentration: Fair and Attention Span: Fair  Recall:  [employer] of Knowledge: Fair  Language: Fair  Akathisia:  No  Handed:  Right  AIMS (if indicated): UTA  Assets:  Communication Skills Desire for Improvement Housing Social Support  ADL's:  Intact   Cognition: WNL  Sleep:  Fair   Screenings: GAD-7     Office Visit from 07/08/2018 in [facility]  Total GAD-7 Score  8    PHQ2-9     Office Visit from 07/08/2018 in [facility] at [facility] Total Score  3  PHQ-9 Total Score  15       Assessment and Plan: AYLEE LITTRELL is a 29 year old Caucasian female, married, employed, lives in [place], has a history of bipolar disorder, panic attacks, tobacco use disorder was evaluated by telemedicine today.  She is biologically predisposed given her history of trauma and family history.  Patient with psychosocial stressors of the pandemic and relationship struggles.  Patient is currently struggling with mood lability, irritability and depressive symptoms.  Patient will benefit from medication readjustment and psychotherapy sessions.  Plan as noted below.  Plan Bipolar disorder type type II depressed-unstable Increase risperidone to 1 mg p.o. nightly Trileptal 900 mg p.o. twice daily Celexa 20 mg p.o. daily  Panic attacks-stable Celexa as prescribed   High risk medication use-we will order the following labs, Trileptal level, CBC, CMP. Pending EKG to monitor QTC-patient agrees to get it done.  Patient was referred for therapy however has been noncompliant.  Patient advised to start psychotherapy session with EAP.   Follow-up in clinic in 2 to 3 weeks or sooner if needed.  I have spent atleast 20 minutes non face to face with patient today. More than 50 % of the time was spent for preparing to see the patient ( e.g., review of test, records ),ordering medications and test ,psychoeducation and supportive psychotherapy and care coordination,as well as documenting clinical information in electronic health record. This note was generated in part or whole with voice recognition software. Voice recognition is usually quite accurate but there are transcription errors that can and very often do occur. I apologize  for any typographical errors that were not detected and corrected.       Jomarie Longs, MD 06/24/2019, 5:09 PM

## 2019-06-24 NOTE — Telephone Encounter (Signed)
labwork mailed 

## 2019-07-03 ENCOUNTER — Encounter: Payer: Self-pay | Admitting: Internal Medicine

## 2019-07-06 ENCOUNTER — Ambulatory Visit: Payer: 59 | Admitting: Internal Medicine

## 2019-07-08 ENCOUNTER — Ambulatory Visit (INDEPENDENT_AMBULATORY_CARE_PROVIDER_SITE_OTHER): Payer: 59 | Admitting: Internal Medicine

## 2019-07-08 ENCOUNTER — Other Ambulatory Visit: Payer: Self-pay

## 2019-07-08 ENCOUNTER — Encounter: Payer: Self-pay | Admitting: Internal Medicine

## 2019-07-08 VITALS — BP 130/84 | HR 125 | Temp 98.5°F | Wt 249.0 lb

## 2019-07-08 DIAGNOSIS — R0602 Shortness of breath: Secondary | ICD-10-CM

## 2019-07-08 DIAGNOSIS — R002 Palpitations: Secondary | ICD-10-CM

## 2019-07-08 DIAGNOSIS — R0789 Other chest pain: Secondary | ICD-10-CM

## 2019-07-08 NOTE — Patient Instructions (Signed)
Palpitations Palpitations are feelings that your heartbeat is not normal. Your heartbeat may feel like it is:  Uneven.  Faster than normal.  Fluttering.  Skipping a beat. This is usually not a serious problem. In some cases, you may need tests to rule out any serious problems. Follow these instructions at home: Pay attention to any changes in your condition. Take these actions to help manage your symptoms: Eating and drinking  Avoid: ? Coffee, tea, soft drinks, and energy drinks. ? Chocolate. ? Alcohol. ? Diet pills. Lifestyle   Try to lower your stress. These things can help you relax: ? Yoga. ? Deep breathing and meditation. ? Exercise. ? Using words and images to create positive thoughts (guided imagery). ? Using your mind to control things in your body (biofeedback).  Do not use drugs.  Get plenty of rest and sleep. Keep a regular bed time. General instructions   Take over-the-counter and prescription medicines only as told by your doctor.  Do not use any products that contain nicotine or tobacco, such as cigarettes and e-cigarettes. If you need help quitting, ask your doctor.  Keep all follow-up visits as told by your doctor. This is important. You may need more tests if palpitations do not go away or get worse. Contact a doctor if:  Your symptoms last more than 24 hours.  Your symptoms occur more often. Get help right away if you:  Have chest pain.  Feel Solarz of breath.  Have a very bad headache.  Feel dizzy.  Pass out (faint). Summary  Palpitations are feelings that your heartbeat is uneven or faster than normal. It may feel like your heart is fluttering or skipping a beat.  Avoid food and drinks that may cause palpitations. These include caffeine, chocolate, and alcohol.  Try to lower your stress. Do not smoke or use drugs.  Get help right away if you faint or have chest pain, shortness of breath, a severe headache, or dizziness. This  information is not intended to replace advice given to you by your health care provider. Make sure you discuss any questions you have with your health care provider. Document Revised: 02/27/2017 Document Reviewed: 02/27/2017 Elsevier Patient Education  2020 Elsevier Inc.  

## 2019-07-08 NOTE — Progress Notes (Signed)
Subjective:    Patient ID: Jill Sandoval, female    DOB: [birth year], 29 y.o.   MRN: [id number]  HPI  Pt presents to the clinic today with c/o palpitations. She reports this started 1 week ago. She reports the palpitations typically last 5-10 minutes but has had this episode for at least an hour. She reports her HR got up into the 150's. She reports associated SOB and chest tightness but denies dizziness, visual changes , near syncope, chest pain, nausea or vomiting. She is under a lot of stress. She reports she recently started an increased dose of Risperidol about the same time these symptoms started. She has had some caffeine daily. She has not taken anything OTC for her symptoms.  Review of Systems      Past Medical History:  Diagnosis Date  . Anxiety and depression   . Asthma    inhaler prn  . Fetal demise > 22 weeks, delivered, current hospitalization 11/04/2015  . Miscarriage     Current Outpatient Medications  Medication Sig Dispense Refill  . albuterol (VENTOLIN HFA) 108 (90 Base) MCG/ACT inhaler Inhale 2 puffs into the lungs every 6 (six) hours as needed for wheezing or shortness of breath. 18 g 0  . neomycin-polymyxin b-dexamethasone (MAXITROL) 3.5-10000-0.1 SUSP Place 2 drops into the left eye every 6 (six) hours. 5 mL 0  . nortriptyline (PAMELOR) 25 MG capsule Take 1 capsule (25 mg total) by mouth at bedtime. 30 capsule 2  . ondansetron (ZOFRAN) 4 MG tablet Take 1 tablet (4 mg total) by mouth every 8 (eight) hours as needed. 20 tablet 0  . Oxcarbazepine (TRILEPTAL) 300 MG tablet TAKE 3 TABLETS BY MOUTH EVERY MORNING AND 3 TABLETS BY MOUTH AT BEDTIME 180 tablet 2  . risperiDONE (RISPERDAL) 1 MG tablet Take 1 tablet (1 mg total) by mouth at bedtime. 30 tablet 1  . SUMAtriptan (IMITREX) 25 MG tablet Take 1 tablet (25 mg total) by mouth every 2 (two) hours as needed for migraine. May repeat in 2 hours if headache persists or recurs. 10 tablet 2   No current  facility-administered medications for this visit.    No Known Allergies  Family History  Problem Relation Age of Onset  . Hypertension Maternal Grandmother   . Prostate cancer Maternal Grandfather   . Hearing loss Brother   . Hearing loss Paternal Grandfather   . Hypertension Father   . Other Neg Hx     Social History   Socioeconomic History  . Marital status: Married    Spouse name: Not on file  . Number of children: 2  . Years of education: Not on file  . Highest education level: Not on file  Occupational History  . Not on file  Tobacco Use  . Smoking status: Former Smoker    Packs/day: 0.25    Types: Cigarettes    Quit date: 11/14/2018    Years since quitting: 0.6  . Smokeless tobacco: Never Used  Substance and Sexual Activity  . Alcohol use: No  . Drug use: No  . Sexual activity: Yes  Other Topics Concern  . Not on file  Social History Narrative   Married.   2 children.   Works at [employer].   Enjoys camping, being outdoors.    Social Determinants of Health   Financial Resource Strain:   . Difficulty of Paying Living Expenses:   Food Insecurity:   . Worried About Programme researcher, broadcasting/film/video in the Last Year:   .  Ran Out of Food in the Last Year:   Transportation Needs:   . Film/video editor (Medical):   Marland Kitchen Lack of Transportation (Non-Medical):   Physical Activity:   . Days of Exercise per Week:   . Minutes of Exercise per Session:   Stress:   . Feeling of Stress :   Social Connections:   . Frequency of Communication with Friends and Family:   . Frequency of Social Gatherings with Friends and Family:   . Attends Religious Services:   . Active Member of Clubs or Organizations:   . Attends Archivist Meetings:   Marland Kitchen Marital Status:   Intimate Partner Violence:   . Fear of Current or Ex-Partner:   . Emotionally Abused:   Marland Kitchen Physically Abused:   . Sexually Abused:      Constitutional: Denies fever, malaise, fatigue, headache or abrupt weight  changes.  HEENT: Denies eye pain, eye redness, ear pain, ringing in the ears, wax buildup, runny nose, nasal congestion, bloody nose, or sore throat. Respiratory: Pt reports shortness of breath. Denies difficulty breathing, cough or sputum production.   Cardiovascular: Pt reports chest tightness, palpitations. Denies chest pain, chest tightness, or swelling in the hands or feet.  Gastrointestinal: Denies abdominal pain, bloating, constipation, diarrhea or blood in the stool.  Neurological: Denies dizziness, difficulty with memory, difficulty with speech or problems with balance and coordination.  Psych: Pt has a history of anxiety and depression. Denies SI/HI.  No other specific complaints in a complete review of systems (except as listed in HPI above).  Objective:   Physical Exam  BP 130/84   Pulse (!) 125   Temp 98.5 F (36.9 C) (Temporal)   Wt 249 lb (112.9 kg)   SpO2 98%   BMI 42.74 kg/m  Wt Readings from Last 3 Encounters:  07/08/19 249 lb (112.9 kg)  06/22/19 249 lb 3 oz (113 kg)  04/24/19 248 lb (112.5 kg)    General: Appears her stated age, obese, in NAD. Neck:   No masses, lumps or thyromegaly present.  Cardiovascular: Tachycardic with normal rhythm. S1,S2 noted.  No murmur, rubs or gallops noted.  Pulmonary/Chest: Normal effort and positive vesicular breath sounds. No respiratory distress. No wheezes, rales or ronchi noted.  Neurological: Alert and oriented.  Psychiatric: Mood and affect normal. Behavior is normal. Judgment and thought content normal.     BMET    Component Value Date/Time   NA 139 10/14/2018 1421   NA 136 06/30/2015 0000   NA 137 07/28/2011 1558   K 4.1 10/14/2018 1421   K 3.7 07/28/2011 1558   CL 105 10/14/2018 1421   CL 107 07/28/2011 1558   CO2 29 10/14/2018 1421   CO2 22 07/28/2011 1558   GLUCOSE 81 10/14/2018 1421   GLUCOSE 76 07/28/2011 1558   BUN 17 10/14/2018 1421   BUN 9 06/30/2015 0000   BUN 11 07/28/2011 1558   CREATININE 0.85  10/14/2018 1421   CREATININE 0.93 07/28/2011 1558   CALCIUM 9.3 10/14/2018 1421   CALCIUM 9.1 07/28/2011 1558   GFRNONAA 118 06/30/2015 0000   GFRNONAA >60 07/28/2011 1558   GFRAA 136 06/30/2015 0000   GFRAA >60 07/28/2011 1558    Lipid Panel     Component Value Date/Time   CHOL 211 (H) 07/16/2018 1036   CHOL 194 06/30/2015 0000   TRIG 89.0 07/16/2018 1036   HDL 47.90 07/16/2018 1036   HDL 57 06/30/2015 0000   CHOLHDL 4 07/16/2018 1036  VLDL 17.8 07/16/2018 1036   LDLCALC 145 (H) 07/16/2018 1036   LDLCALC 103 (H) 06/30/2015 0000    CBC    Component Value Date/Time   WBC 9.5 10/14/2018 1421   RBC 4.66 10/14/2018 1421   HGB 13.2 10/14/2018 1421   HGB 12.6 06/30/2015 0000   HCT 40.3 10/14/2018 1421   HCT 38.1 06/30/2015 0000   PLT 291.0 10/14/2018 1421   PLT 320 06/30/2015 0000   MCV 86.4 10/14/2018 1421   MCV 84 06/30/2015 0000   MCV 89 07/28/2011 1558   MCH 27.9 09/25/2016 0454   MCHC 32.9 10/14/2018 1421   RDW 13.2 10/14/2018 1421   RDW 13.9 06/30/2015 0000   RDW 13.3 07/28/2011 1558   LYMPHSABS 2.4 06/30/2015 0000   EOSABS 0.2 06/30/2015 0000   BASOSABS 0.0 06/30/2015 0000    Hgb A1C Lab Results  Component Value Date   HGBA1C 5.0 06/30/2015            Assessment & Plan:   Palpitations, Chest Tightness, SOB:  Indication for ECG: Palpitations Interpretation of ECG: tachycardic, possible aflutter Comparison of ECG: None Will check TSH, Mg Referral to cardiology for further evaluation  Will follow up after labs, return precautions discussed Nicki Reaper, NP This visit occurred during the SARS-CoV-2 public health emergency.  Safety protocols were in place, including screening questions prior to the visit, additional usage of staff PPE, and extensive cleaning of exam room while observing appropriate contact time as indicated for disinfecting solutions.

## 2019-07-09 ENCOUNTER — Ambulatory Visit: Payer: 59 | Admitting: Internal Medicine

## 2019-07-09 LAB — TSH: TSH: 1.42 u[IU]/mL (ref 0.35–4.50)

## 2019-07-09 LAB — MAGNESIUM: Magnesium: 1.8 mg/dL (ref 1.5–2.5)

## 2019-07-16 ENCOUNTER — Telehealth: Payer: Self-pay

## 2019-07-16 ENCOUNTER — Telehealth (INDEPENDENT_AMBULATORY_CARE_PROVIDER_SITE_OTHER): Payer: 59 | Admitting: Psychiatry

## 2019-07-16 ENCOUNTER — Encounter: Payer: Self-pay | Admitting: Psychiatry

## 2019-07-16 ENCOUNTER — Other Ambulatory Visit: Payer: Self-pay

## 2019-07-16 DIAGNOSIS — F3175 Bipolar disorder, in partial remission, most recent episode depressed: Secondary | ICD-10-CM | POA: Diagnosis not present

## 2019-07-16 DIAGNOSIS — F410 Panic disorder [episodic paroxysmal anxiety] without agoraphobia: Secondary | ICD-10-CM | POA: Diagnosis not present

## 2019-07-16 DIAGNOSIS — Z79899 Other long term (current) drug therapy: Secondary | ICD-10-CM | POA: Diagnosis not present

## 2019-07-16 NOTE — Progress Notes (Signed)
Provider Location : [facility] Patient Location : [facility]  Virtual Visit via Video Note  I connected with Jill Sandoval on 07/16/19 at 10:00 AM EDT by a video enabled telemedicine application and verified that I am speaking with the correct person using two identifiers.   I discussed the limitations of evaluation and management by telemedicine and the availability of in person appointments. The patient expressed understanding and agreed to proceed.   I discussed the assessment and treatment plan with the patient. The patient was provided an opportunity to ask questions and all were answered. The patient agreed with the plan and demonstrated an understanding of the instructions.   The patient was advised to call back or seek an in-person evaluation if the symptoms worsen or if the condition fails to improve as anticipated.  BH MD OP Progress Note  07/16/2019 4:25 PM Jill Sandoval  MRN:  [id number]  Chief Complaint:  Chief Complaint    Follow-up     HPI: Jill Sandoval is a 29 year old Caucasian female, married, employed, currently lives in [place], has a history of bipolar disorder type II, asthma, tobacco use disorder, panic attacks was evaluated by telemedicine today.  Patient today reports she is currently making progress with regards to her mood swings.  She however reports she continues to have panic symptoms.  She reports anxiety attacks when she has shortness of breath and feels overwhelmed.  The last time it happened was a week ago.  She tried to deep breathe and relax and that helped.  She was recently taken off of the Celexa and started on Pamelor which may have triggered this episode.  Patient reports the current regimen of her medications as helpful.  She denies side effects.  He reports sleep is good.  Patient however had EKG done on 07/08/2019-she has been referred to cardiology per primary care provider.  She reports she has upcoming appointment in July.  Patient also  has not started psychotherapy sessions as discussed last visit.  She reports she had a lot going on and hence has not been able to schedule that appointment.  Patient denies any suicidality, homicidality or perceptual disturbances.  Visit Diagnosis:    ICD-10-CM   1. Bipolar disorder, in partial remission, most recent episode depressed (HCC)  F31.75    type 2  2. Panic attacks  F41.0   3. High risk medication use  Z79.899     Past Psychiatric History: I have reviewed past psychiatric history from my progress note on 10/15/2018.  Past trials of Celexa, Trileptal  Past Medical History:  Past Medical History:  Diagnosis Date  . Anxiety and depression   . Asthma    inhaler prn  . Fetal demise > 22 weeks, delivered, current hospitalization 11/04/2015  . Miscarriage     Past Surgical History:  Procedure Laterality Date  . CESAREAN SECTION N/A 03/24/2012   Procedure: CESAREAN SECTION;  Surgeon: Mickel Baas, MD;  Location: [facility];  Service: Obstetrics;  Laterality: N/A;  . CESAREAN SECTION N/A 09/24/2016   Procedure: CESAREAN SECTION;  Surgeon: Waynard Reeds, MD;  Location: [facility];  Service: Obstetrics;  Laterality: N/A;    Family Psychiatric History: I have reviewed family psychiatric history from my progress note on 10/15/2018.  Family History:  Family History  Problem Relation Age of Onset  . Hypertension Maternal Grandmother   . Prostate cancer Maternal Grandfather   . Hearing loss Brother   . Hearing loss Paternal Grandfather   .  Hypertension Father   . Other Neg Hx     Social History: I have reviewed social history from my progress note on 10/15/2018. Social History   Socioeconomic History  . Marital status: Married    Spouse name: Not on file  . Number of children: 2  . Years of education: Not on file  . Highest education level: Not on file  Occupational History  . Not on file  Tobacco Use  . Smoking status: Former Smoker    Packs/day: 0.25    Types:  Cigarettes    Quit date: 11/14/2018    Years since quitting: 0.6  . Smokeless tobacco: Never Used  Substance and Sexual Activity  . Alcohol use: No  . Drug use: No  . Sexual activity: Yes  Other Topics Concern  . Not on file  Social History Narrative   Married.   2 children.   Works at [employer].   Enjoys camping, being outdoors.    Social Determinants of Health   Financial Resource Strain:   . Difficulty of Paying Living Expenses:   Food Insecurity:   . Worried About Charity fundraiser in the Last Year:   . Arboriculturist in the Last Year:   Transportation Needs:   . Film/video editor (Medical):   Marland Kitchen Lack of Transportation (Non-Medical):   Physical Activity:   . Days of Exercise per Week:   . Minutes of Exercise per Session:   Stress:   . Feeling of Stress :   Social Connections:   . Frequency of Communication with Friends and Family:   . Frequency of Social Gatherings with Friends and Family:   . Attends Religious Services:   . Active Member of Clubs or Organizations:   . Attends Archivist Meetings:   Marland Kitchen Marital Status:     Allergies: No Known Allergies  Metabolic Disorder Labs: Lab Results  Component Value Date   HGBA1C 5.0 06/30/2015   No results found for: PROLACTIN Lab Results  Component Value Date   CHOL 211 (H) 07/16/2018   TRIG 89.0 07/16/2018   HDL 47.90 07/16/2018   CHOLHDL 4 07/16/2018   VLDL 17.8 07/16/2018   LDLCALC 145 (H) 07/16/2018   LDLCALC 103 (H) 06/30/2015   Lab Results  Component Value Date   TSH 1.42 07/08/2019   TSH 1.18 10/15/2018    Therapeutic Level Labs: No results found for: LITHIUM No results found for: VALPROATE No components found for:  CBMZ  Current Medications: Current Outpatient Medications  Medication Sig Dispense Refill  . albuterol (VENTOLIN HFA) 108 (90 Base) MCG/ACT inhaler Inhale 2 puffs into the lungs every 6 (six) hours as needed for wheezing or shortness of breath. 18 g 0  .  dexamethasone (DECADRON) 0.1 % ophthalmic solution     . GENTAK 0.3 % ophthalmic ointment     . neomycin-polymyxin b-dexamethasone (MAXITROL) 3.5-10000-0.1 SUSP Place 2 drops into the left eye every 6 (six) hours. 5 mL 0  . neomycin-polymyxin-hydrocortisone (CORTISPORIN) 3.5-10000-1 ophthalmic suspension     . nortriptyline (PAMELOR) 25 MG capsule Take 1 capsule (25 mg total) by mouth at bedtime. 30 capsule 2  . ondansetron (ZOFRAN) 4 MG tablet Take 1 tablet (4 mg total) by mouth every 8 (eight) hours as needed. 20 tablet 0  . Oxcarbazepine (TRILEPTAL) 300 MG tablet TAKE 3 TABLETS BY MOUTH EVERY MORNING AND 3 TABLETS BY MOUTH AT BEDTIME 180 tablet 2  . risperiDONE (RISPERDAL) 1 MG tablet Take 1 tablet (  1 mg total) by mouth at bedtime. 30 tablet 1  . SUMAtriptan (IMITREX) 25 MG tablet Take 1 tablet (25 mg total) by mouth every 2 (two) hours as needed for migraine. May repeat in 2 hours if headache persists or recurs. 10 tablet 2   No current facility-administered medications for this visit.     Musculoskeletal: Strength & Muscle Tone: UTA Gait & Station: normal Patient leans: N/A  Psychiatric Specialty Exam: Review of Systems  Psychiatric/Behavioral: The patient is nervous/anxious.   All other systems reviewed and are negative.   There were no vitals taken for this visit.There is no height or weight on file to calculate BMI.  General Appearance: Casual  Eye Contact:  Fair  Speech:  Normal Rate  Volume:  Normal  Mood:  Anxious  Affect:  Congruent  Thought Process:  Goal Directed and Descriptions of Associations: Intact  Orientation:  Full (Time, Place, and Person)  Thought Content: Logical   Suicidal Thoughts:  No  Homicidal Thoughts:  No  Memory:  Immediate;   Fair Recent;   Fair Remote;   Fair  Judgement:  Fair  Insight:  Fair  Psychomotor Activity:  Normal  Concentration:  Concentration: Fair and Attention Span: Fair  Recall:  [employer] of Knowledge: Fair  Language:  Fair  Akathisia:  No  Handed:  Right  AIMS (if indicated): UTA  Assets:  Communication Skills Desire for Improvement Housing Social Support  ADL's:  Intact  Cognition: WNL  Sleep:  Fair   Screenings: GAD-7     Office Visit from 07/08/2018 in [facility]  Total GAD-7 Score 8    PHQ2-9     Office Visit from 07/08/2018 in [facility] Total Score 3  PHQ-9 Total Score 15       Assessment and Plan: ATARAH CADOGAN is a 29 year old Caucasian female, married, employed, lives in [place], has a history of panic attacks, bipolar disorder, tobacco use disorder was evaluated by telemedicine today.  She is biologically predisposed given her history of trauma, family history.  Patient with psychosocial stressors of the pandemic and relationship struggles.  Patient is currently struggling with anxiety attacks.  Plan as noted below.  Plan Bipolar disorder type II depressed-improving Risperidone 1 mg p.o. nightly Trileptal 900 mg p.o. twice daily Discontinue Celexa for noncompliance.  She reports it was stopped by her primary care provider and she was started on Pamelor.  Panic attacks-unstable We will continue Pamelor 25 mg p.o. nightly which was recently added for migraine headaches by her primary care provider.  Will not increase the dosage yet since she had recent EKG changes and is currently awaiting cardiology evaluation. Discussed referral for CBT-she will reach out to her EAP.  Also discussed to possibly schedule an appointment with therapist here in our office. Patient was previously referred for therapy however has been noncompliant.  Discussed with patient to sign a release so we can obtain medical records from cardiologist.  Further medication changes will be done after cardiology clearance.  This was discussed with patient and she agrees with plan.   Patient also reports that she did not receive any labs that were mailed to her last  visit including Trileptal level, CBC, CMP.  Discussed with her that labs can be faxed to [facility] lab and she needs to get it done as soon as possible.  I have spent atleast 20 minutes non face to face with patient today. More  than 50 % of the time was spent for preparing to see the patient ( e.g., review of test, records ), obtaining and to review and separately obtained history , ordering medications and test ,psychoeducation and supportive psychotherapy and care coordination,as well as documenting clinical information in electronic health record,interpreting res This note was generated in part or whole with voice recognition software. Voice recognition is usually quite accurate but there are transcription errors that can and very often do occur. I apologize for any typographical errors that were not detected and corrected.      Jill Longs, MD 07/16/2019, 4:25 PM

## 2019-07-16 NOTE — Telephone Encounter (Signed)
Lab work orders faxed and confirmed

## 2019-07-16 NOTE — Telephone Encounter (Signed)
I just did, in your mailbox on your door.

## 2019-07-16 NOTE — Telephone Encounter (Signed)
pt called left message that she did not received her labwork orders. can you reprint and sign.

## 2019-07-27 ENCOUNTER — Ambulatory Visit: Payer: 59 | Admitting: Cardiology

## 2019-07-31 ENCOUNTER — Encounter: Payer: 59 | Admitting: Internal Medicine

## 2019-08-04 ENCOUNTER — Other Ambulatory Visit: Payer: Self-pay | Admitting: Internal Medicine

## 2019-08-04 ENCOUNTER — Other Ambulatory Visit: Payer: Self-pay

## 2019-08-04 ENCOUNTER — Ambulatory Visit: Payer: 59 | Admitting: Licensed Clinical Social Worker

## 2019-08-05 NOTE — Telephone Encounter (Signed)
Please advise if okay to continue

## 2019-08-10 ENCOUNTER — Other Ambulatory Visit: Payer: Self-pay | Admitting: Psychiatry

## 2019-08-10 DIAGNOSIS — F3177 Bipolar disorder, in partial remission, most recent episode mixed: Secondary | ICD-10-CM

## 2019-08-12 ENCOUNTER — Other Ambulatory Visit: Payer: Self-pay

## 2019-08-12 ENCOUNTER — Ambulatory Visit (INDEPENDENT_AMBULATORY_CARE_PROVIDER_SITE_OTHER): Payer: 59 | Admitting: Licensed Clinical Social Worker

## 2019-08-12 DIAGNOSIS — F3175 Bipolar disorder, in partial remission, most recent episode depressed: Secondary | ICD-10-CM
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
## 2019-08-17 ENCOUNTER — Other Ambulatory Visit: Payer: Self-pay

## 2019-08-17 ENCOUNTER — Ambulatory Visit (INDEPENDENT_AMBULATORY_CARE_PROVIDER_SITE_OTHER): Payer: 59 | Admitting: Cardiology

## 2019-08-17 ENCOUNTER — Encounter: Payer: Self-pay | Admitting: Cardiology

## 2019-08-17 VITALS — BP 120/80 | HR 103 | Ht 63.0 in | Wt 252.1 lb

## 2019-08-17 DIAGNOSIS — R002 Palpitations: Secondary | ICD-10-CM | POA: Diagnosis not present

## 2019-08-17 MED ORDER — METOPROLOL SUCCINATE ER 50 MG PO TB24
25.0000 mg | ORAL_TABLET | Freq: Every day | ORAL | 5 refills | Status: DC
Start: 2019-08-17 — End: 2019-09-07

## 2019-08-17 NOTE — Progress Notes (Signed)
Cardiology Office Note:    Date:  08/17/2019   ID:  Eleonore Chiquito, DOB [birth year], MRN [id number]  PCP:  Lorre Munroe, NP  [facility] Cardiologist:  Debbe Odea, MD  [facility] Electrophysiologist:  None   Referring MD: Lorre Munroe, NP   Chief Complaint  Patient presents with  . New Patient (Initial Visit)    Ref by Nicki Reaper for palpitations. Meds reviewed by the pt. verbally. Pt. c/o palpitations and shortness of breath mostly when is stressed out.    CYNETHIA SCHINDLER is a 29 y.o. female who is being seen today for the evaluation of palpitations at the request of Lorre Munroe, NP.   History of Present Illness:    ZOWIE LUNDAHL is a 29 y.o. female with a hx of anxiety, depression, migraines, asthma who presents due to palpitations.  Patient states having symptoms of palpitations over the past 2 months.  Symptoms typically occur whenever she is anxious or stressed.  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  She feels stressed at work.  This prompted her to start working from home which have improved her symptoms.  She saw her primary care provider on 07/08/2019 where a ECG was obtained, showing heart rates of 118, atrial flutter noted on EKG read.  She denies any history of heart disease.  She otherwise feels well whenever she is not stressed or anxious.  Past Medical History:  Diagnosis Date  . Anxiety and depression   . Asthma    inhaler prn  . Fetal demise > 22 weeks, delivered, current hospitalization 11/04/2015  . Miscarriage     Past Surgical History:  Procedure Laterality Date  . CESAREAN SECTION N/A 03/24/2012   Procedure: CESAREAN SECTION;  Surgeon: Mickel Baas, MD;  Location: [facility];  Service: Obstetrics;  Laterality: N/A;  . CESAREAN SECTION N/A 09/24/2016   Procedure: CESAREAN SECTION;  Surgeon: Waynard Reeds, MD;  Location: [facility]ING SU[facility];  Service: Obstetrics;  Laterality: N/A;     Current Medications: Current Meds  Medication Sig  . albuterol (VENTOLIN HFA) 108 (90 Base) MCG/ACT inhaler Inhale 2 puffs into the lungs every 6 (six) hours as needed for wheezing or shortness of breath.  . nortriptyline (PAMELOR) 25 MG capsule TAKE 1 CAPSULE BY MOUTH AT BEDTIME.  Marland Kitchen ondansetron (ZOFRAN) 4 MG tablet Take 1 tablet (4 mg total) by mouth every 8 (eight) hours as needed.  . Oxcarbazepine (TRILEPTAL) 300 MG tablet TAKE 3 TABLETS BY MOUTH EVERY MORNING AND 3 TABLETS BY MOUTH AT BEDTIME  . risperiDONE (RISPERDAL) 1 MG tablet Take 1 tablet (1 mg total) by mouth at bedtime.  . SUMAtriptan (IMITREX) 25 MG tablet Take 1 tablet (25 mg total) by mouth every 2 (two) hours as needed for migraine. May repeat in 2 hours if headache persists or recurs.     Allergies:   Patient has no known allergies.   Social History   Socioeconomic History  . Marital status: Married    Spouse name: Not on file  . Number of children: 2  . Years of education: Not on file  . Highest education level: Not on file  Occupational History  . Not on file  Tobacco Use  . Smoking status: Former Smoker    Packs/day: 0.25    Types: Cigarettes    Quit date: 11/14/2018    Years since quitting: 0.7  . Smokeless tobacco: Never Used  Vaping Use  . Vaping Use: Some days  Substance and Sexual Activity  . Alcohol use: No  . Drug use: No  . Sexual activity: Yes  Other Topics Concern  . Not on file  Social History Narrative   Married.   2 children.   Works at [employer].   Enjoys camping, being outdoors.    Social Determinants of Health   Financial Resource Strain:   . Difficulty of Paying Living Expenses:   Food Insecurity:   . Worried About Programme researcher, broadcasting/film/video in the Last Year:   . Barista in the Last Year:   Transportation Needs:   . Freight forwarder (Medical):   Marland Kitchen Lack of Transportation (Non-Medical):   Physical Activity:   . Days of Exercise per Week:   . Minutes of Exercise per  Session:   Stress:   . Feeling of Stress :   Social Connections:   . Frequency of Communication with Friends and Family:   . Frequency of Social Gatherings with Friends and Family:   . Attends Religious Services:   . Active Member of Clubs or Organizations:   . Attends Banker Meetings:   Marland Kitchen Marital Status:      Family History: The patient's family history includes Hearing loss in her brother and paternal grandfather; Hypertension in her father and maternal grandmother; Prostate cancer in her maternal grandfather. There is no history of Other.  ROS:   Please see the history of present illness.     All other systems reviewed and are negative.  EKGs/Labs/Other Studies Reviewed:    The following studies were reviewed today:   EKG:  EKG is  ordered today.  The ekg ordered today demonstrates sinus tachycardia, heart rate 103  Recent Labs: 10/14/2018: BUN 17; Creatinine, Ser 0.85; Hemoglobin 13.2; Platelets 291.0; Potassium 4.1; Sodium 139 07/08/2019: Magnesium 1.8; TSH 1.42  Recent Lipid Panel    Component Value Date/Time   CHOL 211 (H) 07/16/2018 1036   CHOL 194 06/30/2015 0000   TRIG 89.0 07/16/2018 1036   HDL 47.90 07/16/2018 1036   HDL 57 06/30/2015 0000   CHOLHDL 4 07/16/2018 1036   VLDL 17.8 07/16/2018 1036   LDLCALC 145 (H) 07/16/2018 1036   LDLCALC 103 (H) 06/30/2015 0000    Physical Exam:    VS:  BP 120/80 (BP Location: Right Arm, Patient Position: Sitting, Cuff Size: Normal)   Pulse (!) 103   Ht 5\' 3"  (1.6 m)   Wt 252 lb 2 oz (114.4 kg)   SpO2 98%   BMI 44.66 kg/m     Wt Readings from Last 3 Encounters:  08/17/19 252 lb 2 oz (114.4 kg)  07/08/19 249 lb (112.9 kg)  06/22/19 249 lb 3 oz (113 kg)     GEN:  Well nourished, well developed in no acute distress HEENT: Normal NECK: No JVD; No carotid bruits LYMPHATICS: No lymphadenopathy CARDIAC: RRR, no murmurs, rubs, gallops RESPIRATORY:  Clear to auscultation without rales, wheezing or rhonchi   ABDOMEN: Soft, non-tender, non-distended MUSCULOSKELETAL:  No edema; No deformity  SKIN: Warm and dry NEUROLOGIC:  Alert and oriented x 3 PSYCHIATRIC:  Normal affect   ASSESSMENT:    1. Palpitations   2. Morbid obesity (HCC)    PLAN:    In order of problems listed above:  1. Patient with history of palpitations associated with anxiety.  EKGs at the PCP office noted to be atrial flutter, reviewed by myself which showed sinus tachycardia  heart rate 118, not atrial flutter.  EKG in the office today shows sinus tachycardia heart rate 103.  Her symptoms are consistent with anxiety.  She is young with no heart disease history.  We will start Toprol-XL 25 mg daily due to inappropriate sinus tachycardia.  2. Low calorie diet, weight loss recommended.  Follow-up in 1 month.  This note was generated in part or whole with voice recognition software. Voice recognition is usually quite accurate but there are transcription errors that can and very often do occur. I apologize for any typographical errors that were not detected and corrected.  Medication Adjustments/Labs and Tests Ordered: Current medicines are reviewed at length with the patient today.  Concerns regarding medicines are outlined above.  Orders Placed This Encounter  Procedures  . EKG 12-Lead   Meds ordered this encounter  Medications  . metoprolol succinate (TOPROL-XL) 50 MG 24 hr tablet    Sig: Take 0.5 tablets (25 mg total) by mouth daily. Take with or immediately following a meal.    Dispense:  15 tablet    Refill:  5    Patient Instructions  Medication Instructions:   Your physician has recommended you make the following change in your medication:   START taking Toprol XL:  Take 0.5 tablets (25 mg total) by mouth daily  *If you need a refill on your cardiac medications before your next appointment, please call your pharmacy*   Lab Work: None Ordered If you have labs (blood work) drawn today and your tests are  completely normal, you will receive your results only by: Marland Kitchen MyChart Message (if you have MyChart) OR . A paper copy in the mail If you have any lab test that is abnormal or we need to change your treatment, we will call you to review the results.   Testing/Procedures: None Ordered   Follow-Up: At [facility], you and your health needs are our priority.  As part of our continuing mission to provide you with exceptional heart care, we have created designated Provider Care Teams.  These Care Teams include your primary Cardiologist (physician) and Advanced Practice Providers (APPs -  Physician Assistants and Nurse Practitioners) who all work together to provide you with the care you need, when you need it.  We recommend signing up for the patient portal called "MyChart".  Sign up information is provided on this After Visit Summary.  MyChart is used to connect with patients for Virtual Visits (Telemedicine).  Patients are able to view lab/test results, encounter notes, upcoming appointments, etc.  Non-urgent messages can be sent to your provider as well.   To learn more about what you can do with MyChart, go to [email or web address].    Your next appointment:   1 month(s)  The format for your next appointment:   In Person  Provider:   Debbe Odea, MD   Other Instructions  Metoprolol Extended-Release Tablets What is this medicine? METOPROLOL (me TOE proe lole) is a beta blocker. It decreases the amount of work your heart has to do and helps your heart beat regularly. It treats high blood pressure and/or prevent chest pain (also called angina). It also treats heart failure. This medicine may be used for other purposes; ask your health care provider or pharmacist if you have questions. COMMON BRAND NAME(S): toprol, Toprol XL What should I tell my health care provider before I take this medicine? They need to know if you have any of these conditions:  diabetes  heart or vessel  disease like slow heart rate, worsening heart failure, heart block, sick sinus syndrome or Raynaud's disease  kidney disease  liver disease  lung or breathing disease, like asthma or emphysema  pheochromocytoma  thyroid disease  an unusual or allergic reaction to metoprolol, other beta-blockers, medicines, foods, dyes, or preservatives  pregnant or trying to get pregnant  breast-feeding How should I use this medicine? Take this drug by mouth. Take it as directed on the prescription label at the same time every day. Take it with food. You may cut the tablet in half if it is scored (has a line in the middle of it). This may help you swallow the tablet if the whole tablet is too big. Be sure to take both halves. Do not take just one-half of the tablet. Keep taking it unless your health care provider tells you to stop. Talk to your health care provider about the use of this drug in children. While it may be prescribed for children as young as 6 for selected conditions, precautions do apply. Overdosage: If you think you have taken too much of this medicine contact a poison control center or emergency room at once. NOTE: This medicine is only for you. Do not share this medicine with others. What if I miss a dose? If you miss a dose, take it as soon as you can. If it is almost time for your next dose, take only that dose. Do not take double or extra doses. What may interact with this medicine? This medicine may interact with the following medications:  certain medicines for blood pressure, heart disease, irregular heart beat  certain medicines for depression, like monoamine oxidase (MAO) inhibitors, fluoxetine, or paroxetine  clonidine  dobutamine  epinephrine  isoproterenol  reserpine This list may not describe all possible interactions. Give your health care provider a list of all the medicines, herbs, non-prescription drugs, or dietary supplements you use. Also tell them if you  smoke, drink alcohol, or use illegal drugs. Some items may interact with your medicine. What should I watch for while using this medicine? Visit your doctor or health care professional for regular check ups. Contact your doctor right away if your symptoms worsen. Check your blood pressure and pulse rate regularly. Ask your health care professional what your blood pressure and pulse rate should be, and when you should contact them. You may get drowsy or dizzy. Do not drive, use machinery, or do anything that needs mental alertness until you know how this medicine affects you. Do not sit or stand up quickly, especially if you are an older patient. This reduces the risk of dizzy or fainting spells. Contact your doctor if these symptoms continue. Alcohol may interfere with the effect of this medicine. Avoid alcoholic drinks. This medicine may increase blood sugar. Ask your healthcare provider if changes in diet or medicines are needed if you have diabetes. What side effects may I notice from receiving this medicine? Side effects that you should report to your doctor or health care professional as soon as possible:  allergic reactions like skin rash, itching or hives  cold or numb hands or feet  depression  difficulty breathing  faint  fever with sore throat  irregular heartbeat, chest pain  rapid weight gain   signs and symptoms of high blood sugar such as being more thirsty or hungry or having to urinate more than normal. You may also feel very tired or have blurry vision.  swollen legs or ankles Side effects that  usually do not require medical attention (report to your doctor or health care professional if they continue or are bothersome):  anxiety or nervousness  change in sex drive or performance  dry skin  headache  nightmares or trouble sleeping  Wheless term memory loss  stomach upset or diarrhea This list may not describe all possible side effects. Call your doctor for  medical advice about side effects. You may report side effects to FDA at 1-800-FDA-1088. Where should I keep my medicine? Keep out of the reach of children and pets. Store at room temperature between 20 and 25 degrees C (68 and 77 degrees F). Throw away any unused drug after the expiration date. NOTE: This sheet is a summary. It may not cover all possible information. If you have questions about this medicine, talk to your doctor, pharmacist, or health care provider.  2020 Elsevier/Gold Standard (2018-08-28 18:23:00)      Signed, Debbe OdeaBrian Agbor-Etang, MD  08/17/2019 5:29 PM    [facility]

## 2019-08-17 NOTE — Patient Instructions (Signed)
Medication Instructions:   Your physician has recommended you make the following change in your medication:   START taking Toprol XL:  Take 0.5 tablets (25 mg total) by mouth daily  *If you need a refill on your cardiac medications before your next appointment, please call your pharmacy*   Lab Work: None Ordered If you have labs (blood work) drawn today and your tests are completely normal, you will receive your results only by: Marland Kitchen MyChart Message (if you have MyChart) OR . A paper copy in the mail If you have any lab test that is abnormal or we need to change your treatment, we will call you to review the results.   Testing/Procedures: None Ordered   Follow-Up: At [facility], you and your health needs are our priority.  As part of our continuing mission to provide you with exceptional heart care, we have created designated Provider Care Teams.  These Care Teams include your primary Cardiologist (physician) and Advanced Practice Providers (APPs -  Physician Assistants and Nurse Practitioners) who all work together to provide you with the care you need, when you need it.  We recommend signing up for the patient portal called "MyChart".  Sign up information is provided on this After Visit Summary.  MyChart is used to connect with patients for Virtual Visits (Telemedicine).  Patients are able to view lab/test results, encounter notes, upcoming appointments, etc.  Non-urgent messages can be sent to your provider as well.   To learn more about what you can do with MyChart, go to [email or web address].    Your next appointment:   1 month(s)  The format for your next appointment:   In Person  Provider:   Debbe Odea, MD   Other Instructions  Metoprolol Extended-Release Tablets What is this medicine? METOPROLOL (me TOE proe lole) is a beta blocker. It decreases the amount of work your heart has to do and helps your heart beat regularly. It treats high blood pressure and/or  prevent chest pain (also called angina). It also treats heart failure. This medicine may be used for other purposes; ask your health care provider or pharmacist if you have questions. COMMON BRAND NAME(S): toprol, Toprol XL What should I tell my health care provider before I take this medicine? They need to know if you have any of these conditions:  diabetes  heart or vessel disease like slow heart rate, worsening heart failure, heart block, sick sinus syndrome or Raynaud's disease  kidney disease  liver disease  lung or breathing disease, like asthma or emphysema  pheochromocytoma  thyroid disease  an unusual or allergic reaction to metoprolol, other beta-blockers, medicines, foods, dyes, or preservatives  pregnant or trying to get pregnant  breast-feeding How should I use this medicine? Take this drug by mouth. Take it as directed on the prescription label at the same time every day. Take it with food. You may cut the tablet in half if it is scored (has a line in the middle of it). This may help you swallow the tablet if the whole tablet is too big. Be sure to take both halves. Do not take just one-half of the tablet. Keep taking it unless your health care provider tells you to stop. Talk to your health care provider about the use of this drug in children. While it may be prescribed for children as young as 6 for selected conditions, precautions do apply. Overdosage: If you think you have taken too much of this medicine contact a poison control  center or emergency room at once. NOTE: This medicine is only for you. Do not share this medicine with others. What if I miss a dose? If you miss a dose, take it as soon as you can. If it is almost time for your next dose, take only that dose. Do not take double or extra doses. What may interact with this medicine? This medicine may interact with the following medications:  certain medicines for blood pressure, heart disease, irregular  heart beat  certain medicines for depression, like monoamine oxidase (MAO) inhibitors, fluoxetine, or paroxetine  clonidine  dobutamine  epinephrine  isoproterenol  reserpine This list may not describe all possible interactions. Give your health care provider a list of all the medicines, herbs, non-prescription drugs, or dietary supplements you use. Also tell them if you smoke, drink alcohol, or use illegal drugs. Some items may interact with your medicine. What should I watch for while using this medicine? Visit your doctor or health care professional for regular check ups. Contact your doctor right away if your symptoms worsen. Check your blood pressure and pulse rate regularly. Ask your health care professional what your blood pressure and pulse rate should be, and when you should contact them. You may get drowsy or dizzy. Do not drive, use machinery, or do anything that needs mental alertness until you know how this medicine affects you. Do not sit or stand up quickly, especially if you are an older patient. This reduces the risk of dizzy or fainting spells. Contact your doctor if these symptoms continue. Alcohol may interfere with the effect of this medicine. Avoid alcoholic drinks. This medicine may increase blood sugar. Ask your healthcare provider if changes in diet or medicines are needed if you have diabetes. What side effects may I notice from receiving this medicine? Side effects that you should report to your doctor or health care professional as soon as possible:  allergic reactions like skin rash, itching or hives  cold or numb hands or feet  depression  difficulty breathing  faint  fever with sore throat  irregular heartbeat, chest pain  rapid weight gain   signs and symptoms of high blood sugar such as being more thirsty or hungry or having to urinate more than normal. You may also feel very tired or have blurry vision.  swollen legs or ankles Side effects  that usually do not require medical attention (report to your doctor or health care professional if they continue or are bothersome):  anxiety or nervousness  change in sex drive or performance  dry skin  headache  nightmares or trouble sleeping  Reicks term memory loss  stomach upset or diarrhea This list may not describe all possible side effects. Call your doctor for medical advice about side effects. You may report side effects to FDA at 1-800-FDA-1088. Where should I keep my medicine? Keep out of the reach of children and pets. Store at room temperature between 20 and 25 degrees C (68 and 77 degrees F). Throw away any unused drug after the expiration date. NOTE: This sheet is a summary. It may not cover all possible information. If you have questions about this medicine, talk to your doctor, pharmacist, or health care provider.  2020 Elsevier/Gold Standard (2018-08-28 18:23:00)

## 2019-08-27 ENCOUNTER — Encounter: Payer: Self-pay | Admitting: Internal Medicine

## 2019-08-27 ENCOUNTER — Ambulatory Visit (INDEPENDENT_AMBULATORY_CARE_PROVIDER_SITE_OTHER): Payer: 59 | Admitting: Internal Medicine

## 2019-08-27 ENCOUNTER — Other Ambulatory Visit: Payer: Self-pay

## 2019-08-27 VITALS — BP 124/84 | HR 107 | Temp 98.2°F | Ht 64.5 in | Wt 251.0 lb

## 2019-08-27 DIAGNOSIS — F410 Panic disorder [episodic paroxysmal anxiety] without agoraphobia: Secondary | ICD-10-CM

## 2019-08-27 DIAGNOSIS — Z0000 Encounter for general adult medical examination without abnormal findings: Secondary | ICD-10-CM | POA: Diagnosis not present

## 2019-08-27 DIAGNOSIS — F419 Anxiety disorder, unspecified: Secondary | ICD-10-CM | POA: Diagnosis not present

## 2019-08-27 DIAGNOSIS — J4520 Mild intermittent asthma, uncomplicated: Secondary | ICD-10-CM | POA: Diagnosis not present

## 2019-08-27 DIAGNOSIS — R519 Headache, unspecified: Secondary | ICD-10-CM | POA: Diagnosis not present

## 2019-08-27 DIAGNOSIS — F3181 Bipolar II disorder: Secondary | ICD-10-CM | POA: Diagnosis not present

## 2019-08-27 DIAGNOSIS — Z0001 Encounter for general adult medical examination with abnormal findings: Secondary | ICD-10-CM

## 2019-08-27 DIAGNOSIS — F329 Major depressive disorder, single episode, unspecified: Secondary | ICD-10-CM

## 2019-08-27 DIAGNOSIS — R000 Tachycardia, unspecified: Secondary | ICD-10-CM | POA: Diagnosis not present

## 2019-08-27 DIAGNOSIS — F32A Depression, unspecified: Secondary | ICD-10-CM

## 2019-08-27 MED ORDER — NORTRIPTYLINE HCL 25 MG PO CAPS: 25.0000 mg | ORAL_CAPSULE | Freq: Every day | ORAL | 3 refills | Status: DC

## 2019-08-27 NOTE — Progress Notes (Signed)
Subjective:    Patient ID: Jill Sandoval, female    DOB: 07-16-1990, 29 y.o.   MRN: [id number]  HPI  Patient presents the clinic today for her annual exam.  She is also due to follow-up chronic conditions.  Anxiety, Bipolar Depression and Panic Attacks: Persistent, managed on Trileptal and Risperdal.  She sees a therapist.  She is currently followed in psychiatry.  She denies SI/HI.  Childhood Asthma: Currently not an issue.  She uses Albuterol as needed with good relief of symptoms.  There are no PFTs on file.  Frequent Headaches: Triggered by stress.  These occur about 1 x month.  She is taking Nortriptyline and Imitrex as prescribed with good relief of symptoms.  She is not following with neurology.  Tachycardia: She is taking 1/2 Metoprolol daily. She is following with cardiology.  Flu: 10/2018 Tetanus: 07/2016 Covid: Pfizer Pap smear: 01/2016 Dentist: biannually  Diet: She does eat meat. She consumes fruits and veggies daily. She does eat some fried foods. She drinks mostly water, tea and lemonade Exercise: None  Review of Systems  Past Medical History:  Diagnosis Date  . Anxiety and depression   . Asthma    inhaler prn  . Fetal demise > 22 weeks, delivered, current hospitalization 11/04/2015  . Miscarriage     Current Outpatient Medications  Medication Sig Dispense Refill  . albuterol (VENTOLIN HFA) 108 (90 Base) MCG/ACT inhaler Inhale 2 puffs into the lungs every 6 (six) hours as needed for wheezing or shortness of breath. 18 g 0  . metoprolol succinate (TOPROL-XL) 50 MG 24 hr tablet Take 0.5 tablets (25 mg total) by mouth daily. Take with or immediately following a meal. 15 tablet 5  . nortriptyline (PAMELOR) 25 MG capsule TAKE 1 CAPSULE BY MOUTH AT BEDTIME. 30 capsule 2  . ondansetron (ZOFRAN) 4 MG tablet Take 1 tablet (4 mg total) by mouth every 8 (eight) hours as needed. 20 tablet 0  . Oxcarbazepine (TRILEPTAL) 300 MG tablet TAKE 3 TABLETS BY MOUTH EVERY MORNING  AND 3 TABLETS BY MOUTH AT BEDTIME 180 tablet 2  . risperiDONE (RISPERDAL) 1 MG tablet Take 1 tablet (1 mg total) by mouth at bedtime. 30 tablet 1  . SUMAtriptan (IMITREX) 25 MG tablet Take 1 tablet (25 mg total) by mouth every 2 (two) hours as needed for migraine. May repeat in 2 hours if headache persists or recurs. 10 tablet 2   No current facility-administered medications for this visit.    No Known Allergies  Family History  Problem Relation Age of Onset  . Hypertension Maternal Grandmother   . Prostate cancer Maternal Grandfather   . Hearing loss Brother   . Hearing loss Paternal Grandfather   . Hypertension Father   . Other Neg Hx     Social History   Socioeconomic History  . Marital status: Married    Spouse name: Not on file  . Number of children: 2  . Years of education: Not on file  . Highest education level: Not on file  Occupational History  . Not on file  Tobacco Use  . Smoking status: Former Smoker    Packs/day: 0.25    Types: Cigarettes    Quit date: 11/14/2018    Years since quitting: 0.7  . Smokeless tobacco: Never Used  Vaping Use  . Vaping Use: Some days  Substance and Sexual Activity  . Alcohol use: No  . Drug use: No  . Sexual activity: Yes  Other Topics Concern  .  Not on file  Social History Narrative   Married.   2 children.   Works at [employer].   Enjoys camping, being outdoors.    Social Determinants of Health   Financial Resource Strain:   . Difficulty of Paying Living Expenses:   Food Insecurity:   . Worried About Charity fundraiser in the Last Year:   . Arboriculturist in the Last Year:   Transportation Needs:   . Film/video editor (Medical):   Marland Kitchen Lack of Transportation (Non-Medical):   Physical Activity:   . Days of Exercise per Week:   . Minutes of Exercise per Session:   Stress:   . Feeling of Stress :   Social Connections:   . Frequency of Communication with Friends and Family:   . Frequency of Social Gatherings  with Friends and Family:   . Attends Religious Services:   . Active Member of Clubs or Organizations:   . Attends Archivist Meetings:   Marland Kitchen Marital Status:   Intimate Partner Violence:   . Fear of Current or Ex-Partner:   . Emotionally Abused:   Marland Kitchen Physically Abused:   . Sexually Abused:      Constitutional: Patient reports frequent headaches.  Denies fever, malaise, fatigue, or abrupt weight changes.  HEENT: Denies eye pain, eye redness, ear pain, ringing in the ears, wax buildup, runny nose, nasal congestion, bloody nose, or sore throat. Respiratory: Denies difficulty breathing, shortness of breath, cough or sputum production.   Cardiovascular: Pt reports palpitations. Denies chest pain, chest tightness, or swelling in the hands or feet.  Gastrointestinal: Pt reports intermittent reflux. Denies abdominal pain, bloating, constipation, diarrhea or blood in the stool.  GU: Denies urgency, frequency, pain with urination, burning sensation, blood in urine, odor or discharge. Musculoskeletal: Denies decrease in range of motion, difficulty with gait, muscle pain or joint pain and swelling.  Skin: Denies redness, rashes, lesions or ulcercations.  Neurological: Denies dizziness, difficulty with memory, difficulty with speech or problems with balance and coordination.  Psych: Patient has a history of anxiety and depression.  Denies SI/HI.  No other specific complaints in a complete review of systems (except as listed in HPI above).     Objective:   Physical Exam  BP 124/84   Pulse (!) 107   Temp 98.2 F (36.8 C) (Temporal)   Ht 5' 4.5" (1.638 m)   Wt (!) 251 lb (113.9 kg)   SpO2 98%   BMI 42.42 kg/m   Wt Readings from Last 3 Encounters:  08/17/19 252 lb 2 oz (114.4 kg)  07/08/19 249 lb (112.9 kg)  06/22/19 249 lb 3 oz (113 kg)    General: Appears her stated age, obese, in NAD. Skin: Warm, dry and intact. No rashesnoted. HEENT: Head: normal shape and size; Eyes: sclera  white, no icterus, conjunctiva pink, PERRLA and EOMs intact;  Neck:  Neck supple, trachea midline. No masses, lumps or thyromegaly present.  Cardiovascular: Tachycardic with normal rhythm. S1,S2 noted.  No murmur, rubs or gallops noted. No JVD or BLE edema.  Pulmonary/Chest: Normal effort and positive vesicular breath sounds. No respiratory distress. No wheezes, rales or ronchi noted.  Abdomen: Soft and nontender. Normal bowel sounds. No distention or masses noted. Liver, spleen and kidneys non palpable. Musculoskeletal: Strength 5/5 BUE/BLE. No difficulty with gait.  Neurological: Alert and oriented. Cranial nerves II-XII grossly intact. Coordination normal.  Psychiatric: Mood and affect normal. Behavior is normal. Judgment and thought content normal.  BMET    Component Value Date/Time   NA 139 10/14/2018 1421   NA 136 06/30/2015 0000   NA 137 07/28/2011 1558   K 4.1 10/14/2018 1421   K 3.7 07/28/2011 1558   CL 105 10/14/2018 1421   CL 107 07/28/2011 1558   CO2 29 10/14/2018 1421   CO2 22 07/28/2011 1558   GLUCOSE 81 10/14/2018 1421   GLUCOSE 76 07/28/2011 1558   BUN 17 10/14/2018 1421   BUN 9 06/30/2015 0000   BUN 11 07/28/2011 1558   CREATININE 0.85 10/14/2018 1421   CREATININE 0.93 07/28/2011 1558   CALCIUM 9.3 10/14/2018 1421   CALCIUM 9.1 07/28/2011 1558   GFRNONAA 118 06/30/2015 0000   GFRNONAA >60 07/28/2011 1558   GFRAA 136 06/30/2015 0000   GFRAA >60 07/28/2011 1558    Lipid Panel     Component Value Date/Time   CHOL 211 (H) 07/16/2018 1036   CHOL 194 06/30/2015 0000   TRIG 89.0 07/16/2018 1036   HDL 47.90 07/16/2018 1036   HDL 57 06/30/2015 0000   CHOLHDL 4 07/16/2018 1036   VLDL 17.8 07/16/2018 1036   LDLCALC 145 (H) 07/16/2018 1036   LDLCALC 103 (H) 06/30/2015 0000    CBC    Component Value Date/Time   WBC 9.5 10/14/2018 1421   RBC 4.66 10/14/2018 1421   HGB 13.2 10/14/2018 1421   HGB 12.6 06/30/2015 0000   HCT 40.3 10/14/2018 1421   HCT 38.1  06/30/2015 0000   PLT 291.0 10/14/2018 1421   PLT 320 06/30/2015 0000   MCV 86.4 10/14/2018 1421   MCV 84 06/30/2015 0000   MCV 89 07/28/2011 1558   MCH 27.9 09/25/2016 0454   MCHC 32.9 10/14/2018 1421   RDW 13.2 10/14/2018 1421   RDW 13.9 06/30/2015 0000   RDW 13.3 07/28/2011 1558   LYMPHSABS 2.4 06/30/2015 0000   EOSABS 0.2 06/30/2015 0000   BASOSABS 0.0 06/30/2015 0000    Hgb A1C Lab Results  Component Value Date   HGBA1C 5.0 06/30/2015            Assessment & Plan:   Preventative Health Maintenance:  Encouraged her to get a flu shot the fall Tetanus UTD Covid UTD Pap smear due, she will call GYN to schedule Encouraged her to consume a balanced diet and exercise regimen Advised her to see an eye doctor and dentist annually We will check CBC, C met, lipid, A1c today  RTC in 1 year, sooner if needed Webb Silversmith, NP This visit occurred during the SARS-CoV-2 public health emergency.  Safety protocols were in place, including screening questions prior to the visit, additional usage of staff PPE, and extensive cleaning of exam room while observing appropriate contact time as indicated for disinfecting solutions.

## 2019-08-27 NOTE — Assessment & Plan Note (Signed)
Continue Trileptal and Risperdal She will continue to follow with psych 

## 2019-08-27 NOTE — Assessment & Plan Note (Signed)
Continue Trileptal and Risperdal She will continue to follow with psych

## 2019-08-27 NOTE — Assessment & Plan Note (Signed)
Increase Metoprolol to 1 whole tab daily She will continue to follow with cardiology

## 2019-08-27 NOTE — Patient Instructions (Signed)
Health Maintenance, Female Adopting a healthy lifestyle and getting preventive care are important in promoting health and wellness. Ask your health care provider about:  The right schedule for you to have regular tests and exams.  Things you can do on your own to prevent diseases and keep yourself healthy. What should I know about diet, weight, and exercise? Eat a healthy diet   Eat a diet that includes plenty of vegetables, fruits, low-fat dairy products, and lean protein.  Do not eat a lot of foods that are high in solid fats, added sugars, or sodium. Maintain a healthy weight Body mass index (BMI) is used to identify weight problems. It estimates body fat based on height and weight. Your health care provider can help determine your BMI and help you achieve or maintain a healthy weight. Get regular exercise Get regular exercise. This is one of the most important things you can do for your health. Most adults should:  Exercise for at least 150 minutes each week. The exercise should increase your heart rate and make you sweat (moderate-intensity exercise).  Do strengthening exercises at least twice a week. This is in addition to the moderate-intensity exercise.  Spend less time sitting. Even light physical activity can be beneficial. Watch cholesterol and blood lipids Have your blood tested for lipids and cholesterol at 29 years of age, then have this test every 5 years. Have your cholesterol levels checked more often if:  Your lipid or cholesterol levels are high.  You are older than 29 years of age.  You are at high risk for heart disease. What should I know about cancer screening? Depending on your health history and family history, you may need to have cancer screening at various ages. This may include screening for:  Breast cancer.  Cervical cancer.  Colorectal cancer.  Skin cancer.  Lung cancer. What should I know about heart disease, diabetes, and high blood  pressure? Blood pressure and heart disease  High blood pressure causes heart disease and increases the risk of stroke. This is more likely to develop in people who have high blood pressure readings, are of African descent, or are overweight.  Have your blood pressure checked: ? Every 3-5 years if you are 18-39 years of age. ? Every year if you are 40 years old or older. Diabetes Have regular diabetes screenings. This checks your fasting blood sugar level. Have the screening done:  Once every three years after age 40 if you are at a normal weight and have a low risk for diabetes.  More often and at a younger age if you are overweight or have a high risk for diabetes. What should I know about preventing infection? Hepatitis B If you have a higher risk for hepatitis B, you should be screened for this virus. Talk with your health care provider to find out if you are at risk for hepatitis B infection. Hepatitis C Testing is recommended for:  Everyone born from 1945 through 1965.  Anyone with known risk factors for hepatitis C. Sexually transmitted infections (STIs)  Get screened for STIs, including gonorrhea and chlamydia, if: ? You are sexually active and are younger than 29 years of age. ? You are older than 29 years of age and your health care provider tells you that you are at risk for this type of infection. ? Your sexual activity has changed since you were last screened, and you are at increased risk for chlamydia or gonorrhea. Ask your health care provider if   you are at risk.  Ask your health care provider about whether you are at high risk for HIV. Your health care provider may recommend a prescription medicine to help prevent HIV infection. If you choose to take medicine to prevent HIV, you should first get tested for HIV. You should then be tested every 3 months for as long as you are taking the medicine. Pregnancy  If you are about to stop having your period (premenopausal) and  you may become pregnant, seek counseling before you get pregnant.  Take 400 to 800 micrograms (mcg) of folic acid every day if you become pregnant.  Ask for birth control (contraception) if you want to prevent pregnancy. Osteoporosis and menopause Osteoporosis is a disease in which the bones lose minerals and strength with aging. This can result in bone fractures. If you are 65 years old or older, or if you are at risk for osteoporosis and fractures, ask your health care provider if you should:  Be screened for bone loss.  Take a calcium or vitamin D supplement to lower your risk of fractures.  Be given hormone replacement therapy (HRT) to treat symptoms of menopause. Follow these instructions at home: Lifestyle  Do not use any products that contain nicotine or tobacco, such as cigarettes, e-cigarettes, and chewing tobacco. If you need help quitting, ask your health care provider.  Do not use street drugs.  Do not share needles.  Ask your health care provider for help if you need support or information about quitting drugs. Alcohol use  Do not drink alcohol if: ? Your health care provider tells you not to drink. ? You are pregnant, may be pregnant, or are planning to become pregnant.  If you drink alcohol: ? Limit how much you use to 0-1 drink a day. ? Limit intake if you are breastfeeding.  Be aware of how much alcohol is in your drink. In the [place] one drink equals one 12 oz bottle of beer (355 mL), one 5 oz glass of wine (148 mL), or one 1 oz glass of hard liquor (44 mL). General instructions  Schedule regular health, dental, and eye exams.  Stay current with your vaccines.  Tell your health care provider if: ? You often feel depressed. ? You have ever been abused or do not feel safe at home. Summary  Adopting a healthy lifestyle and getting preventive care are important in promoting health and wellness.  Follow your health care provider's instructions about healthy  diet, exercising, and getting tested or screened for diseases.  Follow your health care provider's instructions on monitoring your cholesterol and blood pressure. This information is not intended to replace advice given to you by your health care provider. Make sure you discuss any questions you have with your health care provider. Document Revised: 01/08/2018 Document Reviewed: 01/08/2018 Elsevier Patient Education  2020 [employer]  

## 2019-08-27 NOTE — Assessment & Plan Note (Signed)
Continue Nortriptyline and Imitrex Headaches have improved with decreased stress

## 2019-08-27 NOTE — Assessment & Plan Note (Signed)
Continue Albuterol prn 

## 2019-08-28 ENCOUNTER — Encounter: Payer: Self-pay | Admitting: Internal Medicine

## 2019-08-28 DIAGNOSIS — E782 Mixed hyperlipidemia: Secondary | ICD-10-CM

## 2019-08-28 LAB — LIPID PANEL
Cholesterol: 215 mg/dL — ABNORMAL HIGH (ref 0–200)
HDL: 45.7 mg/dL (ref >39.00)
NonHDL: 168.96
Total CHOL/HDL Ratio: 5
Triglycerides: 271 mg/dL — ABNORMAL HIGH (ref 0.0–149.0)
VLDL: 54.2 mg/dL — ABNORMAL HIGH (ref 0.0–40.0)

## 2019-08-28 LAB — COMPREHENSIVE METABOLIC PANEL
ALT: 19 U/L (ref 0–35)
AST: 11 U/L (ref 0–37)
Albumin: 4.6 g/dL (ref 3.5–5.2)
Alkaline Phosphatase: 91 U/L (ref 39–117)
BUN: 17 mg/dL (ref 6–23)
CO2: 28 mEq/L (ref 19–32)
Calcium: 9.5 mg/dL (ref 8.4–10.5)
Chloride: 105 mEq/L (ref 96–112)
Creatinine, Ser: 0.94 mg/dL (ref 0.40–1.20)
GFR: 70.53 mL/min (ref >60.00)
Glucose, Bld: 93 mg/dL (ref 70–99)
Potassium: 4.4 mEq/L (ref 3.5–5.1)
Sodium: 139 mEq/L (ref 135–145)
Total Bilirubin: 0.2 mg/dL (ref 0.2–1.2)
Total Protein: 6.8 g/dL (ref 6.0–8.3)

## 2019-08-28 LAB — CBC
HCT: 39.1 % (ref 36.0–46.0)
Hemoglobin: 12.8 g/dL (ref 12.0–15.0)
MCHC: 32.7 g/dL (ref 30.0–36.0)
MCV: 86.5 fl (ref 78.0–100.0)
Platelets: 310 10*3/uL (ref 150.0–400.0)
RBC: 4.52 Mil/uL (ref 3.87–5.11)
RDW: 13.2 % (ref 11.5–15.5)
WBC: 11 10*3/uL — ABNORMAL HIGH (ref 4.0–10.5)

## 2019-08-28 LAB — HEMOGLOBIN A1C: Hgb A1c MFr Bld: 5.1 % (ref 4.6–6.5)

## 2019-08-28 LAB — LDL CHOLESTEROL, DIRECT: Direct LDL: 144 mg/dL

## 2019-09-07 ENCOUNTER — Telehealth: Payer: Self-pay | Admitting: Cardiology

## 2019-09-07 ENCOUNTER — Telehealth: Payer: Self-pay

## 2019-09-07 DIAGNOSIS — F3177 Bipolar disorder, in partial remission, most recent episode mixed: Secondary | ICD-10-CM

## 2019-09-07 DIAGNOSIS — F3181 Bipolar II disorder: Secondary | ICD-10-CM

## 2019-09-07 MED ORDER — RISPERIDONE 1 MG PO TABS: 1.0000 mg | ORAL_TABLET | Freq: Every day | ORAL | 0 refills | Status: DC

## 2019-09-07 MED ORDER — METOPROLOL SUCCINATE ER 50 MG PO TB24: 25.0000 mg | ORAL_TABLET | Freq: Every day | ORAL | 1 refills | Status: DC

## 2019-09-07 MED ORDER — OXCARBAZEPINE 300 MG PO TABS: ORAL_TABLET | ORAL | 0 refills | Status: DC

## 2019-09-07 NOTE — Telephone Encounter (Signed)
Requested Prescriptions   Signed Prescriptions Disp Refills   metoprolol succinate (TOPROL-XL) 50 MG 24 hr tablet 45 tablet 1    Sig: Take 0.5 tablets (25 mg total) by mouth daily. Take with or immediately following a meal.    Authorizing Provider: Debbe Odea    Ordering User: Thayer Headings, Keithan Dileonardo L

## 2019-09-07 NOTE — Telephone Encounter (Signed)
Patient changed pharmacy to [facility] on [address] in [place]  *STAT* If patient is at the pharmacy, call can be transferred to refill team.   1. Which medications need to be refilled? (please list name of each medication and dose if known) metoprolol 50 mg (.5 daily until [place] talks to PCP to up it)  2. Which pharmacy/location (including street and city if local pharmacy) is medication to be sent to? [facility] [address] in [place]  3. Do they need a 30 day or 90 day supply? 90

## 2019-09-07 NOTE — Telephone Encounter (Signed)
pt called left message that she needed a 90 day supply of her risperdone and trileptal [facility] to [facility] [place]

## 2019-09-07 NOTE — Telephone Encounter (Signed)
I have sent risperidone and Trileptal to pharmacy.

## 2019-09-09 ENCOUNTER — Ambulatory Visit: Payer: 59 | Admitting: Licensed Clinical Social Worker

## 2019-09-16 ENCOUNTER — Other Ambulatory Visit: Payer: Self-pay

## 2019-09-16 ENCOUNTER — Telehealth (INDEPENDENT_AMBULATORY_CARE_PROVIDER_SITE_OTHER): Payer: 59 | Admitting: Psychiatry

## 2019-09-16 DIAGNOSIS — F3181 Bipolar II disorder: Secondary | ICD-10-CM

## 2019-09-16 NOTE — Progress Notes (Signed)
When writer was able to reach patient by phone today at the time of her appointment she reported that she has left several messages to cancel this appointment and has been doing so since the past 3 days. Discussed with patient I will pass the message to staff here to cancel.  She will call back to reschedule.

## 2019-09-24 ENCOUNTER — Ambulatory Visit: Payer: 59 | Admitting: Cardiology

## 2019-10-16 ENCOUNTER — Ambulatory Visit: Payer: Self-pay | Admitting: Cardiology

## 2019-11-18 ENCOUNTER — Encounter: Payer: Self-pay | Admitting: Psychiatry

## 2019-12-02 ENCOUNTER — Telehealth (INDEPENDENT_AMBULATORY_CARE_PROVIDER_SITE_OTHER): Payer: Self-pay | Admitting: Psychiatry

## 2019-12-02 ENCOUNTER — Encounter: Payer: Self-pay | Admitting: Psychiatry

## 2019-12-02 ENCOUNTER — Other Ambulatory Visit: Payer: Self-pay

## 2019-12-02 DIAGNOSIS — F410 Panic disorder [episodic paroxysmal anxiety] without agoraphobia: Secondary | ICD-10-CM

## 2019-12-02 DIAGNOSIS — F3176 Bipolar disorder, in full remission, most recent episode depressed: Secondary | ICD-10-CM

## 2019-12-02 MED ORDER — OXCARBAZEPINE 300 MG PO TABS: ORAL_TABLET | ORAL | 1 refills | Status: DC

## 2019-12-02 MED ORDER — RISPERIDONE 1 MG PO TABS: 0.5000 mg | ORAL_TABLET | Freq: Every day | ORAL | 1 refills | Status: DC

## 2019-12-02 NOTE — Progress Notes (Signed)
Virtual Visit via Video Note  I connected with Jill Sandoval on 12/02/19 at  4:20 PM EDT by a video enabled telemedicine application and verified that I am speaking with the correct person using two identifiers. Location Provider Location : [facility] Patient Location : Home  Participants: Patient , Provider    I discussed the limitations of evaluation and management by telemedicine and the availability of in person appointments. The patient expressed understanding and agreed to proceed.    I discussed the assessment and treatment plan with the patient. The patient was provided an opportunity to ask questions and all were answered. The patient agreed with the plan and demonstrated an understanding of the instructions.   The patient was advised to call back or seek an in-person evaluation if the symptoms worsen or if the condition fails to improve as anticipated.   BH MD OP Progress Note  12/02/2019 5:26 PM Jill Sandoval  MRN:  [id number]  Chief Complaint:  Chief Complaint    Follow-up     HPI: Jill Sandoval is a 29 year old Caucasian female, married, currently unemployed, lives in [place], has a history of bipolar disorder type II, asthma, tobacco use disorder, panic attacks was evaluated by telemedicine today.  Patient today reports she no longer works well, health and is currently unemployed.  She hence does not have any health insurance plan and has to wait till the beginning of January to get on her husband's health insurance plan.  She hence missed her last appointment with Clinical research associate.  Patient however today reports currently she is doing much better with regards to her mood.  She reports she does not have any panic attacks anymore.  She reports sleep is good.  Patient is agreeable to reducing the dosage of risperidone.  She denies any suicidality, homicidality or perceptual disturbances.  Patient denies any other concerns today.    Visit Diagnosis:    ICD-10-CM   1.  Bipolar disorder, in full remission, most recent episode depressed (HCC)  F31.76 risperiDONE (RISPERDAL) 1 MG tablet   type 2  2. Panic attacks  F41.0 Oxcarbazepine (TRILEPTAL) 300 MG tablet    Past Psychiatric History: I have reviewed past psychiatric history from my progress note from 10/15/2018.  Past trials of Celexa, Trileptal  Past Medical History:  Past Medical History:  Diagnosis Date  . Anxiety and depression   . Asthma    inhaler prn  . Fetal demise > 22 weeks, delivered, current hospitalization 11/04/2015  . Miscarriage     Past Surgical History:  Procedure Laterality Date  . CESAREAN SECTION N/A 03/24/2012   Procedure: CESAREAN SECTION;  Surgeon: Mickel Baas, MD;  Location: [facility];  Service: Obstetrics;  Laterality: N/A;  . CESAREAN SECTION N/A 09/24/2016   Procedure: CESAREAN SECTION;  Surgeon: Waynard Reeds, MD;  Location: [facility];  Service: Obstetrics;  Laterality: N/A;    Family Psychiatric History: I have reviewed family psychiatric history from my progress note on 10/15/2018  Family History:  Family History  Problem Relation Age of Onset  . Hypertension Maternal Grandmother   . Prostate cancer Maternal Grandfather   . Hearing loss Brother   . Hearing loss Paternal Grandfather   . Hypertension Father   . Other Neg Hx     Social History: Reviewed social history from my progress note on 10/15/2018 Social History   Socioeconomic History  . Marital status: Married    Spouse name: Not on file  . Number of children: 2  .  Years of education: Not on file  . Highest education level: Not on file  Occupational History  . Not on file  Tobacco Use  . Smoking status: Former Smoker    Packs/day: 0.25    Types: Cigarettes    Quit date: 11/14/2018    Years since quitting: 1.0  . Smokeless tobacco: Never Used  Vaping Use  . Vaping Use: Some days  Substance and Sexual Activity  . Alcohol use: No  . Drug use: No  . Sexual activity: Yes  Other Topics  Concern  . Not on file  Social History Narrative   Married.   2 children.   Works at [employer].   Enjoys camping, being outdoors.    Social Determinants of Health   Financial Resource Strain:   . Difficulty of Paying Living Expenses: Not on file  Food Insecurity:   . Worried About Programme researcher, broadcasting/film/video in the Last Year: Not on file  . Ran Out of Food in the Last Year: Not on file  Transportation Needs:   . Lack of Transportation (Medical): Not on file  . Lack of Transportation (Non-Medical): Not on file  Physical Activity:   . Days of Exercise per Week: Not on file  . Minutes of Exercise per Session: Not on file  Stress:   . Feeling of Stress : Not on file  Social Connections:   . Frequency of Communication with Friends and Family: Not on file  . Frequency of Social Gatherings with Friends and Family: Not on file  . Attends Religious Services: Not on file  . Active Member of Clubs or Organizations: Not on file  . Attends Banker Meetings: Not on file  . Marital Status: Not on file    Allergies: No Known Allergies  Metabolic Disorder Labs: Lab Results  Component Value Date   HGBA1C 5.1 08/27/2019   No results found for: PROLACTIN Lab Results  Component Value Date   CHOL 215 (H) 08/27/2019   TRIG 271.0 (H) 08/27/2019   HDL 45.70 08/27/2019   CHOLHDL 5 08/27/2019   VLDL 54.2 (H) 08/27/2019   LDLCALC 145 (H) 07/16/2018   LDLCALC 103 (H) 06/30/2015   Lab Results  Component Value Date   TSH 1.42 07/08/2019   TSH 1.18 10/15/2018    Therapeutic Level Labs: No results found for: LITHIUM No results found for: VALPROATE No components found for:  CBMZ  Current Medications: Current Outpatient Medications  Medication Sig Dispense Refill  . metoprolol succinate (TOPROL-XL) 50 MG 24 hr tablet Take 0.5 tablets (25 mg total) by mouth daily. Take with or immediately following a meal. 45 tablet 1  . nortriptyline (PAMELOR) 25 MG capsule Take 1 capsule (25 mg  total) by mouth at bedtime. 90 capsule 3  . ondansetron (ZOFRAN) 4 MG tablet Take 1 tablet (4 mg total) by mouth every 8 (eight) hours as needed. 20 tablet 0  . Oxcarbazepine (TRILEPTAL) 300 MG tablet Take 3 tablets daily by mouth AM and 3 tablets PM 540 tablet 1  . risperiDONE (RISPERDAL) 1 MG tablet Take 0.5 tablets (0.5 mg total) by mouth at bedtime. 45 tablet 1   No current facility-administered medications for this visit.     Musculoskeletal: Strength & Muscle Tone: UTA Gait & Station: normal Patient leans: N/A  Psychiatric Specialty Exam: Review of Systems  Psychiatric/Behavioral: Negative for agitation, behavioral problems, confusion, decreased concentration, dysphoric mood, hallucinations, self-injury, sleep disturbance and suicidal ideas. The patient is not nervous/anxious and is not  hyperactive.   All other systems reviewed and are negative.   There were no vitals taken for this visit.There is no height or weight on file to calculate BMI.  General Appearance: Casual  Eye Contact:  Fair  Speech:  Clear and Coherent  Volume:  Normal  Mood:  Euthymic  Affect:  Congruent  Thought Process:  Goal Directed and Descriptions of Associations: Intact  Orientation:  Full (Time, Place, and Person)  Thought Content: Logical   Suicidal Thoughts:  No  Homicidal Thoughts:  No  Memory:  Immediate;   Fair Recent;   Fair Remote;   Fair  Judgement:  Fair  Insight:  Fair  Psychomotor Activity:  Normal  Concentration:  Concentration: Fair and Attention Span: Fair  Recall:  [employer] of Knowledge: Fair  Language: Fair  Akathisia:  No  Handed:  Right  AIMS (if indicated): UTA  Assets:  Communication Skills Desire for Improvement Housing Social Support  ADL's:  Intact  Cognition: WNL  Sleep:  Fair   Screenings: GAD-7     Counselor from 08/12/2019 in [facility] Office Visit from 07/08/2018 in [facility]  Total GAD-7 Score 18 8     PHQ2-9     Counselor from 08/12/2019 in [facility] Office Visit from 07/08/2018 in [facility] Total Score 2 3  PHQ-9 Total Score 13 15       Assessment and Plan: Jill Sandoval is a 29 year old Caucasian female, married, lives in [place], unemployed, has a history of bipolar disorder type II, panic attacks, tobacco use disorder was evaluated by telemedicine today.  Patient is biologically predisposed given her history of trauma, family history.  Patient with psychosocial stressors of the pandemic however currently is stable on medications.  Plan as noted below.  Plan Bipolar disorder type II in remission Reduce risperidone to 0.5 mg p.o. nightly Continue Trileptal 900 mg p.o. twice daily   Panic attacks-stable Monitor closely. Continue Pamelor as prescribed by primary neurologist.  I have reviewed the following labs-CBC, CMP, hemoglobin A1c, TSH, -dated 08/27/2019-within normal limits.  Lipid panel-08/27/2019-abnormal.  She will continue to follow-up with her primary provider.  Follow-up in clinic in 3 months or sooner if needed.  I have spent atleast 20 minutes face to face by video with patient today. More than 50 % of the time was spent for preparing to see the patient ( e.g., review of test, records ), ordering medications and test ,psychoeducation and supportive psychotherapy and care coordination,as well as documenting clinical information in electronic health record,interpreting and communication of test results This note was generated in part or whole with voice recognition software. Voice recognition is usually quite accurate but there are transcription errors that can and very often do occur. I apologize for any typographical errors that were not detected and corrected.      Jomarie Longs, MD 12/02/2019, 5:26 PM

## 2019-12-03 ENCOUNTER — Telehealth: Payer: Self-pay

## 2019-12-03 NOTE — Telephone Encounter (Signed)
received a fax requesting a 90 day supply for the risperdal 1mg     risperiDONE (RISPERDAL) 1 MG tablet Medication Date: 12/02/2019 Department: [facility] Ordering/Authorizing: [facility], MD  Order Providers  Prescribing Provider Encounter Provider  Jomarie Longs, MD Jomarie Longs, MD  Outpatient Medication Detail   Disp Refills Start End   risperiDONE (RISPERDAL) 1 MG tablet 45 tablet 1 12/02/2019    Sig - Route: Take 0.5 tablets (0.5 mg total) by mouth at bedtime. - Oral   Sent to pharmacy as: risperiDONE (RISPERDAL) 1 MG tablet   E-Prescribing Status: Receipt confirmed by pharmacy (12/02/2019 4:34 PM EDT)   Associated Diagnoses  Bipolar disorder, in full remission, most recent episode depressed [facility]) - Primary   type 2    Pharmacy  [facility] [id number], [place] - [place] [address]

## 2019-12-03 NOTE — Telephone Encounter (Signed)
Please let pharmacy know that it was a 90 days supply that was sent out yesterday - her dose was reduced and she is supposed to take 0.5 mg ( half tablet of 1 mg ) only.

## 2020-02-03 ENCOUNTER — Telehealth (INDEPENDENT_AMBULATORY_CARE_PROVIDER_SITE_OTHER): Payer: Self-pay | Admitting: Psychiatry

## 2020-02-03 ENCOUNTER — Other Ambulatory Visit: Payer: Self-pay

## 2020-02-03 ENCOUNTER — Encounter: Payer: Self-pay | Admitting: Psychiatry

## 2020-02-03 DIAGNOSIS — F3176 Bipolar disorder, in full remission, most recent episode depressed: Secondary | ICD-10-CM

## 2020-02-03 DIAGNOSIS — F410 Panic disorder [episodic paroxysmal anxiety] without agoraphobia: Secondary | ICD-10-CM

## 2020-02-03 MED ORDER — RISPERIDONE 0.25 MG PO TABS: 0.2500 mg | ORAL_TABLET | Freq: Every day | ORAL | 0 refills | Status: DC

## 2020-02-03 NOTE — Progress Notes (Signed)
Virtual Visit via Video Note  I connected with Jill Sandoval on 02/03/20 at  4:00 PM EST by a video enabled telemedicine application and verified that I am speaking with the correct person using two identifiers.  Location Provider Location : [facility] Patient Location : Home  Participants: Patient , Provider   I discussed the limitations of evaluation and management by telemedicine and the availability of in person appointments. The patient expressed understanding and agreed to proceed.   I discussed the assessment and treatment plan with the patient. The patient was provided an opportunity to ask questions and all were answered. The patient agreed with the plan and demonstrated an understanding of the instructions.   The patient was advised to call back or seek an in-person evaluation if the symptoms worsen or if the condition fails to improve as anticipated.   Jill Sandoval OP Progress Note  02/03/2020 4:00 PM Jill Sandoval  MRN:  [id number]  Chief Complaint:  Chief Complaint    Follow-up     HPI: Jill Sandoval is a 30 year old Caucasian female, married, currently unemployed, lives in [place], has a history of bipolar disorder type II, asthma, panic attacks, was evaluated by telemedicine today.  Patient reports she had a good holiday season.  She reports she was able to take a break to the beach and that helped.  Patient reports she is tolerating the lower dosage of risperidone and has not noticed any changes in her mood.  She denies any depressive symptoms, anxiety symptoms irritability or mood swings.  She reports sleep as good.  She reports appetite as fair.  She continues to be compliant on the Trileptal.  Denies side effects.  Patient denies any suicidality, homicidality or perceptual disturbances.  Patient denies any other concerns today.  Visit Diagnosis:    ICD-10-CM   1. Bipolar disorder, in full remission, most recent episode depressed (Jill Sandoval)  Jill Sandoval risperiDONE  (RISPERDAL) 0.25 MG tablet   type 2  2. Panic attacks  Jill Sandoval     Past Psychiatric History: I have reviewed past psychiatric history from my progress note from 10/15/2018.  Past trials of Celexa, Trileptal.  Past Medical History:  Past Medical History:  Diagnosis Date  . Anxiety and depression   . Asthma    inhaler prn  . Fetal demise > 22 weeks, delivered, current hospitalization 11/04/2015  . Miscarriage     Past Surgical History:  Procedure Laterality Date  . CESAREAN SECTION N/A 03/24/2012   Procedure: CESAREAN SECTION;  Surgeon: Sharene Butters, Sandoval;  Location: [place] [facility];  Service: Obstetrics;  Laterality: N/A;  . CESAREAN SECTION N/A 09/24/2016   Procedure: CESAREAN SECTION;  Surgeon: Vanessa Kick, Sandoval;  Location: [place];  Service: Obstetrics;  Laterality: N/A;    Family Psychiatric History: I have reviewed family psychiatric history from my progress note on 10/15/2018.  Family History:  Family History  Problem Relation Age of Onset  . Hypertension Maternal Grandmother   . Prostate cancer Maternal Grandfather   . Hearing loss Brother   . Hearing loss Paternal Grandfather   . Hypertension Father   . Other Neg Hx     Social History: I have reviewed social history from my progress note on 10/15/2018. Social History   Socioeconomic History  . Marital status: Married    Spouse name: Not on file  . Number of children: 2  . Years of education: Not on file  . Highest education level: Not on file  Occupational History  .  Not on file  Tobacco Use  . Smoking status: Former Smoker    Packs/day: 0.25    Types: Cigarettes    Quit date: 11/14/2018    Years since quitting: 1.2  . Smokeless tobacco: Never Used  Vaping Use  . Vaping Use: Some days  Substance and Sexual Activity  . Alcohol use: No  . Drug use: No  . Sexual activity: Yes  Other Topics Concern  . Not on file  Social History Narrative   Married.   2 children.   Works at [employer].   Enjoys camping,  being outdoors.    Social Determinants of Health   Financial Resource Strain: Not on file  Food Insecurity: Not on file  Transportation Needs: Not on file  Physical Activity: Not on file  Stress: Not on file  Social Connections: Not on file    Allergies: No Known Allergies  Metabolic Disorder Labs: Lab Results  Component Value Date   HGBA1C 5.1 08/27/2019   No results found for: PROLACTIN Lab Results  Component Value Date   CHOL 215 (H) 08/27/2019   TRIG 271.0 (H) 08/27/2019   HDL 45.70 08/27/2019   CHOLHDL 5 08/27/2019   VLDL 54.2 (H) 08/27/2019   LDLCALC 145 (H) 07/16/2018   LDLCALC 103 (H) 06/30/2015   Lab Results  Component Value Date   TSH 1.42 07/08/2019   TSH 1.18 10/15/2018    Therapeutic Level Labs: No results found for: LITHIUM No results found for: VALPROATE No components found for:  CBMZ  Current Medications: Current Outpatient Medications  Medication Sig Dispense Refill  . risperiDONE (RISPERDAL) 0.25 MG tablet Take 1 tablet (0.25 mg total) by mouth at bedtime. Stop taking after 14 days 14 tablet 0  . metoprolol succinate (TOPROL-XL) 50 MG 24 hr tablet Take 0.5 tablets (25 mg total) by mouth daily. Take with or immediately following a meal. 45 tablet 1  . nortriptyline (PAMELOR) 25 MG capsule Take 1 capsule (25 mg total) by mouth at bedtime. 90 capsule 3  . ondansetron (ZOFRAN) 4 MG tablet Take 1 tablet (4 mg total) by mouth every 8 (eight) hours as needed. 20 tablet 0  . Oxcarbazepine (TRILEPTAL) 300 MG tablet Take 3 tablets daily by mouth AM and 3 tablets PM 540 tablet 1   No current facility-administered medications for this visit.     Musculoskeletal: Strength & Muscle Tone: UTA Gait & Station: UTA Patient leans: N/A  Psychiatric Specialty Exam: Review of Systems  Psychiatric/Behavioral: Negative for agitation, behavioral problems, confusion, decreased concentration, dysphoric mood, hallucinations, self-injury, sleep disturbance and  suicidal ideas. The patient is not nervous/anxious and is not hyperactive.   All other systems reviewed and are negative.   There were no vitals taken for this visit.There is no height or weight on file to calculate BMI.  General Appearance: Casual  Eye Contact:  Fair  Speech:  Clear and Coherent  Volume:  Normal  Mood:  Euthymic  Affect:  Congruent  Thought Process:  Goal Directed and Descriptions of Associations: Intact  Orientation:  Full (Time, Place, and Person)  Thought Content: Logical   Suicidal Thoughts:  No  Homicidal Thoughts:  No  Memory:  Immediate;   Fair Recent;   Fair Remote;   Fair  Judgement:  Fair  Insight:  Fair  Psychomotor Activity:  Normal  Concentration:  Concentration: Fair and Attention Span: Fair  Recall:  [employer] of Knowledge: Fair  Language: Fair  Akathisia:  No  Handed:  Right  AIMS (if indicated): UTA  Assets:  Communication Skills Desire for Improvement Housing Intimacy Social Support Talents/Skills Transportation Vocational/Educational  ADL's:  Intact  Cognition: WNL  Sleep:  Fair   Screenings: GAD-7   Advertising copywriter from 08/12/2019 in [facility] Office Visit from 07/08/2018 in [facility]  Total GAD-7 Score 18 8    PHQ2-9   Flowsheet Row Counselor from 08/12/2019 in [facility] Office Visit from 07/08/2018 in [facility] Total Score 2 3  PHQ-9 Total Score 13 15       Assessment and Plan: Jill Sandoval is a 30 year old Caucasian female, married, lives in [place], unemployed, has a history of bipolar disorder type II, panic attacks, tobacco use disorder was evaluated by telemedicine today.  Patient is biologically predisposed given her history of trauma, family history.  Patient with psychosocial stressors of pandemic.  Patient is currently stable on medication and tolerating the lower dosage of risperidone well.   Plan as noted below.  Plan Bipolar disorder type II in remission Reduce risperidone to 0.25 mg p.o. daily.  Advised patient to stop taking in 14 days. She will monitor her symptoms closely and if she notices any worsening mood symptoms she will restart taking risperidone 0.5 mg p.o. daily.  She could also make a sooner appointment to be reevaluated. Continue Trileptal 900 mg p.o. twice daily  Panic attacks-stable Monitor closely Continue Pamelor as prescribed by her neurologist.  Follow-up in clinic in 6 to 8 weeks or sooner if needed.  I have spent atleast 20 minutes face to face by video with patient today. More than 50 % of the time was spent for preparing to see the patient ( e.g., review of test, records ),ordering medications and test ,psychoeducation and supportive psychotherapy and care coordination,as well as documenting clinical information in electronic health record. This note was generated in part or whole with voice recognition software. Voice recognition is usually quite accurate but there are transcription errors that can and very often do occur. I apologize for any typographical errors that were not detected and corrected.        Jomarie Longs, Sandoval 02/04/2020, 9:43 AM

## 2020-02-29 DIAGNOSIS — Z03818 Encounter for observation for suspected exposure to other biological agents ruled out: Secondary | ICD-10-CM | POA: Diagnosis not present

## 2020-02-29 DIAGNOSIS — Z20822 Contact with and (suspected) exposure to covid-19: Secondary | ICD-10-CM | POA: Diagnosis not present

## 2020-03-29 ENCOUNTER — Other Ambulatory Visit: Payer: Self-pay

## 2020-03-29 ENCOUNTER — Encounter: Payer: Self-pay | Admitting: Psychiatry

## 2020-03-29 ENCOUNTER — Telehealth (INDEPENDENT_AMBULATORY_CARE_PROVIDER_SITE_OTHER): Payer: BC Managed Care – PPO | Admitting: Psychiatry

## 2020-03-29 DIAGNOSIS — F411 Generalized anxiety disorder: Secondary | ICD-10-CM | POA: Insufficient documentation

## 2020-03-29 DIAGNOSIS — F410 Panic disorder [episodic paroxysmal anxiety] without agoraphobia: Secondary | ICD-10-CM

## 2020-03-29 DIAGNOSIS — F3181 Bipolar II disorder: Secondary | ICD-10-CM | POA: Diagnosis not present

## 2020-03-29 MED ORDER — RISPERIDONE 0.5 MG PO TABS: 0.5000 mg | ORAL_TABLET | Freq: Every day | ORAL | 1 refills | Status: DC

## 2020-03-29 NOTE — Progress Notes (Signed)
Virtual Visit via Video Note  I connected with Jill Sandoval on 03/29/20 at  4:00 PM EST by a video enabled telemedicine application and verified that I am speaking with the correct person using two identifiers.  Location Provider Location : [facility] Patient Location : Home  Participants: Patient , Provider   I discussed the limitations of evaluation and management by telemedicine and the availability of in person appointments. The patient expressed understanding and agreed to proceed.   I discussed the assessment and treatment plan with the patient. The patient was provided an opportunity to ask questions and all were answered. The patient agreed with the plan and demonstrated an understanding of the instructions.   The patient was advised to call back or seek an in-person evaluation if the symptoms worsen or if the condition fails to improve as anticipated.   BH MD OP Progress Note  03/29/2020 8:48 PM LEKIA NIER  MRN:  [id number]  Chief Complaint:  Chief Complaint    Follow-up; Anxiety; Depression     HPI: Jill Sandoval is a 30 year old Caucasian female, married, currently unemployed, lives in [place], has a history of bipolar disorder type II, asthma, panic attacks was evaluated by telemedicine today.  Patient today reports she has been struggling with sadness, lack of motivation, low energy, mood swings, irritability since the past few weeks.  She also reports panic attacks.  She reports she feels chest pressure often when she feels that way.  She reports when she feel anxious she has to separate herself from her stressors and that helps.  So far she has been able to cope with it without using any as needed medication.  She also reports she has started worrying a lot.  She reports since the past 2 weeks she has been worrying about different things.  She has trouble relaxing.  She is constantly restless and anxious.  Patient reports she is trying to diet and so far it is  going well.  Patient reports sleep as restless.  She reports she wakes up several times at night and feels sluggish the next day due to lack of sleep.  This has been getting worse since the past few weeks.  Patient denies any suicidality, homicidality or perceptual disturbances.  Patient denies any other concerns today.    Visit Diagnosis:    ICD-10-CM   1. Bipolar 2 disorder, major depressive episode (HCC)  F31.81 risperiDONE (RISPERDAL) 0.5 MG tablet   mild  2. Panic attacks  F41.0     Past Psychiatric History: I have past psychiatric history from my progress note on 10/15/2018.  Past trials of Celexa, Trileptal.  Past Medical History:  Past Medical History:  Diagnosis Date  . Anxiety and depression   . Asthma    inhaler prn  . Fetal demise > 22 weeks, delivered, current hospitalization 11/04/2015  . Miscarriage     Past Surgical History:  Procedure Laterality Date  . CESAREAN SECTION N/A 03/24/2012   Procedure: CESAREAN SECTION;  Surgeon: Mickel Baas, MD;  Location: [facility];  Service: Obstetrics;  Laterality: N/A;  . CESAREAN SECTION N/A 09/24/2016   Procedure: CESAREAN SECTION;  Surgeon: Waynard Reeds, MD;  Location: [facility];  Service: Obstetrics;  Laterality: N/A;    Family Psychiatric History: I have reviewed family psychiatric history from my progress note on 10/15/2018  Family History:  Family History  Problem Relation Age of Onset  . Hypertension Maternal Grandmother   . Prostate cancer Maternal Grandfather   .  Hearing loss Brother   . Hearing loss Paternal Grandfather   . Hypertension Father   . Other Neg Hx     Social History: Reviewed social history from my progress note on 10/15/2018 Social History   Socioeconomic History  . Marital status: Married    Spouse name: Not on file  . Number of children: 2  . Years of education: Not on file  . Highest education level: Not on file  Occupational History  . Not on file  Tobacco Use  . Smoking  status: Former Smoker    Packs/day: 0.25    Types: Cigarettes    Quit date: 11/14/2018    Years since quitting: 1.3  . Smokeless tobacco: Never Used  Vaping Use  . Vaping Use: Some days  Substance and Sexual Activity  . Alcohol use: No  . Drug use: No  . Sexual activity: Yes  Other Topics Concern  . Not on file  Social History Narrative   Married.   2 children.   Works at [employer].   Enjoys camping, being outdoors.    Social Determinants of Health   Financial Resource Strain: Not on file  Food Insecurity: Not on file  Transportation Needs: Not on file  Physical Activity: Not on file  Stress: Not on file  Social Connections: Not on file    Allergies: No Known Allergies  Metabolic Disorder Labs: Lab Results  Component Value Date   HGBA1C 5.1 08/27/2019   No results found for: PROLACTIN Lab Results  Component Value Date   CHOL 215 (H) 08/27/2019   TRIG 271.0 (H) 08/27/2019   HDL 45.70 08/27/2019   CHOLHDL 5 08/27/2019   VLDL 54.2 (H) 08/27/2019   LDLCALC 145 (H) 07/16/2018   LDLCALC 103 (H) 06/30/2015   Lab Results  Component Value Date   TSH 1.42 07/08/2019   TSH 1.18 10/15/2018    Therapeutic Level Labs: No results found for: LITHIUM No results found for: VALPROATE No components found for:  CBMZ  Current Medications: Current Outpatient Medications  Medication Sig Dispense Refill  . nortriptyline (PAMELOR) 25 MG capsule Take 25 mg by mouth at bedtime.    . ondansetron (ZOFRAN) 4 MG tablet Take 1 tablet (4 mg total) by mouth every 8 (eight) hours as needed. 20 tablet 0  . Oxcarbazepine (TRILEPTAL) 300 MG tablet Take 3 tablets daily by mouth AM and 3 tablets PM 540 tablet 1  . risperiDONE (RISPERDAL) 0.5 MG tablet Take 1 tablet (0.5 mg total) by mouth at bedtime. 30 tablet 1  . metoprolol succinate (TOPROL-XL) 50 MG 24 hr tablet Take 0.5 tablets (25 mg total) by mouth daily. Take with or immediately following a meal. 45 tablet 1   No current  facility-administered medications for this visit.     Musculoskeletal: Strength & Muscle Tone: UTA Gait & Station: UTA Patient leans: N/A  Psychiatric Specialty Exam: Review of Systems  Psychiatric/Behavioral: Positive for dysphoric mood and sleep disturbance. The patient is nervous/anxious.   All other systems reviewed and are negative.   There were no vitals taken for this visit.There is no height or weight on file to calculate BMI.  General Appearance: Casual  Eye Contact:  Fair  Speech:  Clear and Coherent  Volume:  Normal  Mood:  Anxious, Depressed, Dysphoric and Irritable  Affect:  Congruent  Thought Process:  Goal Directed and Descriptions of Associations: Intact  Orientation:  Full (Time, Place, and Person)  Thought Content: Logical   Suicidal Thoughts:  No  Homicidal Thoughts:  No  Memory:  Immediate;   Fair Recent;   Fair Remote;   Fair  Judgement:  Fair  Insight:  Fair  Psychomotor Activity:  Normal  Concentration:  Concentration: Fair and Attention Span: Fair  Recall:  [employer] of Knowledge: Fair  Language: Fair  Akathisia:  No  Handed:  Right  AIMS (if indicated): not done  Assets:  Communication Skills Desire for Improvement Housing Intimacy Social Support  ADL's:  Intact  Cognition: WNL  Sleep:  Poor   Screenings: GAD-7   Flowsheet Row Video Visit from 03/29/2020 in [facility] Counselor from 08/12/2019 in [facility] Office Visit from 07/08/2018 in [facility]  Total GAD-7 Score 18 18 8     PHQ2-9   Flowsheet Row Counselor from [facility]/12/[facility] in [facility] Office Visit from 07/08/2018 in [facility]  PHQ-2 Total Score 2 3  PHQ-9 Total Score 13 15       Assessment and Plan: NATALEY BAHRI is a 30 year old Caucasian female, married, lives in [place], unemployed, has a history of bipolar disorder type II, panic attacks,  tobacco use disorder was evaluated by telemedicine today.  Patient is biologically predisposed given her history of trauma, family history.  Patient with psychosocial stressors of pandemic.  Patient is currently struggling with mood lability, sleep problems since being tapered down on the risperidone.  Discussed plan as noted below.  Plan Bipolar disorder type II-unstable Restart risperidone 0.5 mg p.o. daily. Continue Trileptal 900 mg p.o. twice daily  Panic attacks-unstable Continue Pamelor as prescribed by neurology. Patient to restart CBT.  She will schedule an appointment with therapist.  Follow-up in clinic in 2 to 3 weeks or sooner if needed.   I have spent at least 20 minutes with patient today, along with time spent for preparing to see the patient  (e.g., review of test, records), ordering medications and test, psychoeducation and supportive psychotherapy, care coordination as well as documenting clinical information in electronic health record.   This note was generated in part or whole with voice recognition software. Voice recognition is usually quite accurate but there are transcription errors that can and very often do occur. I apologize for any typographical errors that were not detected and corrected.      Rossano Stazione, MD 03/30/2020, 5:27 PM

## 2020-04-18 ENCOUNTER — Encounter: Payer: Self-pay | Admitting: Psychiatry

## 2020-04-18 ENCOUNTER — Other Ambulatory Visit: Payer: Self-pay

## 2020-04-18 ENCOUNTER — Telehealth (INDEPENDENT_AMBULATORY_CARE_PROVIDER_SITE_OTHER): Payer: BC Managed Care – PPO | Admitting: Psychiatry

## 2020-04-18 DIAGNOSIS — F410 Panic disorder [episodic paroxysmal anxiety] without agoraphobia: Secondary | ICD-10-CM | POA: Diagnosis not present

## 2020-04-18 DIAGNOSIS — F3181 Bipolar II disorder: Secondary | ICD-10-CM

## 2020-04-18 MED ORDER — RISPERIDONE 0.5 MG PO TABS: 0.7500 mg | ORAL_TABLET | Freq: Every day | ORAL | 1 refills | Status: DC

## 2020-04-18 NOTE — Progress Notes (Signed)
Virtual Visit via Video Note  I connected with Jill Sandoval on 04/19/20 at  4:00 PM EDT by a video enabled telemedicine application and verified that I am speaking with the correct person using two identifiers.  Location Provider Location : [facility] Patient Location : Home  Participants: Patient , Provider    I discussed the limitations of evaluation and management by telemedicine and the availability of in person appointments. The patient expressed understanding and agreed to proceed.   I discussed the assessment and treatment plan with the patient. The patient was provided an opportunity to ask questions and all were answered. The patient agreed with the plan and demonstrated an understanding of the instructions.   The patient was advised to call back or seek an in-person evaluation if the symptoms worsen or if the condition fails to improve as anticipated.   BH MD OP Progress Note  04/18/2020 4:19 PM Jill Sandoval  MRN:  [id number]  Chief Complaint:  Chief Complaint    Follow-up; Depression; Anxiety     HPI: Jill Sandoval is a 30 year old Caucasian female, married, currently lives in [place] has a history of bipolar disorder type II, was evaluated by telemedicine today.  Patient today reports she is currently making progress however continues to struggle with irritability and anxiety.  When she has anxiety she has chest tightness.  She reports since being back on the risperidone it has improved.  She is interested in increasing the dosage further.  Patient denies any suicidality or homicidality or perceptual disturbances.  Patient reports sleep is improving.  She is compliant with medications.  Patient denies any other concerns.     Visit Diagnosis:    ICD-10-CM   1. Bipolar 2 disorder, major depressive episode (HCC)  F31.81 risperiDONE (RISPERDAL) 0.5 MG tablet  2. Panic attacks  F41.0 risperiDONE (RISPERDAL) 0.5 MG tablet    Past Psychiatric History: I have  reviewed past psychiatric history from my progress note from 10/15/2018.  Past trials of Celexa, Trileptal  Past Medical History:  Past Medical History:  Diagnosis Date  . Anxiety and depression   . Asthma    inhaler prn  . Fetal demise > 22 weeks, delivered, current hospitalization 11/04/2015  . Miscarriage     Past Surgical History:  Procedure Laterality Date  . CESAREAN SECTION N/A 03/24/2012   Procedure: CESAREAN SECTION;  Surgeon: Mickel Baas, MD;  Location: [facility];  Service: Obstetrics;  Laterality: N/A;  . CESAREAN SECTION N/A 09/24/2016   Procedure: CESAREAN SECTION;  Surgeon: Waynard Reeds, MD;  Location: [facility] BIRTHING SUITES;  Service: Obstetrics;  Laterality: N/A;    Family Psychiatric History: I have reviewed family psychiatric history from my progress note on 10/15/2018  Family History:  Family History  Problem Relation Age of Onset  . Hypertension Maternal Grandmother   . Prostate cancer Maternal Grandfather   . Hearing loss Brother   . Hearing loss Paternal Grandfather   . Hypertension Father   . Other Neg Hx     Social History: I have reviewed social history from my progress note on 10/15/2018 Social History   Socioeconomic History  . Marital status: Married    Spouse name: Not on file  . Number of children: 2  . Years of education: Not on file  . Highest education level: Not on file  Occupational History  . Not on file  Tobacco Use  . Smoking status: Former Smoker    Packs/day: 0.25    Types: Cigarettes  Quit date: 11/14/2018    Years since quitting: 1.4  . Smokeless tobacco: Never Used  Vaping Use  . Vaping Use: Some days  Substance and Sexual Activity  . Alcohol use: No  . Drug use: No  . Sexual activity: Yes  Other Topics Concern  . Not on file  Social History Narrative   Married.   2 children.   Works at [employer].   Enjoys camping, being outdoors.    Social Determinants of Health   Financial Resource Strain: Not on file  Food  Insecurity: Not on file  Transportation Needs: Not on file  Physical Activity: Not on file  Stress: Not on file  Social Connections: Not on file    Allergies: No Known Allergies  Metabolic Disorder Labs: Lab Results  Component Value Date   HGBA1C 5.1 08/27/2019   No results found for: PROLACTIN Lab Results  Component Value Date   CHOL 215 (H) 08/27/2019   TRIG 271.0 (H) 08/27/2019   HDL 45.70 08/27/2019   CHOLHDL 5 08/27/2019   VLDL 54.2 (H) 08/27/2019   LDLCALC 145 (H) 07/16/2018   LDLCALC 103 (H) 06/30/2015   Lab Results  Component Value Date   TSH 1.42 07/08/2019   TSH 1.18 10/15/2018    Therapeutic Level Labs: No results found for: LITHIUM No results found for: VALPROATE No components found for:  CBMZ  Current Medications: Current Outpatient Medications  Medication Sig Dispense Refill  . risperiDONE (RISPERDAL) 0.5 MG tablet Take 1.5 tablets (0.75 mg total) by mouth at bedtime. 45 tablet 1  . metoprolol succinate (TOPROL-XL) 50 MG 24 hr tablet Take 0.5 tablets (25 mg total) by mouth daily. Take with or immediately following a meal. 45 tablet 1  . nortriptyline (PAMELOR) 25 MG capsule Take 25 mg by mouth at bedtime.    . ondansetron (ZOFRAN) 4 MG tablet Take 1 tablet (4 mg total) by mouth every 8 (eight) hours as needed. 20 tablet 0  . Oxcarbazepine (TRILEPTAL) 300 MG tablet Take 3 tablets daily by mouth AM and 3 tablets PM 540 tablet 1   No current facility-administered medications for this visit.     Musculoskeletal: Strength & Muscle Tone: UTA Gait & Station: UTA Patient leans: N/A  Psychiatric Specialty Exam: Review of Systems  Psychiatric/Behavioral: The patient is nervous/anxious.        Mood swings - better   All other systems reviewed and are negative.   There were no vitals taken for this visit.There is no height or weight on file to calculate BMI.  General Appearance: Casual  Eye Contact:  Fair  Speech:  Clear and Coherent  Volume:  Normal   Mood:  Anxious  Affect:  Congruent  Thought Process:  Goal Directed and Descriptions of Associations: Intact  Orientation:  Full (Time, Place, and Person)  Thought Content: Logical   Suicidal Thoughts:  No  Homicidal Thoughts:  No  Memory:  Immediate;   Fair Recent;   Fair Remote;   Fair  Judgement:  Fair  Insight:  Fair  Psychomotor Activity:  Normal  Concentration:  Concentration: Fair and Attention Span: Fair  Recall:  [employer] of Knowledge: Fair  Language: Fair  Akathisia:  No  Handed:  Right  AIMS (if indicated): UTA  Assets:  Communication Skills Desire for Improvement Housing Intimacy Social Support  ADL's:  Intact  Cognition: WNL  Sleep:  Fair   Screenings: GAD-7   Flowsheet Row Video Visit from 03/29/2020 in [facility]  Counselor from 08/12/2019 in [facility] Office Visit from 07/08/2018 in [facility]  Total GAD-7 Score 18 18 8     PHQ2-9   Flowsheet Row Counselor from 08/12/2019 in [facility] Office Visit from 07/08/2018 in [facility]  PHQ-2 Total Score 2 3  PHQ-9 Total Score 13 15       Assessment and Plan: Jill Sandoval is a 30 year old Caucasian female, married, lives in [place], unemployed, has a history of bipolar disorder type II, panic attacks, tobacco use disorder was evaluated by telepsych today.  Patient with psychosocial stressors of pandemic.  She is currently making progress although continues to struggle with anxiety and mood lability.  Plan as noted below.  Plan Bipolar disorder type II-improving Increase risperidone to 0.75 mg p.o. daily Continue Trileptal 900 mg p.o. twice daily  Panic attacks-improving Continue Pamelor as prescribed by neurology Patient advised to restart CBT and has upcoming appointment.  Follow-up in clinic in 3 to 4 weeks or sooner if needed.  This note was generated in part or whole  with voice recognition software. Voice recognition is usually quite accurate but there are transcription errors that can and very often do occur. I apologize for any typographical errors that were not detected and corrected.      Rossano Stazione, MD 04/19/2020, 9:27 AM

## 2020-04-26 ENCOUNTER — Telehealth: Payer: Self-pay

## 2020-04-26 DIAGNOSIS — F410 Panic disorder [episodic paroxysmal anxiety] without agoraphobia: Secondary | ICD-10-CM

## 2020-04-26 MED ORDER — HYDROXYZINE PAMOATE 25 MG PO CAPS: 25.0000 mg | ORAL_CAPSULE | ORAL | 1 refills | Status: DC

## 2020-04-26 MED ORDER — BUSPIRONE HCL 10 MG PO TABS: 10.0000 mg | ORAL_TABLET | Freq: Two times a day (BID) | ORAL | 1 refills | Status: DC

## 2020-04-26 NOTE — Telephone Encounter (Signed)
Returned call to patient.  She reports she is having back-to-back panic attacks out of nowhere which are very severe.  She denies any current situational stressors.  She reports she stopped taking the metoprolol and currently reports her baseline heart rate as okay.  Patient advised to check back with her primary care provider to get an evaluation to make sure panic attacks are not triggered by anything else and to get a medical clearance.  Discussed adding BuSpar 10 mg twice a day.  She agrees.  Add hydroxyzine 25 to 50 mg as needed for severe panic attacks.  Provided medication education and I have provided information by text message about these medications.  Patient advised to going to the nearest urgent care or emergency department if she is in a crisis.

## 2020-04-26 NOTE — Telephone Encounter (Signed)
pt states she is having a lot of panic attack and that it had gotten so bad that she had to call her dad who works for the fire dept to come and they gave her oxygen.

## 2020-04-27 ENCOUNTER — Encounter: Payer: Self-pay | Admitting: Internal Medicine

## 2020-04-27 ENCOUNTER — Other Ambulatory Visit: Payer: Self-pay

## 2020-04-27 ENCOUNTER — Ambulatory Visit (INDEPENDENT_AMBULATORY_CARE_PROVIDER_SITE_OTHER): Payer: BC Managed Care – PPO | Admitting: Internal Medicine

## 2020-04-27 VITALS — BP 122/82 | HR 120 | Temp 98.8°F | Wt 246.0 lb

## 2020-04-27 DIAGNOSIS — F3181 Bipolar II disorder: Secondary | ICD-10-CM | POA: Diagnosis not present

## 2020-04-27 DIAGNOSIS — F410 Panic disorder [episodic paroxysmal anxiety] without agoraphobia: Secondary | ICD-10-CM | POA: Diagnosis not present

## 2020-04-27 DIAGNOSIS — F411 Generalized anxiety disorder: Secondary | ICD-10-CM | POA: Diagnosis not present

## 2020-04-27 NOTE — Assessment & Plan Note (Signed)
Deteriorated Continue Trileptal and risperidone Advised her to start the BuSpar and hydroxyzine per psych We will check testosterone, FSH, LH, TSH, estrogens, progesterone, serum hCG Encourage CBT Encouraged her to continue to follow-up with her therapist and psychiatrist 

## 2020-04-27 NOTE — Progress Notes (Signed)
Subjective:    Patient ID: Jill Sandoval, female    DOB: 22-Apr-1990, 30 y.o.   MRN: [id number]  HPI  Patient presents the clinic today with complaint of panic attacks.  She reports this started a few weeks ago.  She has no idea what is triggering any of this.  She denies any changes in her activity level.  She has been trying to clean up her diet by cutting out carbs.  She reports both of her kids go to school or daycare so she has plenty of time for herself.  She reports no issues within her marriage.  She is not currently working.  She reports she did start a supplement to ProveIT 2 to 3 months ago.  She reports her panic attacks exhibit themselves as uncontrollable crying, hyperventilating, chest tightness, dizziness.  She denies syncope.  She does have a history of bipolar depression, managed on Trileptal and Risperdal which was recently increased to 1.5 tabs.  She reports she reached out to her psychiatrist yesterday who sent in BuSpar and Hydroxyzine, which she was only able to pick up today.  She is currently seeing a therapist.  She denies SI/HI.   Review of Systems  Past Medical History:  Diagnosis Date  . Anxiety and depression   . Asthma    inhaler prn  . Fetal demise > 22 weeks, delivered, current hospitalization 11/04/2015  . Miscarriage     Current Outpatient Medications  Medication Sig Dispense Refill  . busPIRone (BUSPAR) 10 MG tablet Take 1 tablet (10 mg total) by mouth 2 (two) times daily. 60 tablet 1  . hydrOXYzine (VISTARIL) 25 MG capsule Take 1-2 capsules (25-50 mg total) by mouth as directed. Take for severe panic attacks 60 capsule 1  . metoprolol succinate (TOPROL-XL) 50 MG 24 hr tablet Take 0.5 tablets (25 mg total) by mouth daily. Take with or immediately following a meal. 45 tablet 1  . nortriptyline (PAMELOR) 25 MG capsule Take 25 mg by mouth at bedtime.    . Oxcarbazepine (TRILEPTAL) 300 MG tablet Take 3 tablets daily by mouth AM and 3 tablets PM 540 tablet  1  . risperiDONE (RISPERDAL) 0.5 MG tablet Take 1.5 tablets (0.75 mg total) by mouth at bedtime. 45 tablet 1   No current facility-administered medications for this visit.    No Known Allergies  Family History  Problem Relation Age of Onset  . Hypertension Maternal Grandmother   . Prostate cancer Maternal Grandfather   . Hearing loss Brother   . Hearing loss Paternal Grandfather   . Hypertension Father   . Other Neg Hx     Social History   Socioeconomic History  . Marital status: Married    Spouse name: Not on file  . Number of children: 2  . Years of education: Not on file  . Highest education level: Not on file  Occupational History  . Not on file  Tobacco Use  . Smoking status: Former Smoker    Packs/day: 0.25    Types: Cigarettes    Quit date: 11/14/2018    Years since quitting: 1.4  . Smokeless tobacco: Never Used  Vaping Use  . Vaping Use: Some days  Substance and Sexual Activity  . Alcohol use: No  . Drug use: No  . Sexual activity: Yes  Other Topics Concern  . Not on file  Social History Narrative   Married.   2 children.   Works at [employer].   Enjoys camping, being outdoors.  Social Determinants of Health   Financial Resource Strain: Not on file  Food Insecurity: Not on file  Transportation Needs: Not on file  Physical Activity: Not on file  Stress: Not on file  Social Connections: Not on file  Intimate Partner Violence: Not on file     Constitutional: Denies fever, malaise, fatigue, headache or abrupt weight changes.  HEENT: Denies eye pain, eye redness, ear pain, ringing in the ears, wax buildup, runny nose, nasal congestion, bloody nose, or sore throat. Respiratory: Denies difficulty breathing, shortness of breath, cough or sputum production.   Cardiovascular: Denies chest pain, chest tightness, palpitations or swelling in the hands or feet.  Gastrointestinal: Denies abdominal pain, bloating, constipation, diarrhea or blood in the stool.   GU: Denies urgency, frequency, pain with urination, burning sensation, blood in urine, odor or discharge. Musculoskeletal: Denies decrease in range of motion, difficulty with gait, muscle pain or joint pain and swelling.  Skin: Denies redness, rashes, lesions or ulcercations.  Neurological: Denies dizziness, difficulty with memory, difficulty with speech or problems with balance and coordination.  Psych: Denies anxiety, depression, SI/HI.  No other specific complaints in a complete review of systems (except as listed in HPI above).     Objective:   Physical Exam   There were no vitals taken for this visit. Wt Readings from Last 3 Encounters:  08/27/19 (!) 251 lb (113.9 kg)  08/17/19 252 lb 2 oz (114.4 kg)  07/08/19 249 lb (112.9 kg)    General: Appears their stated age, well developed, well nourished in NAD. Skin: Warm, dry and intact. No rashes, lesions or ulcerations noted. HEENT: Head: normal shape and size; Eyes: sclera white, no icterus, conjunctiva pink, PERRLA and EOMs intact; Ears: Tm's gray and intact, normal light reflex; Nose: mucosa pink and moist, septum midline; Throat/Mouth: Teeth present, mucosa pink and moist, no exudate, lesions or ulcerations noted.  Neck:  Neck supple, trachea midline. No masses, lumps or thyromegaly present.  Cardiovascular: Normal rate and rhythm. S1,S2 noted.  No murmur, rubs or gallops noted. No JVD or BLE edema. No carotid bruits noted. Pulmonary/Chest: Normal effort and positive vesicular breath sounds. No respiratory distress. No wheezes, rales or ronchi noted.  Abdomen: Soft and nontender. Normal bowel sounds. No distention or masses noted. Liver, spleen and kidneys non palpable. Musculoskeletal: Normal range of motion. No signs of joint swelling. No difficulty with gait.  Neurological: Alert and oriented. Cranial nerves II-XII grossly intact. Coordination normal.  Psychiatric: Mood and affect normal. Behavior is normal. Judgment and thought  content normal.   EKG:  BMET    Component Value Date/Time   NA 139 08/27/2019 1520   NA 136 06/30/2015 0000   NA 137 07/28/2011 1558   K 4.4 08/27/2019 1520   K 3.7 07/28/2011 1558   CL 105 08/27/2019 1520   CL 107 07/28/2011 1558   CO2 28 08/27/2019 1520   CO2 22 07/28/2011 1558   GLUCOSE 93 08/27/2019 1520   GLUCOSE 76 07/28/2011 1558   BUN 17 08/27/2019 1520   BUN 9 06/30/2015 0000   BUN 11 07/28/2011 1558   CREATININE 0.94 08/27/2019 1520   CREATININE 0.93 07/28/2011 1558   CALCIUM 9.5 08/27/2019 1520   CALCIUM 9.1 07/28/2011 1558   GFRNONAA 118 06/30/2015 0000   GFRNONAA >60 07/28/2011 1558   GFRAA 136 06/30/2015 0000   GFRAA >60 07/28/2011 1558    Lipid Panel     Component Value Date/Time   CHOL 215 (H) 08/27/2019 1520  CHOL 194 06/30/2015 0000   TRIG 271.0 (H) 08/27/2019 1520   HDL 45.70 08/27/2019 1520   HDL 57 06/30/2015 0000   CHOLHDL 5 08/27/2019 1520   VLDL 54.2 (H) 08/27/2019 1520   LDLCALC 145 (H) 07/16/2018 1036   LDLCALC 103 (H) 06/30/2015 0000    CBC    Component Value Date/Time   WBC 11.0 (H) 08/27/2019 1520   RBC 4.52 08/27/2019 1520   HGB 12.8 08/27/2019 1520   HGB 12.6 06/30/2015 0000   HCT 39.1 08/27/2019 1520   HCT 38.1 06/30/2015 0000   PLT 310.0 08/27/2019 1520   PLT 320 06/30/2015 0000   MCV 86.5 08/27/2019 1520   MCV 84 06/30/2015 0000   MCV 89 07/28/2011 1558   MCH 27.9 09/25/2016 0454   MCHC 32.7 08/27/2019 1520   RDW 13.2 08/27/2019 1520   RDW 13.9 06/30/2015 0000   RDW 13.3 07/28/2011 1558   LYMPHSABS 2.4 06/30/2015 0000   EOSABS 0.2 06/30/2015 0000   BASOSABS 0.0 06/30/2015 0000    Hgb A1C Lab Results  Component Value Date   HGBA1C 5.1 08/27/2019           Assessment & Plan:    Nicki Reaper, NP This visit occurred during the SARS-CoV-2 public health emergency.  Safety protocols were in place, including screening questions prior to the visit, additional usage of staff PPE, and extensive cleaning of  exam room while observing appropriate contact time as indicated for disinfecting solutions.

## 2020-04-27 NOTE — Patient Instructions (Signed)

## 2020-04-27 NOTE — Assessment & Plan Note (Signed)
Deteriorated Continue Trileptal and risperidone Advised her to start the BuSpar and hydroxyzine per psych We will check testosterone, FSH, LH, TSH, estrogens, progesterone, serum hCG Encourage CBT Encouraged her to continue to follow-up with her therapist and psychiatrist

## 2020-04-28 DIAGNOSIS — Z3046 Encounter for surveillance of implantable subdermal contraceptive: Secondary | ICD-10-CM | POA: Diagnosis not present

## 2020-04-28 DIAGNOSIS — Z6841 Body Mass Index (BMI) 40.0 and over, adult: Secondary | ICD-10-CM | POA: Diagnosis not present

## 2020-04-28 LAB — TESTOSTERONE: Testosterone: 56.61 ng/dL — ABNORMAL HIGH (ref 15.00–40.00)

## 2020-04-28 LAB — TSH: TSH: 1.34 u[IU]/mL (ref 0.35–4.50)

## 2020-04-28 LAB — FOLLICLE STIMULATING HORMONE: FSH: 9.9 m[IU]/mL

## 2020-04-28 LAB — LUTEINIZING HORMONE: LH: 4.3 m[IU]/mL

## 2020-04-30 ENCOUNTER — Encounter: Payer: Self-pay | Admitting: Internal Medicine

## 2020-05-01 LAB — PROGESTERONE: Progesterone: 0.6 ng/mL

## 2020-05-01 LAB — HCG, SERUM, QUALITATIVE: Preg, Serum: NEGATIVE

## 2020-05-01 LAB — ESTROGENS, TOTAL: Estrogen: 139.4 pg/mL

## 2020-05-03 ENCOUNTER — Encounter: Payer: Self-pay | Admitting: Psychiatry

## 2020-05-03 ENCOUNTER — Other Ambulatory Visit: Payer: Self-pay

## 2020-05-03 ENCOUNTER — Telehealth (INDEPENDENT_AMBULATORY_CARE_PROVIDER_SITE_OTHER): Payer: BC Managed Care – PPO | Admitting: Psychiatry

## 2020-05-03 DIAGNOSIS — F410 Panic disorder [episodic paroxysmal anxiety] without agoraphobia: Secondary | ICD-10-CM | POA: Diagnosis not present

## 2020-05-03 DIAGNOSIS — F3181 Bipolar II disorder: Secondary | ICD-10-CM

## 2020-05-03 NOTE — Progress Notes (Signed)
Virtual Visit via Video Note  I connected with Jill Sandoval on 05/03/20 at 10:00 AM EDT by a video enabled telemedicine application and verified that I am speaking with the correct person using two identifiers.  Location Provider Location : [facility] Patient Location : Home  Participants: Patient , Provider   I discussed the limitations of evaluation and management by telemedicine and the availability of in person appointments. The patient expressed understanding and agreed to proceed.   I discussed the assessment and treatment plan with the patient. The patient was provided an opportunity to ask questions and all were answered. The patient agreed with the plan and demonstrated an understanding of the instructions.   The patient was advised to call back or seek an in-person evaluation if the symptoms worsen or if the condition fails to improve as anticipated. [facility] MD OP Progress Note  05/03/2020 1:13 PM Jill Sandoval  MRN:  [id number]  Chief Complaint:  Chief Complaint    Follow-up; Depression; Anxiety     HPI: Jill Sandoval is a 30 year old Caucasian female, married, currently lives in [place], has a history of bipolar disorder type II was evaluated by telemedicine today.  Patient today reports she has noticed some improvement of her anxiety symptoms on the BuSpar.  She reports she wants to give the BuSpar more time since she just increased the dosage few days ago.  Denies side effects.  Patient reports she has not had any significant depressive symptoms or mood lability recently.  She reports sleep is going well.  Patient does report that her testosterone levels came back high recently and she has upcoming appointment with OB/GYN for further management.  She reports she possibly has PCOS.  Patient denies any suicidality, homicidality or perceptual disturbances.  Patient denies any other concerns today.  Visit Diagnosis:    ICD-10-CM   1. Bipolar 2 disorder, major depressive  episode (HCC)  F31.81   2. Panic attacks  F41.0     Past Psychiatric History: I have reviewed past psychiatric history from my progress note on 10/15/2018.  Past trials of Celexa, Trileptal.  Past Medical History:  Past Medical History:  Diagnosis Date  . Anxiety and depression   . Asthma    inhaler prn  . Fetal demise > 22 weeks, delivered, current hospitalization 11/04/2015  . Miscarriage     Past Surgical History:  Procedure Laterality Date  . CESAREAN SECTION N/A 03/24/2012   Procedure: CESAREAN SECTION;  Surgeon: Mickel Baas, MD;  Location: [facility];  Service: Obstetrics;  Laterality: N/A;  . CESAREAN SECTION N/A 09/24/2016   Procedure: CESAREAN SECTION;  Surgeon: Waynard Reeds, MD;  Location: [facility];  Service: Obstetrics;  Laterality: N/A;    Family Psychiatric History: I have reviewed family psychiatric history from my progress note on 10/15/2018  Family History:  Family History  Problem Relation Age of Onset  . Hypertension Maternal Grandmother   . Prostate cancer Maternal Grandfather   . Hearing loss Brother   . Hearing loss Paternal Grandfather   . Hypertension Father   . Other Neg Hx     Social History: Reviewed social history from my progress note on 10/15/2018 Social History   Socioeconomic History  . Marital status: Married    Spouse name: Not on file  . Number of children: 2  . Years of education: Not on file  . Highest education level: Not on file  Occupational History  . Not on file  Tobacco Use  .  Smoking status: Former Smoker    Packs/day: 0.25    Types: Cigarettes    Quit date: 11/14/2018    Years since quitting: 1.4  . Smokeless tobacco: Never Used  Vaping Use  . Vaping Use: Some days  Substance and Sexual Activity  . Alcohol use: No  . Drug use: No  . Sexual activity: Yes  Other Topics Concern  . Not on file  Social History Narrative   Married.   2 children.   Works at [employer].   Enjoys camping, being outdoors.     Social Determinants of Health   Financial Resource Strain: Not on file  Food Insecurity: Not on file  Transportation Needs: Not on file  Physical Activity: Not on file  Stress: Not on file  Social Connections: Not on file    Allergies: No Known Allergies  Metabolic Disorder Labs: Lab Results  Component Value Date   HGBA1C 5.1 08/27/2019   No results found for: PROLACTIN Lab Results  Component Value Date   CHOL 215 (H) 08/27/2019   TRIG 271.0 (H) 08/27/2019   HDL 45.70 08/27/2019   CHOLHDL 5 08/27/2019   VLDL 54.2 (H) 08/27/2019   LDLCALC 145 (H) 07/16/2018   LDLCALC 103 (H) 06/30/2015   Lab Results  Component Value Date   TSH 1.34 04/27/2020   TSH 1.42 07/08/2019    Therapeutic Level Labs: No results found for: LITHIUM No results found for: VALPROATE No components found for:  CBMZ  Current Medications: Current Outpatient Medications  Medication Sig Dispense Refill  . busPIRone (BUSPAR) 10 MG tablet Take 1 tablet (10 mg total) by mouth 2 (two) times daily. (Patient not taking: Reported on 04/27/2020) 60 tablet 1  . hydrOXYzine (VISTARIL) 25 MG capsule Take 1-2 capsules (25-50 mg total) by mouth as directed. Take for severe panic attacks (Patient not taking: Reported on 04/27/2020) 60 capsule 1  . LARIN 24 FE 1-20 MG-MCG(24) tablet Take 1 tablet by mouth daily.    Marland Kitchen neomycin-polymyxin-dexamethasone (MAXITROL) 0.1 % ophthalmic suspension neomycin-polymyxin-dexameth 3.5 mg/mL-10,000 unit/mL-0.1% eye drops  PLACE 2 DROPS INTO THE LEFT EYE EVERY 6 (SIX) HOURS.    Marland Kitchen nortriptyline (PAMELOR) 25 MG capsule Take 25 mg by mouth at bedtime.    . Oxcarbazepine (TRILEPTAL) 300 MG tablet Take 3 tablets daily by mouth AM and 3 tablets PM 540 tablet 1  . risperiDONE (RISPERDAL) 0.5 MG tablet Take 1.5 tablets (0.75 mg total) by mouth at bedtime. 45 tablet 1  . SUMAtriptan (IMITREX) 25 MG tablet sumatriptan 25 mg tablet     No current facility-administered medications for this  visit.     Musculoskeletal: Strength & Muscle Tone: UTA Gait & Station: UTA Patient leans: N/A  Psychiatric Specialty Exam: Review of Systems  Psychiatric/Behavioral: The patient is nervous/anxious.   All other systems reviewed and are negative.   There were no vitals taken for this visit.There is no height or weight on file to calculate BMI.  General Appearance: Casual  Eye Contact:  Fair  Speech:  Clear and Coherent  Volume:  Normal  Mood:  Anxious  Affect:  Appropriate  Thought Process:  Goal Directed and Descriptions of Associations: Intact  Orientation:  Full (Time, Place, and Person)  Thought Content: Logical   Suicidal Thoughts:  No  Homicidal Thoughts:  No  Memory:  Immediate;   Fair Recent;   Fair Remote;   Fair  Judgement:  Fair  Insight:  Fair  Psychomotor Activity:  Normal  Concentration:  Concentration: Fair  and Attention Span: Fair  Recall:  [employer] of Knowledge: Fair  Language: Fair  Akathisia:  No  Handed:  Right  AIMS (if indicated): UTA  Assets:  Communication Skills Desire for Improvement Housing Social Support  ADL's:  Intact  Cognition: WNL  Sleep:  Fair   Screenings: GAD-7   Flowsheet Row Video Visit from 03/29/2020 in [facility] Counselor from 08/12/2019 in [facility] Office Visit from 07/08/2018 in [facility]  Total GAD-7 Score 18 18 8     PHQ2-9   Flowsheet Row Counselor from 08/12/2019 in [facility] Office Visit from 07/08/2018 in [facility] Total Score 2 3  PHQ-9 Total Score 13 15       Assessment and Plan: Jill Sandoval is a 30 year old Caucasian female, married, lives in [place], has a history of bipolar disorder type II, panic attack, tobacco use disorder was evaluated by telemedicine today.  Patient is currently struggling with anxiety symptoms.  Plan as noted below.  Plan Bipolar  disorder type II-improving Risperidone 0.75 mg p.o. daily Trileptal 900 mg p.o. twice daily  Panic attacks-improving Pamelor as prescribed by neurology BuSpar 10 mg p.o. twice daily Hydroxyzine 25 to 50 mg p.o. daily as needed for severe panic attacks only. Patient was advised to restart CBT last visit.  Patient has upcoming appointment with OB/GYN for elevated testosterone level, possible PCOS.  Reviewed testosterone levels-04/27/2020-elevated at 56.61  Pending labs-CBC with differential, Trileptal level.  We will encourage patient to get it done.  Patient has been noncompliant with  Follow-up in clinic in 3 to 4 weeks or sooner if needed.  This note was generated in part or whole with voice recognition software. Voice recognition is usually quite accurate but there are transcription errors that can and very often do occur. I apologize for any typographical errors that were not detected and corrected.        04/29/2020, MD 05/03/2020, 1:13 PM

## 2020-05-04 ENCOUNTER — Ambulatory Visit (INDEPENDENT_AMBULATORY_CARE_PROVIDER_SITE_OTHER): Payer: BC Managed Care – PPO | Admitting: Licensed Clinical Social Worker

## 2020-05-04 DIAGNOSIS — F410 Panic disorder [episodic paroxysmal anxiety] without agoraphobia: Secondary | ICD-10-CM | POA: Diagnosis not present

## 2020-05-04 DIAGNOSIS — F3181 Bipolar II disorder: Secondary | ICD-10-CM

## 2020-05-04 NOTE — Progress Notes (Signed)
Virtual Visit via Video Note  I connected with Jill Sandoval on 05/04/20 at  4:00 PM EDT by a video enabled telemedicine application and verified that I am speaking with the correct person using two identifiers.  Location: Patient: home Provider: [facility]   I discussed the limitations of evaluation and management by telemedicine and the availability of in person appointments. The patient expressed understanding and agreed to proceed.   I discussed the assessment and treatment plan with the patient. The patient was provided an opportunity to ask questions and all were answered. The patient agreed with the plan and demonstrated an understanding of the instructions.   The patient was advised to call back or seek an in-person evaluation if the symptoms worsen or if the condition fails to improve as anticipated.  I provided 30 minutes of non-face-to-face time during this encounter.   Jill Sandoval R Jill Delcarlo, LCSW    THERAPIST PROGRESS NOTE  Session Time: 4-4:30  Participation Level: Active  Behavioral Response: Neat and Well GroomedAlertAnxious  Type of Therapy: Individual Therapy  Treatment Goals addressed: Anxiety  Interventions: CBT  Summary: Jill Sandoval is a 30 y.o. female who presents with improving mood-related symptoms.  Pt reports fewer panic episodes than last session. Pt reports that sleep quality and quantity is continuing to fluctuate--is trying multiple interventions to try to improve.   Pt reports recently working with OBGYN to figure out other reasons/triggers for mood related changes and increased anxiety. Pt was dx with PCOS by OBGYN and is currently being treated for that. Pt reports that she has noticed a significant difference in overall mood and anxiety since being treated.   Reviewed current coping skills that pt is using and encouraged pt to continue with skills that have been helpful in the past.   Continued recommendations are as follows: self care  behaviors, positive social engagements, focusing on overall work/home/life balance, and focusing on positive physical and emotional wellness. Emailed pt worksheets about managing panic attacks and meditation/mindfulness.  Suicidal/Homicidal: No  Therapist Response: Jill Sandoval is able to utilize newly learned skills in relaxation, cognitive restructuring and problem solving exposures as needed to address emergent worries/anxiety. Jill Sandoval is continuing to utilize these skills regularly. Jill Sandoval feels that mood has elevated and is developing evidence of increased energy, activities, and socialization. These behaviors are reflective of continued personal growth and progress. Treatment to continue.   Plan: Return again in 3 weeks.  Diagnosis: Axis I: Bipolar, Depressed and Panic Disorder    Axis II: No diagnosis    Jill Sandoval Jill Arceneaux, LCSW 05/04/2020

## 2020-05-19 ENCOUNTER — Telehealth: Payer: BC Managed Care – PPO | Admitting: Psychiatry

## 2020-05-26 ENCOUNTER — Encounter: Payer: Self-pay | Admitting: Psychiatry

## 2020-05-26 ENCOUNTER — Other Ambulatory Visit: Payer: Self-pay

## 2020-05-26 ENCOUNTER — Telehealth (INDEPENDENT_AMBULATORY_CARE_PROVIDER_SITE_OTHER): Payer: BC Managed Care – PPO | Admitting: Psychiatry

## 2020-05-26 ENCOUNTER — Telehealth: Payer: Self-pay

## 2020-05-26 DIAGNOSIS — F410 Panic disorder [episodic paroxysmal anxiety] without agoraphobia: Secondary | ICD-10-CM

## 2020-05-26 DIAGNOSIS — Z79899 Other long term (current) drug therapy: Secondary | ICD-10-CM

## 2020-05-26 DIAGNOSIS — F3181 Bipolar II disorder: Secondary | ICD-10-CM | POA: Diagnosis not present

## 2020-05-26 DIAGNOSIS — G4700 Insomnia, unspecified: Secondary | ICD-10-CM | POA: Insufficient documentation

## 2020-05-26 MED ORDER — NORTRIPTYLINE HCL 50 MG PO CAPS: 50.0000 mg | ORAL_CAPSULE | Freq: Every day | ORAL | 1 refills | Status: DC

## 2020-05-26 NOTE — Progress Notes (Signed)
Virtual Visit via Video Note  I connected with Jill Sandoval on 05/26/20 at  4:00 PM EDT by a video enabled telemedicine application and verified that I am speaking with the correct person using two identifiers.  Location Provider Location : [facility] Patient Location : Home  Participants: Patient , Provider    I discussed the limitations of evaluation and management by telemedicine and the availability of in person appointments. The patient expressed understanding and agreed to proceed.   I discussed the assessment and treatment plan with the patient. The patient was provided an opportunity to ask questions and all were answered. The patient agreed with the plan and demonstrated an understanding of the instructions.   The patient was advised to call back or seek an in-person evaluation if the symptoms worsen or if the condition fails to improve as anticipated.   [facility] MD OP Progress Note  05/26/2020 4:45 PM Jill Sandoval  MRN:  [id number]  Chief Complaint:  Chief Complaint    Follow-up; Anxiety; Depression     HPI: Jill Sandoval is a 30 year old Caucasian female, married, currently lives in [place], has a history of bipolar disorder type II was evaluated by telemedicine today.  Patient today reports she is currently struggling with sleep problems.  She reports she wakes up several times during the night.  She is following a good sleep hygiene.  She switches of TV by around 7 to 7:30 PM and tries to wind down.  She does not use a lot of caffeine.  She reports the Pamelor which she takes for headaches helps to some extent.  She reports mood wise she is doing okay.  Denies sadness, crying spells.  Denies panic attacks.  She reports she is compliant on medications.  Denies side effects.  Patient reports she has not been able to get her labs like Trileptal level as well as the other labs ordered.  She however agrees to get it done.  Patient denies any other concerns today.  Visit  Diagnosis:    ICD-10-CM   1. Bipolar 2 disorder, major depressive episode (HCC)  F31.81    In remission  2. Panic attacks  F41.0   3. Insomnia, unspecified type  G47.00 nortriptyline (PAMELOR) 50 MG capsule  4. High risk medication use  Z79.899 CBC With Diff/Platelet    CMP and Liver    10-Hydroxycarbazepine    Past Psychiatric History: I have reviewed past psychiatric history from progress note on 10/15/2018.  Past trials of Celexa, Trileptal  Past Medical History:  Past Medical History:  Diagnosis Date  . Anxiety and depression   . Asthma    inhaler prn  . Fetal demise > 22 weeks, delivered, current hospitalization 11/04/2015  . Miscarriage     Past Surgical History:  Procedure Laterality Date  . CESAREAN SECTION N/A 03/24/2012   Procedure: CESAREAN SECTION;  Surgeon: Mickel Baas, MD;  Location: [facility];  Service: Obstetrics;  Laterality: N/A;  . CESAREAN SECTION N/A 09/24/2016   Procedure: CESAREAN SECTION;  Surgeon: Waynard Reeds, MD;  Location: [facility];  Service: Obstetrics;  Laterality: N/A;    Family Psychiatric History: I have reviewed family psychiatric history from progress note on 10/15/2018  Family History:  Family History  Problem Relation Age of Onset  . Hypertension Maternal Grandmother   . Prostate cancer Maternal Grandfather   . Hearing loss Brother   . Hearing loss Paternal Grandfather   . Hypertension Father   . Other Neg Hx  Social History: Reviewed social history from progress note on 10/15/2018 Social History   Socioeconomic History  . Marital status: Married    Spouse name: Not on file  . Number of children: 2  . Years of education: Not on file  . Highest education level: Not on file  Occupational History  . Not on file  Tobacco Use  . Smoking status: Former Smoker    Packs/day: 0.25    Types: Cigarettes    Quit date: 11/14/2018    Years since quitting: 1.5  . Smokeless tobacco: Never Used  Vaping Use  . Vaping Use: Some  days  Substance and Sexual Activity  . Alcohol use: No  . Drug use: No  . Sexual activity: Yes  Other Topics Concern  . Not on file  Social History Narrative   Married.   2 children.   Works at [employer].   Enjoys camping, being outdoors.    Social Determinants of Health   Financial Resource Strain: Not on file  Food Insecurity: Not on file  Transportation Needs: Not on file  Physical Activity: Not on file  Stress: Not on file  Social Connections: Not on file    Allergies: No Known Allergies  Metabolic Disorder Labs: Lab Results  Component Value Date   HGBA1C 5.1 08/27/2019   No results found for: PROLACTIN Lab Results  Component Value Date   CHOL 215 (H) 08/27/2019   TRIG 271.0 (H) 08/27/2019   HDL 45.70 08/27/2019   CHOLHDL 5 08/27/2019   VLDL 54.2 (H) 08/27/2019   LDLCALC 145 (H) 07/16/2018   LDLCALC 103 (H) 06/30/2015   Lab Results  Component Value Date   TSH 1.34 04/27/2020   TSH 1.42 07/08/2019    Therapeutic Level Labs: No results found for: LITHIUM No results found for: VALPROATE No components found for:  CBMZ  Current Medications: Current Outpatient Medications  Medication Sig Dispense Refill  . busPIRone (BUSPAR) 10 MG tablet Take 1 tablet (10 mg total) by mouth 2 (two) times daily. 60 tablet 1  . hydrOXYzine (VISTARIL) 25 MG capsule Take 1-2 capsules (25-50 mg total) by mouth as directed. Take for severe panic attacks 60 capsule 1  . nortriptyline (PAMELOR) 50 MG capsule Take 1 capsule (50 mg total) by mouth at bedtime. 30 capsule 1  . Oxcarbazepine (TRILEPTAL) 300 MG tablet Take 3 tablets daily by mouth AM and 3 tablets PM 540 tablet 1  . risperiDONE (RISPERDAL) 0.5 MG tablet Take 1.5 tablets (0.75 mg total) by mouth at bedtime. 45 tablet 1  . LARIN 24 FE 1-20 MG-MCG(24) tablet Take 1 tablet by mouth daily.    Marland Kitchen neomycin-polymyxin-dexamethasone (MAXITROL) 0.1 % ophthalmic suspension neomycin-polymyxin-dexameth 3.5 mg/mL-10,000 unit/mL-0.1% eye  drops  PLACE 2 DROPS INTO THE LEFT EYE EVERY 6 (SIX) HOURS.    . SUMAtriptan (IMITREX) 25 MG tablet sumatriptan 25 mg tablet     No current facility-administered medications for this visit.     Musculoskeletal: Strength & Muscle Tone: UTA Gait & Station: UTA Patient leans: N/A  Psychiatric Specialty Exam: Review of Systems  Psychiatric/Behavioral: Positive for sleep disturbance.  All other systems reviewed and are negative.   There were no vitals taken for this visit.There is no height or weight on file to calculate BMI.  General Appearance: Casual  Eye Contact:  Fair  Speech:  Clear and Coherent  Volume:  Normal  Mood:  Euthymic  Affect:  Congruent  Thought Process:  Goal Directed and Descriptions of Associations: Intact  Orientation:  Full (Time, Place, and Person)  Thought Content: Logical   Suicidal Thoughts:  No  Homicidal Thoughts:  No  Memory:  Immediate;   Fair Recent;   Fair Remote;   Fair  Judgement:  Fair  Insight:  Fair  Psychomotor Activity:  Normal  Concentration:  Concentration: Fair and Attention Span: Fair  Recall:  [employer] of Knowledge: Fair  Language: Fair  Akathisia:  No  Handed:  Right  AIMS (if indicated): UTA  Assets:  Communication Skills Desire for Improvement Housing Social Support  ADL's:  Intact  Cognition: WNL  Sleep:  Poor   Screenings: GAD-7   Flowsheet Row Video Visit from 03/29/2020 in [facility] Counselor from 08/12/2019 in [facility] Office Visit from 07/08/2018 in [facility]  Total GAD-7 Score 18 18 8     PHQ2-9   Flowsheet Row Counselor from 05/04/2020 in [facility] Counselor from 08/12/2019 in [facility] Office Visit from 07/08/2018 in [facility]  PHQ-2 Total Score 0 2 3  PHQ-9 Total Score -- 13 15    Flowsheet Row Counselor from 05/04/2020 in [facility]  C-SSRS RISK CATEGORY No Risk       Assessment and Plan: KARRI KALLENBACH is a 30 year old Caucasian female, married, lives in [place], has a history of bipolar disorder type II, panic attack, tobacco use disorder was evaluated by telemedicine today.  Patient is currently struggling with sleep.  She will benefit from the following plan.  Plan Bipolar disorder type II in remission Risperidone 0.75 mg p.o. daily Trileptal 900 mg p.o. twice daily  Panic attacks-improving BuSpar 10 mg p.o. twice daily Hydroxyzine 25 to 50 mg p.o. daily as needed for severe panic attacks Patient to continue CBT as needed  Insomnia unspecified-unstable Increase Pamelor to 50 mg p.o. nightly.  Initially prescribed by neurology. Discussed drug to drug interaction with her other psychotropic medications including serotonin syndrome.  High risk medication- we will order CBC with differential, Trileptal level, CMP with liver.  Discussed with patient since she has been noncompliant with labs will only give further prescription for Trileptal once labs completed.  Will fax it to [facility]   Follow-up in clinic in 6 weeks or sooner if needed.  This note was generated in part or whole with voice recognition software. Voice recognition is usually quite accurate but there are transcription errors that can and very often do occur. I apologize for any typographical errors that were not detected and corrected.       [facility], MD 05/27/2020, 8:58 AM

## 2020-05-26 NOTE — Patient Instructions (Signed)
Serotonin Syndrome Serotonin is a chemical in your body (neurotransmitter) that helps to control several functions, such as:  Brain and nerve cell function.  Mood and emotions.  Memory.  Eating.  Sleeping.  Sexual activity.  Stress response. Having too much serotonin in your body can cause serotonin syndrome. This condition can be harmful to your brain and nerve cells. This can be a life-threatening condition. What are the causes? This condition may be caused by taking medicines or drugs that increase the level of serotonin in your body, such as:  Antidepressant medicines.  Migraine medicines.  Certain pain medicines.  Certain drugs, including ecstasy, LSD, cocaine, and amphetamines.  Over-the-counter cough or cold medicines that contain dextromethorphan.  Certain herbal supplements, including St. John's wort, ginseng, and nutmeg. This condition usually occurs when you take these medicines or drugs in combination, but it can also happen with a high dose of a single medicine or drug. What increases the risk? You are more likely to develop this condition if:  You just started taking a medicine or drug that increases the level of serotonin in the body.  You recently increased the dose of a medicine or drug that increases the level of serotonin in the body.  You take more than one medicine or drug that increases the level of serotonin in the body. What are the signs or symptoms? Symptoms of this condition usually start within several hours of taking a medicine or drug. Symptoms may be mild or severe. Mild symptoms include:  Sweating.  Restlessness or agitation.  Muscle twitching or stiffness.  Rapid heart rate.  Nausea and vomiting.  Diarrhea.  Headache.  Shivering or goose bumps.  Confusion. Severe symptoms include:  Irregular heartbeat.  Seizures.  Loss of consciousness.  High fever. How is this diagnosed? This condition may be diagnosed based  on:  Your medical history.  A physical exam.  Your prior use of drugs and medicines.  Blood or urine tests. These may be used to rule out other causes of your symptoms. How is this treated? The treatment for this condition depends on the severity of your symptoms.  For mild cases, stopping the medicine or drug that caused your condition is usually all that is needed.  For moderate to severe cases, treatment in a hospital may be needed to prevent or manage life-threatening symptoms. This may include medicines to control your symptoms, IV fluids, interventions to support your breathing, and treatments to control your body temperature. Follow these instructions at home: Medicines  Take over-the-counter and prescription medicines only as told by your health care provider. This is important.  Check with your health care provider before you start taking any new prescriptions, over-the-counter medicines, herbs, or supplements.  Avoid combining any medicines that can cause this condition to occur.   Lifestyle  Maintain a healthy lifestyle. ? Eat a healthy diet that includes plenty of vegetables, fruits, whole grains, low-fat dairy products, and lean protein. Do not eat a lot of foods that are high in fat, added sugars, or salt. ? Get the right amount and quality of sleep. Most adults need 7-9 hours of sleep each night. ? Make time to exercise, even if it is only for [place] periods of time. Most adults should exercise for at least 150 minutes each week. ? Do not drink alcohol. ? Do not use illegal drugs, and do not take medicines for reasons other than they are prescribed.   General instructions  Do not use any products that   contain nicotine or tobacco, such as cigarettes and e-cigarettes. If you need help quitting, ask your health care provider.  Keep all follow-up visits as told by your health care provider. This is important. Contact a health care provider if:  Your symptoms do not  improve or they get worse. Get help right away if you:  Have worsening confusion, severe headache, chest pain, high fever, seizures, or loss of consciousness.  Experience serious side effects of medicine, such as swelling of your face, lips, tongue, or throat.  Have serious thoughts about hurting yourself or others. These symptoms may represent a serious problem that is an emergency. Do not wait to see if the symptoms will go away. Get medical help right away. Call your local emergency services (911 in the [place] Do not drive yourself to the hospital. If you ever feel like you may hurt yourself or others, or have thoughts about taking your own life, get help right away. You can go to your nearest emergency department or call:  Your local emergency services (911 in the [place]  A suicide crisis helpline, such as the National Suicide Prevention Lifeline at 1-800-273-8255. This is open 24 hours a day. Summary  Serotonin is a brain chemical that helps to regulate the nervous system. High levels of serotonin in the body can cause serotonin syndrome, which is a very dangerous condition.  This condition may be caused by taking medicines or drugs that increase the level of serotonin in your body.  Treatment depends on the severity of your symptoms. For mild cases, stopping the medicine or drug that caused your condition is usually all that is needed.  Check with your health care provider before you start taking any new prescriptions, over-the-counter medicines, herbs, or supplements. This information is not intended to replace advice given to you by your health care provider. Make sure you discuss any questions you have with your health care provider. Document Revised: 02/22/2017 Document Reviewed: 02/22/2017 Elsevier Patient Education  2021 [employer]  

## 2020-05-26 NOTE — Telephone Encounter (Signed)
faxed and confirmed labwork orders 10- hydroxycarbazepine,  cbc with diff,  cmp and liver, pt needs to be fasting. dx:  z79.899

## 2020-06-07 ENCOUNTER — Ambulatory Visit: Payer: BC Managed Care – PPO | Admitting: Licensed Clinical Social Worker

## 2020-06-08 ENCOUNTER — Encounter: Payer: Self-pay | Admitting: Internal Medicine

## 2020-06-09 ENCOUNTER — Other Ambulatory Visit
Admission: RE | Admit: 2020-06-09 | Discharge: 2020-06-09 | Disposition: A | Discharge status: Home / Self Care | Payer: BC Managed Care – PPO | Source: Ambulatory Visit | Attending: Psychiatry | Admitting: Psychiatry

## 2020-06-09 DIAGNOSIS — F3181 Bipolar II disorder: Secondary | ICD-10-CM | POA: Diagnosis not present

## 2020-06-09 DIAGNOSIS — Z5181 Encounter for therapeutic drug level monitoring: Secondary | ICD-10-CM | POA: Diagnosis not present

## 2020-06-09 DIAGNOSIS — F410 Panic disorder [episodic paroxysmal anxiety] without agoraphobia: Secondary | ICD-10-CM | POA: Diagnosis not present

## 2020-06-09 DIAGNOSIS — Z79899 Other long term (current) drug therapy: Secondary | ICD-10-CM | POA: Diagnosis not present

## 2020-06-09 DIAGNOSIS — G4700 Insomnia, unspecified: Secondary | ICD-10-CM | POA: Insufficient documentation

## 2020-06-09 LAB — CBC WITH DIFFERENTIAL/PLATELET
Abs Immature Granulocytes: 0.03 10*3/uL (ref 0.00–0.07)
Basophils Absolute: 0 10*3/uL (ref 0.0–0.1)
Basophils Relative: 0 %
Eosinophils Absolute: 0.3 10*3/uL (ref 0.0–0.5)
Eosinophils Relative: 4 %
HCT: 39.4 % (ref 36.0–46.0)
Hemoglobin: 13 g/dL (ref 12.0–15.0)
Immature Granulocytes: 0 %
Lymphocytes Relative: 25 %
Lymphs Abs: 2.2 10*3/uL (ref 0.7–4.0)
MCH: 28.3 pg (ref 26.0–34.0)
MCHC: 33 g/dL (ref 30.0–36.0)
MCV: 85.7 fL (ref 80.0–100.0)
Monocytes Absolute: 0.4 10*3/uL (ref 0.1–1.0)
Monocytes Relative: 4 %
Neutro Abs: 5.8 10*3/uL (ref 1.7–7.7)
Neutrophils Relative %: 67 %
Platelets: 307 10*3/uL (ref 150–400)
RBC: 4.6 MIL/uL (ref 3.87–5.11)
RDW: 12.8 % (ref 11.5–15.5)
WBC: 8.7 10*3/uL (ref 4.0–10.5)
nRBC: 0 % (ref 0.0–0.2)

## 2020-06-09 LAB — COMPREHENSIVE METABOLIC PANEL
ALT: 20 U/L (ref 0–44)
AST: 14 U/L — ABNORMAL LOW (ref 15–41)
Albumin: 4.3 g/dL (ref 3.5–5.0)
Alkaline Phosphatase: 82 U/L (ref 38–126)
Anion gap: 10 (ref 5–15)
BUN: 18 mg/dL (ref 6–20)
CO2: 24 mmol/L (ref 22–32)
Calcium: 9.3 mg/dL (ref 8.9–10.3)
Chloride: 103 mmol/L (ref 98–111)
Creatinine, Ser: 0.86 mg/dL (ref 0.44–1.00)
GFR, Estimated: >60 mL/min (ref >60)
Glucose, Bld: 105 mg/dL — ABNORMAL HIGH (ref 70–99)
Potassium: 4.6 mmol/L (ref 3.5–5.1)
Sodium: 137 mmol/L (ref 135–145)
Total Bilirubin: 0.4 mg/dL (ref 0.3–1.2)
Total Protein: 7.6 g/dL (ref 6.5–8.1)

## 2020-06-14 ENCOUNTER — Other Ambulatory Visit
Admission: RE | Admit: 2020-06-14 | Discharge: 2020-06-14 | Disposition: A | Discharge status: Home / Self Care | Payer: BC Managed Care – PPO | Source: Ambulatory Visit | Attending: Psychiatry | Admitting: Psychiatry

## 2020-06-14 DIAGNOSIS — E782 Mixed hyperlipidemia: Secondary | ICD-10-CM | POA: Diagnosis not present

## 2020-06-14 LAB — LIPID PANEL
Cholesterol: 231 mg/dL — ABNORMAL HIGH (ref 0–200)
HDL: 60 mg/dL (ref >40)
LDL Cholesterol: 151 mg/dL — ABNORMAL HIGH (ref 0–99)
Total CHOL/HDL Ratio: 3.9 RATIO
Triglycerides: 99 mg/dL (ref <150)
VLDL: 20 mg/dL (ref 0–40)

## 2020-06-19 ENCOUNTER — Encounter: Payer: Self-pay | Admitting: Internal Medicine

## 2020-06-20 MED ORDER — ATORVASTATIN CALCIUM 10 MG PO TABS: 10.0000 mg | ORAL_TABLET | Freq: Every day | ORAL | 0 refills | Status: DC

## 2020-06-21 ENCOUNTER — Other Ambulatory Visit: Payer: Self-pay | Admitting: Psychiatry

## 2020-06-21 DIAGNOSIS — F3181 Bipolar II disorder: Secondary | ICD-10-CM

## 2020-06-21 DIAGNOSIS — F410 Panic disorder [episodic paroxysmal anxiety] without agoraphobia: Secondary | ICD-10-CM

## 2020-07-14 ENCOUNTER — Telehealth (INDEPENDENT_AMBULATORY_CARE_PROVIDER_SITE_OTHER): Payer: BC Managed Care – PPO | Admitting: Psychiatry

## 2020-07-14 ENCOUNTER — Encounter: Payer: Self-pay | Admitting: Psychiatry

## 2020-07-14 ENCOUNTER — Other Ambulatory Visit: Payer: Self-pay

## 2020-07-14 DIAGNOSIS — F410 Panic disorder [episodic paroxysmal anxiety] without agoraphobia: Secondary | ICD-10-CM

## 2020-07-14 DIAGNOSIS — G4701 Insomnia due to medical condition: Secondary | ICD-10-CM | POA: Diagnosis not present

## 2020-07-14 DIAGNOSIS — F3176 Bipolar disorder, in full remission, most recent episode depressed: Secondary | ICD-10-CM

## 2020-07-14 DIAGNOSIS — Z79899 Other long term (current) drug therapy: Secondary | ICD-10-CM

## 2020-07-14 MED ORDER — RISPERIDONE 0.5 MG PO TABS: ORAL_TABLET | ORAL | 2 refills | Status: DC

## 2020-07-14 MED ORDER — BUSPIRONE HCL 10 MG PO TABS: 10.0000 mg | ORAL_TABLET | Freq: Two times a day (BID) | ORAL | 2 refills | Status: DC

## 2020-07-14 MED ORDER — NORTRIPTYLINE HCL 50 MG PO CAPS: 50.0000 mg | ORAL_CAPSULE | Freq: Every day | ORAL | 2 refills | Status: DC

## 2020-07-14 NOTE — Progress Notes (Signed)
Virtual Visit via Video Note  I connected with Jill Sandoval on 07/14/20 at  4:00 PM EDT by a video enabled telemedicine application and verified that I am speaking with the correct person using two identifiers.  Location Provider Location : [facility] Patient Location : Home  Participants: Patient , Provider   I discussed the limitations of evaluation and management by telemedicine and the availability of in person appointments. The patient expressed understanding and agreed to proceed.   I discussed the assessment and treatment plan with the patient. The patient was provided an opportunity to ask questions and all were answered. The patient agreed with the plan and demonstrated an understanding of the instructions.   The patient was advised to call back or seek an in-person evaluation if the symptoms worsen or if the condition fails to improve as anticipated.    [facility] MD OP Progress Note  07/14/2020 4:30 PM Jill Sandoval  MRN:  [id number]  Chief Complaint:  Chief Complaint   Follow-up; Anxiety; Depression    HPI: Jill Sandoval is a 30 year old Caucasian female, married, currently lives in [place], has a history of bipolar disorder type II was evaluated by telemedicine today.  Patient today reports she is currently making progress with regards to her mood.  Her panic attacks have improved.  She does not have as frequent panic attacks as she used to before.  She reports sleep is improving.  She does wake up in between however she is able to go back to sleep okay.  She is compliant on medications as prescribed.  Denies any suicidality, homicidality.  Denies any perceptual disturbances.  She reports she has been trying to watch her diet since she wants to lose weight.  Patient reports she was able to get all her labs done however the Trileptal level could not be completed due to lab missing it.  Patient agrees to get it done.  Patient denies any other concerns today.  Visit  Diagnosis:    ICD-10-CM   1. Bipolar disorder, in full remission, most recent episode depressed (HCC)  F31.76 risperiDONE (RISPERDAL) 0.5 MG tablet    2. Panic disorder  F41.0 busPIRone (BUSPAR) 10 MG tablet    3. Insomnia due to medical condition  G47.01 nortriptyline (PAMELOR) 50 MG capsule   mood    4. High risk medication use  Z79.899 10-Hydroxycarbazepine      Past Psychiatric History: I have reviewed past psychiatric history from progress note on 10/15/2018.  Past trials of Celexa, Trileptal  Past Medical History:  Past Medical History:  Diagnosis Date   Anxiety and depression    Asthma    inhaler prn   Fetal demise > 22 weeks, delivered, current hospitalization 11/04/2015   Miscarriage     Past Surgical History:  Procedure Laterality Date   CESAREAN SECTION N/A 03/24/2012   Procedure: CESAREAN SECTION;  Surgeon: Mickel Baas, MD;  Location: [facility];  Service: Obstetrics;  Laterality: N/A;   CESAREAN SECTION N/A 09/24/2016   Procedure: CESAREAN SECTION;  Surgeon: Waynard Reeds, MD;  Location: [facility];  Service: Obstetrics;  Laterality: N/A;    Family Psychiatric History: I have reviewed family psychiatric history from progress note on 10/15/2018  Family History:  Family History  Problem Relation Age of Onset   Hypertension Maternal Grandmother    Prostate cancer Maternal Grandfather    Hearing loss Brother    Hearing loss Paternal Grandfather    Hypertension Father    Other Neg  Hx     Social History: Reviewed social history from progress note on 10/15/2018 Social History   Socioeconomic History   Marital status: Married    Spouse name: Not on file   Number of children: 2   Years of education: Not on file   Highest education level: Not on file  Occupational History   Not on file  Tobacco Use   Smoking status: Former    Packs/day: 0.25    Pack years: 0.00    Types: Cigarettes    Quit date: 11/14/2018    Years since quitting: 1.6   Smokeless  tobacco: Never  Vaping Use   Vaping Use: Some days  Substance and Sexual Activity   Alcohol use: No   Drug use: No   Sexual activity: Yes  Other Topics Concern   Not on file  Social History Narrative   Married.   2 children.   Works at [employer].   Enjoys camping, being outdoors.    Social Determinants of Health   Financial Resource Strain: Not on file  Food Insecurity: Not on file  Transportation Needs: Not on file  Physical Activity: Not on file  Stress: Not on file  Social Connections: Not on file    Allergies: No Known Allergies  Metabolic Disorder Labs: Lab Results  Component Value Date   HGBA1C 5.1 08/27/2019   No results found for: PROLACTIN Lab Results  Component Value Date   CHOL 231 (H) 06/14/2020   TRIG 99 06/14/2020   HDL 60 06/14/2020   CHOLHDL 3.9 06/14/2020   VLDL 20 06/14/2020   LDLCALC 151 (H) 06/14/2020   LDLCALC 145 (H) 07/16/2018   Lab Results  Component Value Date   TSH 1.34 04/27/2020   TSH 1.42 07/08/2019    Therapeutic Level Labs: No results found for: LITHIUM No results found for: VALPROATE No components found for:  CBMZ  Current Medications: Current Outpatient Medications  Medication Sig Dispense Refill   atorvastatin (LIPITOR) 10 MG tablet Take 1 tablet (10 mg total) by mouth daily. 90 tablet 0   busPIRone (BUSPAR) 10 MG tablet Take 1 tablet (10 mg total) by mouth 2 (two) times daily. 60 tablet 2   hydrOXYzine (VISTARIL) 25 MG capsule Take 1-2 capsules (25-50 mg total) by mouth as directed. Take for severe panic attacks 60 capsule 1   LARIN 24 FE 1-20 MG-MCG(24) tablet Take 1 tablet by mouth daily.     neomycin-polymyxin-dexamethasone (MAXITROL) 0.1 % ophthalmic suspension neomycin-polymyxin-dexameth 3.5 mg/mL-10,000 unit/mL-0.1% eye drops  PLACE 2 DROPS INTO THE LEFT EYE EVERY 6 (SIX) HOURS.     nortriptyline (PAMELOR) 50 MG capsule Take 1 capsule (50 mg total) by mouth at bedtime. 30 capsule 2   Oxcarbazepine (TRILEPTAL) 300  MG tablet TAKE 3 TABLETS BY MOUTH TWICE DAILY (AM  AND  PM) 540 tablet 0   risperiDONE (RISPERDAL) 0.5 MG tablet TAKE 1 & 1/2 (ONE & ONE-HALF) TABLETS BY MOUTH AT BEDTIME 45 tablet 2   SUMAtriptan (IMITREX) 25 MG tablet sumatriptan 25 mg tablet     No current facility-administered medications for this visit.     Musculoskeletal: Strength & Muscle Tone:  UTA Gait & Station:  UTA Patient leans: N/A  Psychiatric Specialty Exam: Review of Systems  Psychiatric/Behavioral:  The patient is nervous/anxious.   All other systems reviewed and are negative.  There were no vitals taken for this visit.There is no height or weight on file to calculate BMI.  General Appearance: Casual  Eye Contact:  Good  Speech:  Clear and Coherent  Volume:  Normal  Mood:  Anxious  Affect:  Appropriate  Thought Process:  Goal Directed and Descriptions of Associations: Intact  Orientation:  Full (Time, Place, and Person)  Thought Content: Logical   Suicidal Thoughts:  No  Homicidal Thoughts:  No  Memory:  Immediate;   Fair Recent;   Fair Remote;   Fair  Judgement:  Fair  Insight:  Fair  Psychomotor Activity:  Normal  Concentration:  Concentration: Good and Attention Span: Fair  Recall:  Good  Fund of Knowledge: Good  Language: Fair  Akathisia:  No  Handed:  Right  AIMS (if indicated): not done  Assets:  Communication Skills Desire for Improvement Financial Resources/Insurance Housing Social Support Talents/Skills Transportation Vocational/Educational  ADL's:  Intact  Cognition: WNL  Sleep:  Fair   Screenings: GAD-7    Flowsheet Row Video Visit from 03/29/2020 in [facility] Counselor from 08/12/2019 in [facility] Office Visit from 07/08/2018 in [facility]  Total GAD-7 Score 18 18 8       PHQ2-9    Flowsheet Row Video Visit from 07/14/2020 in [facility] Counselor from 05/04/2020 in  [facility] Counselor from 08/12/2019 in [facility] Office Visit from 07/08/2018 in [facility] [place]  PHQ-2 Total Score 0 0 2 3  PHQ-9 Total Score -- -- 13 15      Flowsheet Row Video Visit from 07/14/2020 in [facility] Counselor from 05/04/2020 in [facility]  C-SSRS RISK CATEGORY No Risk No Risk        Assessment and Plan: Jill Sandoval is a 30 year old Caucasian female, married, lives in [place], has a history of bipolar disorder type II, panic attacks, tobacco use disorder was evaluated by telemedicine today.  Patient is currently making progress.  Plan as noted below.  Plan Bipolar disorder type II in remission Risperidone 0.75 mg p.o. daily. Trileptal 900 mg p.o. twice daily  Panic attacks-improving BuSpar 10 mg p.o. twice daily Hydroxyzine 25 to 50 mg p.o. daily as needed for severe panic attacks   Insomnia-improving Pamelor 50 mg p.o. nightly   High risk medication use-I have reviewed CBC with differential-WBC elevated at 11, otherwise wnl. CMP-AST low at 14.  Glucose-elevated at 105.  Otherwise within normal limits. Pending Trileptal level.  We will order again.  She agrees to get it done .  Follow-up in clinic in 2 to 3 months or sooner if needed.  This note was generated in part or whole with voice recognition software. Voice recognition is usually quite accurate but there are transcription errors that can and very often do occur. I apologize for any typographical errors that were not detected and corrected.     Rossano Stazione, MD 07/15/2020, 8:18 AM

## 2020-07-18 ENCOUNTER — Other Ambulatory Visit: Payer: Self-pay

## 2020-07-18 ENCOUNTER — Ambulatory Visit (INDEPENDENT_AMBULATORY_CARE_PROVIDER_SITE_OTHER): Payer: Medicaid Other

## 2020-07-18 DIAGNOSIS — Z111 Encounter for screening for respiratory tuberculosis: Secondary | ICD-10-CM

## 2020-07-20 LAB — TB SKIN TEST
Induration: 0 mm
TB Skin Test: NEGATIVE

## 2020-08-12 DIAGNOSIS — Z01419 Encounter for gynecological examination (general) (routine) without abnormal findings: Secondary | ICD-10-CM | POA: Diagnosis not present

## 2020-08-12 DIAGNOSIS — Z124 Encounter for screening for malignant neoplasm of cervix: Secondary | ICD-10-CM | POA: Diagnosis not present

## 2020-08-12 LAB — HM PAP SMEAR: HM Pap smear: NORMAL

## 2020-08-12 LAB — RESULTS CONSOLE HPV: CHL HPV: NEGATIVE

## 2020-08-18 ENCOUNTER — Encounter: Payer: Self-pay | Admitting: Internal Medicine

## 2020-09-09 ENCOUNTER — Encounter: Payer: Medicaid Other | Admitting: Internal Medicine

## 2020-09-12 ENCOUNTER — Encounter: Payer: Medicaid Other | Admitting: Internal Medicine

## 2020-09-15 ENCOUNTER — Encounter: Payer: Self-pay | Admitting: Internal Medicine

## 2020-09-15 ENCOUNTER — Ambulatory Visit (INDEPENDENT_AMBULATORY_CARE_PROVIDER_SITE_OTHER): Payer: BC Managed Care – PPO | Admitting: Internal Medicine

## 2020-09-15 ENCOUNTER — Other Ambulatory Visit: Payer: Self-pay

## 2020-09-15 VITALS — BP 107/42 | HR 86 | Temp 97.7°F | Resp 17 | Ht 64.5 in | Wt 257.2 lb

## 2020-09-15 DIAGNOSIS — F410 Panic disorder [episodic paroxysmal anxiety] without agoraphobia: Secondary | ICD-10-CM

## 2020-09-15 DIAGNOSIS — F3181 Bipolar II disorder: Secondary | ICD-10-CM

## 2020-09-15 DIAGNOSIS — Z0001 Encounter for general adult medical examination with abnormal findings: Secondary | ICD-10-CM | POA: Diagnosis not present

## 2020-09-15 DIAGNOSIS — E282 Polycystic ovarian syndrome: Secondary | ICD-10-CM

## 2020-09-15 DIAGNOSIS — Z1159 Encounter for screening for other viral diseases: Secondary | ICD-10-CM

## 2020-09-15 DIAGNOSIS — J4520 Mild intermittent asthma, uncomplicated: Secondary | ICD-10-CM | POA: Diagnosis not present

## 2020-09-15 DIAGNOSIS — R519 Headache, unspecified: Secondary | ICD-10-CM

## 2020-09-15 DIAGNOSIS — F411 Generalized anxiety disorder: Secondary | ICD-10-CM

## 2020-09-15 DIAGNOSIS — G4701 Insomnia due to medical condition: Secondary | ICD-10-CM

## 2020-09-15 NOTE — Assessment & Plan Note (Signed)
Managed on Buspirone, Hydroxyzine, Oxcarbazepine and Risperidone She will continue to see her therapist and psychiatrist Support offered

## 2020-09-15 NOTE — Assessment & Plan Note (Signed)
Encouraged diet and exercise weight loss 

## 2020-09-15 NOTE — Assessment & Plan Note (Signed)
Managed on Buspirone, Hydroxyzine, Oxcarbazepine and Risperidone She will continue to see her therapist and psychiatrist Support offered 

## 2020-09-15 NOTE — Assessment & Plan Note (Signed)
Currently not an issue Monitor

## 2020-09-15 NOTE — Assessment & Plan Note (Signed)
Not currently trying to get pregnant Continue OCPs She will continue to follow GYN

## 2020-09-15 NOTE — Patient Instructions (Signed)
Health Maintenance, Female Adopting a healthy lifestyle and getting preventive care are important in promoting health and wellness. Ask your health care provider about: The right schedule for you to have regular tests and exams. Things you can do on your own to prevent diseases and keep yourself healthy. What should I know about diet, weight, and exercise? Eat a healthy diet  Eat a diet that includes plenty of vegetables, fruits, low-fat dairy products, and lean protein. Do not eat a lot of foods that are high in solid fats, added sugars, or sodium.  Maintain a healthy weight Body mass index (BMI) is used to identify weight problems. It estimates body fat based on height and weight. Your health care provider can help determineyour BMI and help you achieve or maintain a healthy weight. Get regular exercise Get regular exercise. This is one of the most important things you can do for your health. Most adults should: Exercise for at least 150 minutes each week. The exercise should increase your heart rate and make you sweat (moderate-intensity exercise). Do strengthening exercises at least twice a week. This is in addition to the moderate-intensity exercise. Spend less time sitting. Even light physical activity can be beneficial. Watch cholesterol and blood lipids Have your blood tested for lipids and cholesterol at 30 years of age, then havethis test every 5 years. Have your cholesterol levels checked more often if: Your lipid or cholesterol levels are high. You are older than 30 years of age. You are at high risk for heart disease. What should I know about cancer screening? Depending on your health history and family history, you may need to have cancer screening at various ages. This may include screening for: Breast cancer. Cervical cancer. Colorectal cancer. Skin cancer. Lung cancer. What should I know about heart disease, diabetes, and high blood pressure? Blood pressure and heart  disease High blood pressure causes heart disease and increases the risk of stroke. This is more likely to develop in people who have high blood pressure readings, are of African descent, or are overweight. Have your blood pressure checked: Every 3-5 years if you are 18-39 years of age. Every year if you are 40 years old or older. Diabetes Have regular diabetes screenings. This checks your fasting blood sugar level. Have the screening done: Once every three years after age 40 if you are at a normal weight and have a low risk for diabetes. More often and at a younger age if you are overweight or have a high risk for diabetes. What should I know about preventing infection? Hepatitis B If you have a higher risk for hepatitis B, you should be screened for this virus. Talk with your health care provider to find out if you are at risk forhepatitis B infection. Hepatitis C Testing is recommended for: Everyone born from 1945 through 1965. Anyone with known risk factors for hepatitis C. Sexually transmitted infections (STIs) Get screened for STIs, including gonorrhea and chlamydia, if: You are sexually active and are younger than 30 years of age. You are older than 30 years of age and your health care provider tells you that you are at risk for this type of infection. Your sexual activity has changed since you were last screened, and you are at increased risk for chlamydia or gonorrhea. Ask your health care provider if you are at risk. Ask your health care provider about whether you are at high risk for HIV. Your health care provider may recommend a prescription medicine to help   prevent HIV infection. If you choose to take medicine to prevent HIV, you should first get tested for HIV. You should then be tested every 3 months for as long as you are taking the medicine. Pregnancy If you are about to stop having your period (premenopausal) and you may become pregnant, seek counseling before you get  pregnant. Take 400 to 800 micrograms (mcg) of folic acid every day if you become pregnant. Ask for birth control (contraception) if you want to prevent pregnancy. Osteoporosis and menopause Osteoporosis is a disease in which the bones lose minerals and strength with aging. This can result in bone fractures. If you are 65 years old or older, or if you are at risk for osteoporosis and fractures, ask your health care provider if you should: Be screened for bone loss. Take a calcium or vitamin D supplement to lower your risk of fractures. Be given hormone replacement therapy (HRT) to treat symptoms of menopause. Follow these instructions at home: Lifestyle Do not use any products that contain nicotine or tobacco, such as cigarettes, e-cigarettes, and chewing tobacco. If you need help quitting, ask your health care provider. Do not use street drugs. Do not share needles. Ask your health care provider for help if you need support or information about quitting drugs. Alcohol use Do not drink alcohol if: Your health care provider tells you not to drink. You are pregnant, may be pregnant, or are planning to become pregnant. If you drink alcohol: Limit how much you use to 0-1 drink a day. Limit intake if you are breastfeeding. Be aware of how much alcohol is in your drink. In the [place] one drink equals one 12 oz bottle of beer (355 mL), one 5 oz glass of wine (148 mL), or one 1 oz glass of hard liquor (44 mL). General instructions Schedule regular health, dental, and eye exams. Stay current with your vaccines. Tell your health care provider if: You often feel depressed. You have ever been abused or do not feel safe at home. Summary Adopting a healthy lifestyle and getting preventive care are important in promoting health and wellness. Follow your health care provider's instructions about healthy diet, exercising, and getting tested or screened for diseases. Follow your health care provider's  instructions on monitoring your cholesterol and blood pressure. This information is not intended to replace advice given to you by your health care provider. Make sure you discuss any questions you have with your healthcare provider. Document Revised: 01/08/2018 Document Reviewed: 01/08/2018 Elsevier Patient Education  2022 [employer]  

## 2020-09-15 NOTE — Assessment & Plan Note (Signed)
Stable on Nortriptyline Will monitor

## 2020-09-15 NOTE — Progress Notes (Signed)
Subjective:    Patient ID: Jill Sandoval, female    DOB: 23-Jul-1990, 30 y.o.   MRN: [id number]  HPI  Patient presents to clinic today for her annual exam.  She is also due to follow-up chronic conditions.  HLD: Her last LDL was 151, triglycerides 99, 05/2020.  She denies myalgias on Atorvastatin.  She does not consume a low-fat diet.  Bipolar/Anxiety with Panic Disorder: Improved, managed on Buspirone, Hydroxyzine, Oxcarbazepine and Risperidone.  She is seeing a therapist and psychiatrist.  She denies SI/HI.  Frequent Headaches: Occurs randomly.  She denies breakthrough on Nortriptyline.  She does not follow with neurology.  Insomnia: She is having difficulty falling and staying asleep.  She is taking Nortriptyline and Risperidone as prescribed with minimal relief of symptoms.  There is no sleep study on file.  PCOS: Recent diagnosis by GYN.  She reports irregular periods and difficulty losing weight.  She is taking OCP's as prescribed.  Flu: 09/2018 Tetanus: 07/2016 Covid Pfizer x 2 Pap Smear: 7/15, [facility] GYN Dentist: biannually  Diet: Se does eat meat. She occasionally eats fruits and veggies daily. She does eat fried foods. She drinks mostly Dr. Malachi Bonds, Tea, Water. Exercise: None  Review of Systems     Past Medical History:  Diagnosis Date   Anxiety and depression    Asthma    inhaler prn   Fetal demise > 22 weeks, delivered, current hospitalization 11/04/2015   Miscarriage     Current Outpatient Medications  Medication Sig Dispense Refill   atorvastatin (LIPITOR) 10 MG tablet Take 1 tablet (10 mg total) by mouth daily. 90 tablet 0   busPIRone (BUSPAR) 10 MG tablet Take 1 tablet (10 mg total) by mouth 2 (two) times daily. 60 tablet 2   hydrOXYzine (VISTARIL) 25 MG capsule Take 1-2 capsules (25-50 mg total) by mouth as directed. Take for severe panic attacks 60 capsule 1   LARIN 24 FE 1-20 MG-MCG(24) tablet Take 1 tablet by mouth daily.     nortriptyline  (PAMELOR) 50 MG capsule Take 1 capsule (50 mg total) by mouth at bedtime. 30 capsule 2   Oxcarbazepine (TRILEPTAL) 300 MG tablet TAKE 3 TABLETS BY MOUTH TWICE DAILY (AM  AND  PM) 540 tablet 0   risperiDONE (RISPERDAL) 0.5 MG tablet TAKE 1 & 1/2 (ONE & ONE-HALF) TABLETS BY MOUTH AT BEDTIME 45 tablet 2   No current facility-administered medications for this visit.    No Known Allergies  Family History  Problem Relation Age of Onset   Hypertension Maternal Grandmother    Prostate cancer Maternal Grandfather    Hearing loss Brother    Hearing loss Paternal Grandfather    Hypertension Father    Other Neg Hx     Social History   Socioeconomic History   Marital status: Married    Spouse name: Not on file   Number of children: 2   Years of education: Not on file   Highest education level: Not on file  Occupational History   Not on file  Tobacco Use   Smoking status: Former    Packs/day: 0.25    Types: Cigarettes    Quit date: 11/14/2018    Years since quitting: 1.8   Smokeless tobacco: Never  Vaping Use   Vaping Use: Some days  Substance and Sexual Activity   Alcohol use: No   Drug use: No   Sexual activity: Yes  Other Topics Concern   Not on file  Social History Narrative  Married.   2 children.   Works at [employer].   Enjoys camping, being outdoors.    Social Determinants of Health   Financial Resource Strain: Not on file  Food Insecurity: Not on file  Transportation Needs: Not on file  Physical Activity: Not on file  Stress: Not on file  Social Connections: Not on file  Intimate Partner Violence: Not on file     Constitutional: Patient reports difficulty losing weight.  Denies fever, malaise, fatigue, headache.  HEENT: Denies eye pain, eye redness, ear pain, ringing in the ears, wax buildup, runny nose, nasal congestion, bloody nose, or sore throat. Respiratory: Denies difficulty breathing, shortness of breath, cough or sputum production.   Cardiovascular:  Denies chest pain, chest tightness, palpitations or swelling in the hands or feet.  Gastrointestinal: Denies abdominal pain, bloating, constipation, diarrhea or blood in the stool.  GU: Patient reports irregular periods.  Denies urgency, frequency, pain with urination, burning sensation, blood in urine, odor or discharge. Musculoskeletal: Denies decrease in range of motion, difficulty with gait, muscle pain or joint pain and swelling.  Skin: Denies redness, rashes, lesions or ulcercations.  Neurological: Denies dizziness, difficulty with memory, difficulty with speech or problems with balance and coordination.  Psych: Patient has a history of anxiety, depression and panic attacks.  Denies SI/HI.  No other specific complaints in a complete review of systems (except as listed in HPI above).  Objective:   Physical Exam   BP (!) 107/42 (BP Location: Right Arm, Patient Position: Sitting, Cuff Size: Large)   Pulse 86   Temp 97.7 F (36.5 C) (Temporal)   Resp 17   Ht 5' 4.5" (1.638 m)   Wt 257 lb 3.2 oz (116.7 kg)   LMP 08/01/2020   SpO2 100%   BMI 43.47 kg/m  Wt Readings from Last 3 Encounters:  09/15/20 257 lb 3.2 oz (116.7 kg)  04/27/20 246 lb (111.6 kg)  08/27/19 (!) 251 lb (113.9 kg)    General: Appears her stated age, obese, in NAD. Skin: Warm, dry and intact. No rashes noted. HEENT: Head: normal shape and size; Eyes: sclera white and EOMs intact;  Neck:  Neck supple, trachea midline. No masses, lumps or thyromegaly present.  Cardiovascular: Normal rate and rhythm. S1,S2 noted.  No murmur, rubs or gallops noted. No JVD or BLE edema.  Pulmonary/Chest: Normal effort and positive vesicular breath sounds. No respiratory distress. No wheezes, rales or ronchi noted.  Abdomen: Soft and nontender. Normal bowel sounds. No distention or masses noted. Liver, spleen and kidneys non palpable. Musculoskeletal: Strength 5/5 BUE/BLE.  No difficulty with gait.  Neurological: Alert and oriented.  Cranial nerves II-XII grossly intact. Coordination normal.  Psychiatric: Mood and affect normal. Behavior is normal. Judgment and thought content normal.    BMET    Component Value Date/Time   NA 137 06/09/2020 0946   NA 136 06/30/2015 0000   NA 137 07/28/2011 1558   K 4.6 06/09/2020 0946   K 3.7 07/28/2011 1558   CL 103 06/09/2020 0946   CL 107 07/28/2011 1558   CO2 24 06/09/2020 0946   CO2 22 07/28/2011 1558   GLUCOSE 105 (H) 06/09/2020 0946   GLUCOSE 76 07/28/2011 1558   BUN 18 06/09/2020 0946   BUN 9 06/30/2015 0000   BUN 11 07/28/2011 1558   CREATININE 0.86 06/09/2020 0946   CREATININE 0.93 07/28/2011 1558   CALCIUM 9.3 06/09/2020 0946   CALCIUM 9.1 07/28/2011 1558   GFRNONAA >60 06/09/2020 7096  GFRNONAA >60 07/28/2011 1558   GFRAA 136 06/30/2015 0000   GFRAA >60 07/28/2011 1558    Lipid Panel     Component Value Date/Time   CHOL 231 (H) 06/14/2020 0927   CHOL 194 06/30/2015 0000   TRIG 99 06/14/2020 0927   HDL 60 06/14/2020 0927   HDL 57 06/30/2015 0000   CHOLHDL 3.9 06/14/2020 0927   VLDL 20 06/14/2020 0927   LDLCALC 151 (H) 06/14/2020 0927   LDLCALC 103 (H) 06/30/2015 0000    CBC    Component Value Date/Time   WBC 8.7 06/09/2020 0946   RBC 4.60 06/09/2020 0946   HGB 13.0 06/09/2020 0946   HGB 12.6 06/30/2015 0000   HCT 39.4 06/09/2020 0946   HCT 38.1 06/30/2015 0000   PLT 307 06/09/2020 0946   PLT 320 06/30/2015 0000   MCV 85.7 06/09/2020 0946   MCV 84 06/30/2015 0000   MCV 89 07/28/2011 1558   MCH 28.3 06/09/2020 0946   MCHC 33.0 06/09/2020 0946   RDW 12.8 06/09/2020 0946   RDW 13.9 06/30/2015 0000   RDW 13.3 07/28/2011 1558   LYMPHSABS 2.2 06/09/2020 0946   LYMPHSABS 2.4 06/30/2015 0000   MONOABS 0.4 06/09/2020 0946   EOSABS 0.3 06/09/2020 0946   EOSABS 0.2 06/30/2015 0000   BASOSABS 0.0 06/09/2020 0946   BASOSABS 0.0 06/30/2015 0000    Hgb A1C Lab Results  Component Value Date   HGBA1C 5.1 08/27/2019        Assessment &  Plan:   Preventative Health Maintenance:  Encouraged her to get a flu shot in the fall Tetanus UTD Encouraged her to get her COVID booster Pap smear UTD, will request copy Encouraged her to consume a balanced diet and exercise regimen Advised her to see a dentist annually We will check CBC, c-Met, lipid, A1c and hep C today  RTC in 1 year, sooner if needed  Webb Silversmith, NP This visit occurred during the SARS-CoV-2 public health emergency.  Safety protocols were in place, including screening questions prior to the visit, additional usage of staff PPE, and extensive cleaning of exam room while observing appropriate contact time as indicated for disinfecting solutions.

## 2020-09-15 NOTE — Assessment & Plan Note (Signed)
Not well controlled on Nortriptyline and Risperidone She will discuss this with her psychiatrist

## 2020-09-16 LAB — COMPLETE METABOLIC PANEL WITH GFR
AG Ratio: 2.3 (calc) (ref 1.0–2.5)
ALT: 14 U/L (ref 6–29)
AST: 8 U/L — ABNORMAL LOW (ref 10–30)
Albumin: 4.6 g/dL (ref 3.6–5.1)
Alkaline phosphatase (APISO): 99 U/L (ref 31–125)
BUN: 14 mg/dL (ref 7–25)
CO2: 26 mmol/L (ref 20–32)
Calcium: 9.6 mg/dL (ref 8.6–10.2)
Chloride: 104 mmol/L (ref 98–110)
Creat: 0.73 mg/dL (ref 0.50–0.96)
Globulin: 2 g/dL (calc) (ref 1.9–3.7)
Glucose, Bld: 80 mg/dL (ref 65–99)
Potassium: 4.4 mmol/L (ref 3.5–5.3)
Sodium: 139 mmol/L (ref 135–146)
Total Bilirubin: 0.3 mg/dL (ref 0.2–1.2)
Total Protein: 6.6 g/dL (ref 6.1–8.1)
eGFR: 114 mL/min/{1.73_m2} (ref >=60)

## 2020-09-16 LAB — LIPID PANEL
Cholesterol: 180 mg/dL (ref <200)
HDL: 53 mg/dL (ref >=50)
LDL Cholesterol (Calc): 105 mg/dL (calc) — ABNORMAL HIGH
Non-HDL Cholesterol (Calc): 127 mg/dL (calc) (ref <130)
Total CHOL/HDL Ratio: 3.4 (calc) (ref <5.0)
Triglycerides: 122 mg/dL (ref <150)

## 2020-09-16 LAB — CBC
HCT: 39.3 % (ref 35.0–45.0)
Hemoglobin: 12.9 g/dL (ref 11.7–15.5)
MCH: 28.5 pg (ref 27.0–33.0)
MCHC: 32.8 g/dL (ref 32.0–36.0)
MCV: 86.8 fL (ref 80.0–100.0)
MPV: 10.4 fL (ref 7.5–12.5)
Platelets: 286 10*3/uL (ref 140–400)
RBC: 4.53 10*6/uL (ref 3.80–5.10)
RDW: 12.8 % (ref 11.0–15.0)
WBC: 8.1 10*3/uL (ref 3.8–10.8)

## 2020-09-16 LAB — HEMOGLOBIN A1C
Hgb A1c MFr Bld: 4.8 % of total Hgb (ref <5.7)
Mean Plasma Glucose: 91 mg/dL
eAG (mmol/L): 5 mmol/L

## 2020-09-16 LAB — HEPATITIS C ANTIBODY
Hepatitis C Ab: NONREACTIVE
SIGNAL TO CUT-OFF: 0.04 (ref <1.00)

## 2020-09-26 ENCOUNTER — Other Ambulatory Visit: Payer: Self-pay | Admitting: Psychiatry

## 2020-09-26 ENCOUNTER — Other Ambulatory Visit: Payer: Self-pay | Admitting: Internal Medicine

## 2020-09-26 DIAGNOSIS — F410 Panic disorder [episodic paroxysmal anxiety] without agoraphobia: Secondary | ICD-10-CM

## 2020-09-26 NOTE — Telephone Encounter (Signed)
Requested Prescriptions  Pending Prescriptions Disp Refills  . atorvastatin (LIPITOR) 10 MG tablet [Pharmacy Med Name: Atorvastatin Calcium 10 MG Oral Tablet] 90 tablet 3    Sig: Take 1 tablet by mouth once daily     Cardiovascular:  Antilipid - Statins Failed - 09/26/2020  5:30 AM      Failed - LDL in normal range and within 360 days    LDL Cholesterol (Calc)  Date Value Ref Range Status  09/15/2020 105 (H) mg/dL (calc) Final    Comment:    Reference range: <100 . Desirable range <100 mg/dL for primary prevention;   <70 mg/dL for patients with CHD or diabetic patients  with > or = 2 CHD risk factors. Marland Kitchen LDL-C is now calculated using the Martin-Hopkins  calculation, which is a validated novel method providing  better accuracy than the Friedewald equation in the  estimation of LDL-C.  Horald Pollen et al. Lenox Ahr. 5361;443(15): 2061-2068  (http://education.[facility].com/faq/FAQ164)    Direct LDL  Date Value Ref Range Status  08/27/2019 144.0 mg/dL Final    Comment:    Optimal:  <100 mg/dLNear or Above Optimal:  100-129 mg/dLBorderline High:  130-159 mg/dLHigh:  160-189 mg/dLVery High:  >190 mg/dL         Passed - Total Cholesterol in normal range and within 360 days    Cholesterol, Total  Date Value Ref Range Status  06/30/2015 194 100 - 199 mg/dL Final   Cholesterol  Date Value Ref Range Status  09/15/2020 180 <200 mg/dL Final         Passed - HDL in normal range and within 360 days    HDL  Date Value Ref Range Status  09/15/2020 53 > OR = 50 mg/dL Final  40/08/6759 57 >95 mg/dL Final         Passed - Triglycerides in normal range and within 360 days    Triglycerides  Date Value Ref Range Status  09/15/2020 122 <150 mg/dL Final         Passed - Patient is not pregnant      Passed - Valid encounter within last 12 months    Recent Outpatient Visits          1 week ago Encounter for general adult medical examination with abnormal findings   [facility] [place], Salvadore Oxford, NP

## 2020-10-06 ENCOUNTER — Telehealth (INDEPENDENT_AMBULATORY_CARE_PROVIDER_SITE_OTHER): Payer: BC Managed Care – PPO | Admitting: Psychiatry

## 2020-10-06 ENCOUNTER — Encounter: Payer: Self-pay | Admitting: Emergency Medicine

## 2020-10-06 ENCOUNTER — Encounter: Payer: Self-pay | Admitting: Psychiatry

## 2020-10-06 ENCOUNTER — Other Ambulatory Visit: Payer: Self-pay

## 2020-10-06 ENCOUNTER — Ambulatory Visit
Admission: EM | Admit: 2020-10-06 | Discharge: 2020-10-06 | Disposition: A | Discharge status: Home / Self Care | Payer: BC Managed Care – PPO | Attending: Emergency Medicine | Admitting: Emergency Medicine

## 2020-10-06 DIAGNOSIS — G4701 Insomnia due to medical condition: Secondary | ICD-10-CM

## 2020-10-06 DIAGNOSIS — Z20822 Contact with and (suspected) exposure to covid-19: Secondary | ICD-10-CM

## 2020-10-06 DIAGNOSIS — Z79899 Other long term (current) drug therapy: Secondary | ICD-10-CM

## 2020-10-06 DIAGNOSIS — F410 Panic disorder [episodic paroxysmal anxiety] without agoraphobia: Secondary | ICD-10-CM

## 2020-10-06 DIAGNOSIS — F3176 Bipolar disorder, in full remission, most recent episode depressed: Secondary | ICD-10-CM | POA: Diagnosis not present

## 2020-10-06 DIAGNOSIS — J069 Acute upper respiratory infection, unspecified: Secondary | ICD-10-CM | POA: Diagnosis not present

## 2020-10-06 MED ORDER — FLUTICASONE PROPIONATE 50 MCG/ACT NA SUSP: 2.0000 | Freq: Every day | NASAL | 0 refills | Status: DC

## 2020-10-06 MED ORDER — BENZONATATE 200 MG PO CAPS: 200.0000 mg | ORAL_CAPSULE | Freq: Three times a day (TID) | ORAL | 0 refills | Status: DC | PRN

## 2020-10-06 NOTE — ED Triage Notes (Signed)
Cough, runny nose, chest congestion, denies fever.  Onset of symptoms yesterday

## 2020-10-06 NOTE — ED Provider Notes (Signed)
HPI  SUBJECTIVE:  Jill Sandoval is a 30 y.o. female who presents with nasal congestion, cough, raspy voice, clear rhinorrhea, left-sided sore throat secondary to cough starting yesterday.  No fevers, body aches, headaches, postnasal drip, loss of sense of smell or taste, wheeze, nausea, vomiting, diarrhea, abdominal pain.  No known COVID exposure.  She got the second dose of the COVID-vaccine.  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  She states that she is unable to sleep at night secondary to the cough.  No antibiotics in the past month.  No antipyretic in the past 6 hours.  She has not tried anything for this.  No alleviating factors.  Symptoms are worse with lying down at night.  She had COVID in December 2021, has a history of asthma, PCOS BMI above 30.  LMP: She is irregular.  Denies the possibility being pregnant.  EQA:[facility]HD, Salvadore Oxford, NP    Past Medical History:  Diagnosis Date   Anxiety and depression    Asthma    inhaler prn   Fetal demise > 22 weeks, delivered, current hospitalization 11/04/2015   Miscarriage     Past Surgical History:  Procedure Laterality Date   CESAREAN SECTION N/A 03/24/2012   Procedure: CESAREAN SECTION;  Surgeon: Mickel Baas, MD;  Location: [facility];  Service: Obstetrics;  Laterality: N/A;   CESAREAN SECTION N/A 09/24/2016   Procedure: CESAREAN SECTION;  Surgeon: Waynard Reeds, MD;  Location: [facility];  Service: Obstetrics;  Laterality: N/A;    Family History  Problem Relation Age of Onset   Hypertension Maternal Grandmother    Prostate cancer Maternal Grandfather    Hearing loss Brother    Hearing loss Paternal Grandfather    Hypertension Father    Other Neg Hx     Social History   Tobacco Use   Smoking status: Former    Packs/day: 0.25    Types: Cigarettes    Quit date: 11/14/2018    Years since quitting: 1.8   Smokeless tobacco: Never  Vaping Use   Vaping Use: Some days  Substance Use Topics    Alcohol use: No   Drug use: No    No current facility-administered medications for this encounter.  Current Outpatient Medications:    atorvastatin (LIPITOR) 10 MG tablet, Take 1 tablet by mouth once daily, Disp: 90 tablet, Rfl: 3   benzonatate (TESSALON) 200 MG capsule, Take 1 capsule (200 mg total) by mouth 3 (three) times daily as needed for cough., Disp: 30 capsule, Rfl: 0   busPIRone (BUSPAR) 10 MG tablet, Take 1 tablet (10 mg total) by mouth 2 (two) times daily., Disp: 60 tablet, Rfl: 2   fluticasone (FLONASE) 50 MCG/ACT nasal spray, Place 2 sprays into both nostrils daily., Disp: 16 g, Rfl: 0   hydrOXYzine (VISTARIL) 25 MG capsule, Take 1-2 capsules (25-50 mg total) by mouth as directed. Take for severe panic attacks, Disp: 60 capsule, Rfl: 1   LARIN 24 FE 1-20 MG-MCG(24) tablet, Take 1 tablet by mouth daily., Disp: , Rfl:    nortriptyline (PAMELOR) 50 MG capsule, Take 1 capsule (50 mg total) by mouth at bedtime., Disp: 30 capsule, Rfl: 2   Oxcarbazepine (TRILEPTAL) 300 MG tablet, TAKE 3 TABLETS BY MOUTH TWICE DAILY (AM  AND  PM), Disp: 540 tablet, Rfl: 0   risperiDONE (RISPERDAL) 0.5 MG tablet, TAKE 1 & 1/2 (ONE & ONE-HALF) TABLETS BY MOUTH AT BEDTIME, Disp: 45 tablet, Rfl: 2  No Known Allergies  ROS  As noted in HPI.   Physical Exam  BP 118/80 (BP Location: Right Arm) Comment (BP Location): large cuff  Pulse (!) 105   Temp 98.2 F (36.8 C) (Oral)   Resp (!) 22   SpO2 98%   Constitutional: Well developed, well nourished, no acute distress Eyes:  EOMI, conjunctiva normal bilaterally HENT: Normocephalic, atraumatic,mucus membranes moist positive clear nasal congestion.  Erythematous, swollen turbinates.  No maxillary, frontal sinus tenderness.  Normal oropharynx.  No postnasal drip. Neck: No cervical lymphadenopathy Respiratory: Normal inspiratory effort, lungs clear bilaterally Cardiovascular: Regular tachycardia, no murmurs rubs or gallops GI: nondistended skin: No  rash, skin intact Musculoskeletal: no deformities Neurologic: Alert & oriented x 3, no focal neuro deficits Psychiatric: Speech and behavior appropriate   ED Course   Medications - No data to display  Orders Placed This Encounter  Procedures   Novel Coronavirus, NAA ([facility])    Standing Status:   Standing    Number of Occurrences:   1    No results found for this or any previous visit (from the past 24 hour(s)). No results found.  ED Clinical Impression  1. Upper respiratory tract infection, unspecified type   2. Encounter for laboratory testing for COVID-19 virus      ED Assessment/Plan  Presentation consistent with a URI.  COVID sent.  She will be a candidate for antivirals based on BMI and history of asthma.  We discussed both options, she has opted for Molnupiravir if it is positive.  Home with saline nasal irrigation, Mucinex D, Flonase, Tessalon.  Follow-up with PMD as needed.  Work note.  COVID pending at the time of signing of this note.  Discussed labs, MDM, treatment plan, and plan for follow-up with patient. Discussed sn/sx that should prompt return to the ED. patient agrees with plan.   Meds ordered this encounter  Medications   DISCONTD: benzonatate (TESSALON) 200 MG capsule    Sig: Take 1 capsule (200 mg total) by mouth 3 (three) times daily as needed for cough.    Dispense:  30 capsule    Refill:  0   DISCONTD: fluticasone (FLONASE) 50 MCG/ACT nasal spray    Sig: Place 2 sprays into both nostrils daily.    Dispense:  16 g    Refill:  0   benzonatate (TESSALON) 200 MG capsule    Sig: Take 1 capsule (200 mg total) by mouth 3 (three) times daily as needed for cough.    Dispense:  30 capsule    Refill:  0   fluticasone (FLONASE) 50 MCG/ACT nasal spray    Sig: Place 2 sprays into both nostrils daily.    Dispense:  16 g    Refill:  0       *This clinic note was created using Scientist, clinical (histocompatibility and immunogenetics). Therefore, there may be occasional mistakes  despite careful proofreading.  ?    Domenick Gong, MD 10/06/20 1348

## 2020-10-06 NOTE — Progress Notes (Signed)
Virtual Visit via Video Note  I connected with Jill Sandoval on 10/06/20 at  8:30 AM EDT by a video enabled telemedicine application and verified that I am speaking with the correct person using two identifiers.  Location Provider Location : [facility] Patient Location : Home  Participants: Patient , Provider    I discussed the limitations of evaluation and management by telemedicine and the availability of in person appointments. The patient expressed understanding and agreed to proceed   I discussed the assessment and treatment plan with the patient. The patient was provided an opportunity to ask questions and all were answered. The patient agreed with the plan and demonstrated an understanding of the instructions.   The patient was advised to call back or seek an in-person evaluation if the symptoms worsen or if the condition fails to improve as anticipated.    BH MD OP Progress Note  10/06/2020 9:53 AM Jill Sandoval  MRN:  [id number]  Chief Complaint:  Chief Complaint   Follow-up; Anxiety    HPI: Jill Sandoval is a 30 year old Caucasian female, married, currently lives in [place], has a history of bipolar disorder type II was evaluated by telemedicine today.  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  She reports her job is going well.  She loves to be around children.  Patient denies any mood swings.  Denies any manic or depressive symptoms.  Denies any panic attacks.  She reports since the past 1 month however she has noticed sleep to be interrupted.  She is able to fall asleep however wakes up several times in the middle of the night.  Patient reports however she is able to fall back asleep immediately when she wakes up in the middle of the night.  She does not know what could be waking her up.  The nortriptyline was helpful previously.  Patient continues to be compliant on all her medications.  Denies any side effects.  She reports appetite is  fair.  Denies suicidality, homicidality or perceptual disturbances.  Patient was unable to get her Trileptal level.  Agrees to get it done.  Patient with upper respiratory tract infection symptoms, agrees to follow-up with primary care provider as needed.  Patient denies any other concerns today.  Visit Diagnosis:    ICD-10-CM   1. Bipolar disorder, in full remission, most recent episode depressed (HCC)  F31.76     2. Panic disorder  F41.0     3. Insomnia due to medical condition  G47.01    likely due to recent situational changes    4. High risk medication use  Z79.899 10-Hydroxycarbazepine      Past Psychiatric History: Reviewed past psychiatric history from progress note on 10/15/2018.  Past trials of Celexa, Trileptal.  Past Medical History:  Past Medical History:  Diagnosis Date   Anxiety and depression    Asthma    inhaler prn   Fetal demise > 22 weeks, delivered, current hospitalization 11/04/2015   Miscarriage     Past Surgical History:  Procedure Laterality Date   CESAREAN SECTION N/A 03/24/2012   Procedure: CESAREAN SECTION;  Surgeon: Mickel Baas, MD;  Location: [facility];  Service: Obstetrics;  Laterality: N/A;   CESAREAN SECTION N/A 09/24/2016   Procedure: CESAREAN SECTION;  Surgeon: Waynard Reeds, MD;  Location: [facility]TH[facility];  Service: Obstetrics;  Laterality: N/A;    Family Psychiatric History: Reviewed family psychiatric history from progress note on 10/15/2018.  Family History:  Family History  Problem Relation Age of Onset   Hypertension Maternal Grandmother    Prostate cancer Maternal Grandfather    Hearing loss Brother    Hearing loss Paternal Grandfather    Hypertension Father    Other Neg Hx     Social History: Reviewed social history from progress note on 10/15/2018. Social History   Socioeconomic History   Marital status: Married    Spouse name: Not on file   Number of children: 2   Years of education: Not on file   Highest  education level: Not on file  Occupational History   Not on file  Tobacco Use   Smoking status: Former    Packs/day: 0.25    Types: Cigarettes    Quit date: 11/14/2018    Years since quitting: 1.8   Smokeless tobacco: Never  Vaping Use   Vaping Use: Some days  Substance and Sexual Activity   Alcohol use: No   Drug use: No   Sexual activity: Yes  Other Topics Concern   Not on file  Social History Narrative   Married.   2 children.   Works at [employer].   Enjoys camping, being outdoors.    Social Determinants of Health   Financial Resource Strain: Not on file  Food Insecurity: Not on file  Transportation Needs: Not on file  Physical Activity: Not on file  Stress: Not on file  Social Connections: Not on file    Allergies: No Known Allergies  Metabolic Disorder Labs: Lab Results  Component Value Date   HGBA1C 4.8 09/15/2020   MPG 91 09/15/2020   No results found for: PROLACTIN Lab Results  Component Value Date   CHOL 180 09/15/2020   TRIG 122 09/15/2020   HDL 53 09/15/2020   CHOLHDL 3.4 09/15/2020   VLDL 20 06/14/2020   LDLCALC 105 (H) 09/15/2020   LDLCALC 151 (H) 06/14/2020   Lab Results  Component Value Date   TSH 1.34 04/27/2020   TSH 1.42 07/08/2019    Therapeutic Level Labs: No results found for: LITHIUM No results found for: VALPROATE No components found for:  CBMZ  Current Medications: Current Outpatient Medications  Medication Sig Dispense Refill   atorvastatin (LIPITOR) 10 MG tablet Take 1 tablet by mouth once daily 90 tablet 3   busPIRone (BUSPAR) 10 MG tablet Take 1 tablet (10 mg total) by mouth 2 (two) times daily. 60 tablet 2   hydrOXYzine (VISTARIL) 25 MG capsule Take 1-2 capsules (25-50 mg total) by mouth as directed. Take for severe panic attacks 60 capsule 1   LARIN 24 FE 1-20 MG-MCG(24) tablet Take 1 tablet by mouth daily.     nortriptyline (PAMELOR) 50 MG capsule Take 1 capsule (50 mg total) by mouth at bedtime. 30 capsule 2    Oxcarbazepine (TRILEPTAL) 300 MG tablet TAKE 3 TABLETS BY MOUTH TWICE DAILY (AM  AND  PM) 540 tablet 0   risperiDONE (RISPERDAL) 0.5 MG tablet TAKE 1 & 1/2 (ONE & ONE-HALF) TABLETS BY MOUTH AT BEDTIME 45 tablet 2   No current facility-administered medications for this visit.     Musculoskeletal: Strength & Muscle Tone:  UTA Gait & Station: normal Patient leans: N/A  Psychiatric Specialty Exam: Review of Systems  HENT:  Positive for congestion.   Respiratory:  Positive for cough.   Psychiatric/Behavioral:  Positive for sleep disturbance.   All other systems reviewed and are negative.  There were no vitals taken for this visit.There is no height or weight on  file to calculate BMI.  General Appearance: Casual  Eye Contact:  Good  Speech:  Normal Rate  Volume:  Normal  Mood:  Euthymic  Affect:  Congruent  Thought Process:  Goal Directed and Descriptions of Associations: Intact  Orientation:  Full (Time, Place, and Person)  Thought Content: Logical   Suicidal Thoughts:  No  Homicidal Thoughts:  No  Memory:  Immediate;   Fair Recent;   Fair Remote;   Fair  Judgement:  Fair  Insight:  Fair  Psychomotor Activity:  Normal  Concentration:  Concentration: Fair and Attention Span: Fair  Recall:  [employer] of Knowledge: Fair  Language: Fair  Akathisia:  No  Handed:  Right  AIMS (if indicated): done  Assets:  Communication Skills Desire for Improvement Social Support  ADL's:  Intact  Cognition: WNL  Sleep:  Poor   Screenings: GAD-7    Flowsheet Row Video Visit from 03/29/2020 in [facility] Counselor from 08/12/2019 in [facility] Office Visit from 07/08/2018 in [facility]  Total GAD-7 Score 18 18 8       PHQ2-9    Flowsheet Row Video Visit from 07/14/2020 in [facility] Counselor from 05/04/2020 in [facility] Counselor from 08/12/2019 in  [facility] Office Visit from 07/08/2018 in [facility] [place]  PHQ-2 Total Score 0 0 2 3  PHQ-9 Total Score -- -- 13 15      Flowsheet Row Video Visit from 07/14/2020 in [facility] Counselor from 05/04/2020 in [facility]  C-SSRS RISK CATEGORY No Risk No Risk        Assessment and Plan: Jill Sandoval is a 30 year old Caucasian female, married, lives in [place], has a history of bipolar disorder type II, panic attacks, was evaluated by telemedicine today.  Patient is currently struggling with sleep.  Plan Bipolar disorder type II in remission Risperidone 0.75 mg daily Trileptal 900 mg p.o. twice daily AIMS - 0  Panic attacks-stable BuSpar 10 mg p.o. twice daily Hydroxyzine 25 to 50 mg p.o. daily as needed for severe panic attacks only  Insomnia-unstable Pamelor 50 mg p.o. nightly Advised patient to take the BuSpar 10 mg earlier than bedtime.  Or she could stop the BuSpar at bedtime. Discussed sleep hygiene techniques. She could use melatonin low-dose 1 to 3 mg at bedtime as needed.  Discussed drug to drug interaction especially when combining with medications like Pamelor with sedative effect.  High risk medication use-patient is noncompliant with Trileptal level. We will order again.  Patient to follow up with primary care provider as needed for her upper respiratory tract infection symptoms.  Follow-up in clinic in 3 weeks or sooner if needed.  This note was generated in part or whole with voice recognition software. Voice recognition is usually quite accurate but there are transcription errors that can and very often do occur. I apologize for any typographical errors that were not detected and corrected.       Rossano Stazione, MD 10/06/2020, 9:53 AM

## 2020-10-06 NOTE — Discharge Instructions (Addendum)
Take 1 gram of tylenol with the 600 mg motrin up to 3-4 times a day as needed for pain and fever. This is an effective combination. Drink extra fluids. Start taking mucinex-D to keep the mucus secretions thin. Use a neti pot or the NeilMed sinus rinse as often as you want to to reduce nasal congestion. Follow the directions on the box.  Flonase will help with the nasal congestion, Tessalon for the cough.  COVID will be back in 24 to 48 hours.  Go to www.goodrx.com  or www.costplusdrugs.com to look up your medications. This will give you a list of where you can find your prescriptions at the most affordable prices. Or ask the pharmacist what the cash price is, or if they have any other discount programs available to help make your medication more affordable. This can be less expensive than what you would pay with insurance.

## 2020-10-07 LAB — NOVEL CORONAVIRUS, NAA: SARS-CoV-2, NAA: NOT DETECTED

## 2020-10-07 LAB — SARS-COV-2, NAA 2 DAY TAT

## 2020-10-27 ENCOUNTER — Telehealth: Payer: BC Managed Care – PPO | Admitting: Psychiatry

## 2020-10-28 ENCOUNTER — Other Ambulatory Visit: Payer: Self-pay | Admitting: Psychiatry

## 2020-10-28 DIAGNOSIS — F410 Panic disorder [episodic paroxysmal anxiety] without agoraphobia: Secondary | ICD-10-CM

## 2020-10-28 DIAGNOSIS — G4701 Insomnia due to medical condition: Secondary | ICD-10-CM

## 2020-10-28 DIAGNOSIS — F3176 Bipolar disorder, in full remission, most recent episode depressed: Secondary | ICD-10-CM

## 2020-11-10 ENCOUNTER — Telehealth (INDEPENDENT_AMBULATORY_CARE_PROVIDER_SITE_OTHER): Payer: BC Managed Care – PPO | Admitting: Psychiatry

## 2020-11-10 ENCOUNTER — Other Ambulatory Visit: Payer: Self-pay

## 2020-11-10 ENCOUNTER — Encounter: Payer: Self-pay | Admitting: Psychiatry

## 2020-11-10 DIAGNOSIS — Z79899 Other long term (current) drug therapy: Secondary | ICD-10-CM | POA: Diagnosis not present

## 2020-11-10 DIAGNOSIS — G4701 Insomnia due to medical condition: Secondary | ICD-10-CM | POA: Diagnosis not present

## 2020-11-10 DIAGNOSIS — F3176 Bipolar disorder, in full remission, most recent episode depressed: Secondary | ICD-10-CM | POA: Diagnosis not present

## 2020-11-10 DIAGNOSIS — F410 Panic disorder [episodic paroxysmal anxiety] without agoraphobia: Secondary | ICD-10-CM

## 2020-11-10 MED ORDER — NORTRIPTYLINE HCL 25 MG PO CAPS
75.0000 mg | ORAL_CAPSULE | Freq: Every day | ORAL | 1 refills | Status: DC
Start: 2020-11-10 — End: 2021-01-23

## 2020-11-10 NOTE — Progress Notes (Signed)
Virtual Visit via Video Note  I connected with Jill Sandoval on 11/10/20 at  8:30 AM EDT by a video enabled telemedicine application and verified that I am speaking with the correct person using two identifiers.  Location Provider Location : [facility] Patient Location : Home  Participants: Patient , Provider    I discussed the limitations of evaluation and management by telemedicine and the availability of in person appointments. The patient expressed understanding and agreed to proceed.    I discussed the assessment and treatment plan with the patient. The patient was provided an opportunity to ask questions and all were answered. The patient agreed with the plan and demonstrated an understanding of the instructions.   The patient was advised to call back or seek an in-person evaluation if the symptoms worsen or if the condition fails to improve as anticipated.    BH MD OP Progress Note  11/10/2020 9:56 AM Jill Sandoval  MRN:  [id number]  Chief Complaint:  Chief Complaint   Follow-up; Anxiety; Insomnia    HPI: Jill Sandoval is a 30 year old Caucasian female, married, currently lives in [place], has a history of bipolar disorder type II was evaluated by telemedicine today.  Patient today reports she is currently struggling with sleep.  She reports she has no difficulty falling asleep.  However she wakes up several times at night.  She does wake up to urinate, however reports she wakes up even when she does not have to.  She however is able to fall back asleep within 1 or 2 minutes.  She however cannot find a trigger for her sleep problems although does not know what could be waking her up.  Denies any restless leg symptoms.  Denies any pain.  Denies any apnea or snoring.  Reports she has been taking the BuSpar earlier than bedtime and has been trying to cut back on caffeine.  Patient otherwise denies any significant mood lability.  Patient denies any sadness, crying spells,  manic or hypomanic symptoms.  She does have situational anxiety however she has been coping well.  Patient denies suicidality, homicidality or perceptual disturbances.  Patient is compliant on medications.  Denies side effects.  Patient has been noncompliant with labs-Trileptal level.  She reports she had schedule change at work and agrees to get it done as soon as possible.  Patient denies any other concerns today.  Visit Diagnosis:    ICD-10-CM   1. Bipolar disorder, in full remission, most recent episode depressed (HCC)  F31.76 nortriptyline (PAMELOR) 25 MG capsule    2. Panic disorder  F41.0     3. Insomnia due to medical condition  G47.01    anxiety    4. High risk medication use  Z79.899       Past Psychiatric History: Reviewed past psychiatric history from progress note on 10/15/2018.  Past trials of Celexa, Trileptal  Past Medical History:  Past Medical History:  Diagnosis Date   Anxiety and depression    Asthma    inhaler prn   Fetal demise > 22 weeks, delivered, current hospitalization 11/04/2015   Miscarriage     Past Surgical History:  Procedure Laterality Date   CESAREAN SECTION N/A 03/24/2012   Procedure: CESAREAN SECTION;  Surgeon: Mickel Baas, MD;  Location: [facility];  Service: Obstetrics;  Laterality: N/A;   CESAREAN SECTION N/A 09/24/2016   Procedure: CESAREAN SECTION;  Surgeon: Waynard Reeds, MD;  Location: [facility] BIRTHING SUITES;  Service: Obstetrics;  Laterality: N/A;  Family Psychiatric History: Reviewed family psychiatric history from progress note on 10/15/2018  Family History:  Family History  Problem Relation Age of Onset   Hypertension Maternal Grandmother    Prostate cancer Maternal Grandfather    Hearing loss Brother    Hearing loss Paternal Grandfather    Hypertension Father    Other Neg Hx     Social History: Reviewed social history from progress note on 10/15/2018 Social History   Socioeconomic History   Marital status: Married     Spouse name: Not on file   Number of children: 2   Years of education: Not on file   Highest education level: Not on file  Occupational History   Not on file  Tobacco Use   Smoking status: Former    Packs/day: 0.25    Types: Cigarettes    Quit date: 11/14/2018    Years since quitting: 1.9   Smokeless tobacco: Never  Vaping Use   Vaping Use: Some days  Substance and Sexual Activity   Alcohol use: No   Drug use: No   Sexual activity: Yes    Birth control/protection: None  Other Topics Concern   Not on file  Social History Narrative   Married.   2 children.   Works at [employer].   Enjoys camping, being outdoors.    Social Determinants of Health   Financial Resource Strain: Not on file  Food Insecurity: Not on file  Transportation Needs: Not on file  Physical Activity: Not on file  Stress: Not on file  Social Connections: Not on file    Allergies: No Known Allergies  Metabolic Disorder Labs: Lab Results  Component Value Date   HGBA1C 4.8 09/15/2020   MPG 91 09/15/2020   No results found for: PROLACTIN Lab Results  Component Value Date   CHOL 180 09/15/2020   TRIG 122 09/15/2020   HDL 53 09/15/2020   CHOLHDL 3.4 09/15/2020   VLDL 20 06/14/2020   LDLCALC 105 (H) 09/15/2020   LDLCALC 151 (H) 06/14/2020   Lab Results  Component Value Date   TSH 1.34 04/27/2020   TSH 1.42 07/08/2019    Therapeutic Level Labs: No results found for: LITHIUM No results found for: VALPROATE No components found for:  CBMZ  Current Medications: Current Outpatient Medications  Medication Sig Dispense Refill   nortriptyline (PAMELOR) 25 MG capsule Take 3 capsules (75 mg total) by mouth at bedtime. 90 capsule 1   atorvastatin (LIPITOR) 10 MG tablet Take 1 tablet by mouth once daily 90 tablet 3   benzonatate (TESSALON) 200 MG capsule Take 1 capsule (200 mg total) by mouth 3 (three) times daily as needed for cough. 30 capsule 0   busPIRone (BUSPAR) 10 MG tablet Take 1 tablet by  mouth twice daily 60 tablet 0   fluticasone (FLONASE) 50 MCG/ACT nasal spray Place 2 sprays into both nostrils daily. 16 g 0   hydrOXYzine (VISTARIL) 25 MG capsule Take 1-2 capsules (25-50 mg total) by mouth as directed. Take for severe panic attacks 60 capsule 1   LARIN 24 FE 1-20 MG-MCG(24) tablet Take 1 tablet by mouth daily.     Oxcarbazepine (TRILEPTAL) 300 MG tablet TAKE 3 TABLETS BY MOUTH TWICE DAILY (AM  AND  PM) 540 tablet 0   risperiDONE (RISPERDAL) 0.5 MG tablet TAKE 1 & 1/2 (ONE & ONE-HALF) TABLETS BY MOUTH AT BEDTIME 45 tablet 0   No current facility-administered medications for this visit.     Musculoskeletal: Strength & Muscle Tone:  UTA Gait & Station:  Seated Patient leans: N/A  Psychiatric Specialty Exam: Review of Systems  Psychiatric/Behavioral:  Positive for sleep disturbance. The patient is nervous/anxious.   All other systems reviewed and are negative.  There were no vitals taken for this visit.There is no height or weight on file to calculate BMI.  General Appearance: Casual  Eye Contact:  Fair  Speech:  Normal Rate  Volume:  Normal  Mood:  Anxious  Affect:  Congruent  Thought Process:  Goal Directed and Descriptions of Associations: Intact  Orientation:  Full (Time, Place, and Person)  Thought Content: Logical   Suicidal Thoughts:  No  Homicidal Thoughts:  No  Memory:  Immediate;   Fair Recent;   Fair Remote;   Fair  Judgement:  Fair  Insight:  Fair  Psychomotor Activity:  Normal  Concentration:  Concentration: Fair and Attention Span: Fair  Recall:  [employer] of Knowledge: Fair  Language: Fair  Akathisia:  No  Handed:  Right  AIMS (if indicated): done  Assets:  Communication Skills Desire for Improvement Housing Social Support  ADL's:  Intact  Cognition: WNL  Sleep:  Poor   Screenings: GAD-7    Flowsheet Row Video Visit from 03/29/2020 in [facility] Associates Counselor from 08/12/2019 in [facility]  Associates Office Visit from 07/08/2018 in [facility]  Total GAD-7 Score 18 18 8       PHQ2-9    Flowsheet Row Video Visit from 07/14/2020 in [facility] Associates Counselor from 05/04/2020 in [facility] Counselor from 08/12/2019 in [facility] Associates Office Visit from 07/08/2018 in [facility]  PHQ-2 Total Score 0 0 2 3  PHQ-9 Total Score -- -- 13 15      Flowsheet Row ED from 10/06/2020 in [facility] at [facility]  Video Visit from 07/14/2020 in [facility] Counselor from 05/04/2020 in [facility]  C-SSRS RISK CATEGORY No Risk No Risk No Risk        Assessment and Plan: Jill Sandoval is a 30 year old Caucasian female, married, lives in [place], has a history of bipolar disorder type II, panic attacks was evaluated by telemedicine today.  Patient continues to struggle with sleep.  Discussed plan as noted below.  Plan Bipolar disorder type II in remission Risperidone 0.75 mg p.o. daily Trileptal 900 mg p.o. twice daily AIMS - 0  Panic attacks-stable BuSpar 10 mg p.o. twice daily Hydroxyzine 25 to 50 mg p.o. daily as needed for severe panic attacks  Insomnia-unstable Increase Pamelor to 75 mg p.o. nightly Continue sleep hygiene techniques, wearing comfortable clothing, setting the thermostat right, avoiding caffeine, avoiding high amount of fluids at bedtime , switching off devices at least couple of hours before bedtime, managing pain. Patient could take melatonin 3 to 5 mg at bedtime as needed She could also use hydroxyzine 25 to 50 mg at bedtime as needed for sleep  High risk medication use-patient has been noncompliant with Trileptal level-provided education.  Encouraged compliance.  She agrees to get it done.  Follow-up in clinic in 3 weeks or sooner if needed.  This note was generated in part or  whole with voice recognition software. Voice recognition is usually quite accurate but there are transcription errors that can and very often do occur. I apologize for any typographical errors that were not detected and corrected.      Rossano Stazione, MD 11/10/2020, 9:56  AM

## 2020-11-29 ENCOUNTER — Other Ambulatory Visit: Payer: Self-pay

## 2020-11-29 ENCOUNTER — Encounter: Payer: Self-pay | Admitting: Psychiatry

## 2020-11-29 ENCOUNTER — Telehealth (INDEPENDENT_AMBULATORY_CARE_PROVIDER_SITE_OTHER): Payer: BC Managed Care – PPO | Admitting: Psychiatry

## 2020-11-29 DIAGNOSIS — Z91199 Patient's noncompliance with other medical treatment and regimen due to unspecified reason: Secondary | ICD-10-CM | POA: Diagnosis not present

## 2020-11-29 DIAGNOSIS — F410 Panic disorder [episodic paroxysmal anxiety] without agoraphobia: Secondary | ICD-10-CM | POA: Diagnosis not present

## 2020-11-29 DIAGNOSIS — G4701 Insomnia due to medical condition: Secondary | ICD-10-CM

## 2020-11-29 DIAGNOSIS — F3176 Bipolar disorder, in full remission, most recent episode depressed: Secondary | ICD-10-CM | POA: Diagnosis not present

## 2020-11-29 MED ORDER — BUSPIRONE HCL 10 MG PO TABS: 10.0000 mg | ORAL_TABLET | Freq: Two times a day (BID) | ORAL | 1 refills | Status: DC

## 2020-11-29 MED ORDER — RISPERIDONE 0.5 MG PO TABS: 0.7500 mg | ORAL_TABLET | Freq: Every day | ORAL | 1 refills | Status: DC

## 2020-11-29 NOTE — Progress Notes (Signed)
Virtual Visit via Video Note  I connected with Jill Sandoval on 11/29/20 at  8:30 AM EDT by a video enabled telemedicine application and verified that I am speaking with the correct person using two identifiers.  Location Provider Location : [facility] Patient Location : Home  Participants: Patient , Provider    I discussed the limitations of evaluation and management by telemedicine and the availability of in person appointments. The patient expressed understanding and agreed to proceed.   I discussed the assessment and treatment plan with the patient. The patient was provided an opportunity to ask questions and all were answered. The patient agreed with the plan and demonstrated an understanding of the instructions.   The patient was advised to call back or seek an in-person evaluation if the symptoms worsen or if the condition fails to improve as anticipated.   BH MD OP Progress Note  11/29/2020 8:52 AM MONTI VILLERS  MRN:  [id number]  Chief Complaint:  Chief Complaint   Follow-up; Anxiety; Insomnia    HPI: Jill Sandoval is a 30 year old Caucasian female, married, currently lives in [place], has a history of bipolar disorder type II was evaluated by telemedicine today.  Patient reports since being on the higher dosage of Pamelor she has been sleeping better.  She sleeps around 7 hours on average.  Her anxiety symptoms are more under control.  Patient reports she has not had any side effects to the higher dosage of Pamelor.  Reports she is compliant on all her other medications and denies side effects.  Patient has been noncompliant with Trileptal level, agrees to get it done again after discussion.  Denies suicidality, homicidality or perceptual disturbances.  Patient reports overall she is doing well today, denies any other concerns.    Visit Diagnosis:    ICD-10-CM   1. Bipolar disorder, in full remission, most recent episode depressed (HCC)  F31.76 risperiDONE  (RISPERDAL) 0.5 MG tablet    2. Panic disorder  F41.0 busPIRone (BUSPAR) 10 MG tablet    3. Insomnia due to medical condition  G47.01    anxiety    4. Noncompliance with treatment plan  Z91.199       Past Psychiatric History: Reviewed past psychiatric history from progress note on 10/15/2018.  Past trials of Celexa, Trileptal  Past Medical History:  Past Medical History:  Diagnosis Date   Anxiety and depression    Asthma    inhaler prn   Fetal demise > 22 weeks, delivered, current hospitalization 11/04/2015   Miscarriage     Past Surgical History:  Procedure Laterality Date   CESAREAN SECTION N/A 03/24/2012   Procedure: CESAREAN SECTION;  Surgeon: Mickel Baas, MD;  Location: [facility];  Service: Obstetrics;  Laterality: N/A;   CESAREAN SECTION N/A 09/24/2016   Procedure: CESAREAN SECTION;  Surgeon: Waynard Reeds, MD;  Location: [facility];  Service: Obstetrics;  Laterality: N/A;    Family Psychiatric History: Reviewed family psychiatric history from progress note on 10/15/2018  Family History:  Family History  Problem Relation Age of Onset   Hypertension Maternal Grandmother    Prostate cancer Maternal Grandfather    Hearing loss Brother    Hearing loss Paternal Grandfather    Hypertension Father    Other Neg Hx     Social History: Reviewed social history from progress note on 10/15/2018 Social History   Socioeconomic History   Marital status: Married    Spouse name: Not on file   Number of children:  2   Years of education: Not on file   Highest education level: Not on file  Occupational History   Not on file  Tobacco Use   Smoking status: Former    Packs/day: 0.25    Types: Cigarettes    Quit date: 11/14/2018    Years since quitting: 2.0   Smokeless tobacco: Never  Vaping Use   Vaping Use: Some days  Substance and Sexual Activity   Alcohol use: No   Drug use: No   Sexual activity: Yes    Birth control/protection: None  Other Topics Concern    Not on file  Social History Narrative   Married.   2 children.   Works at [employer].   Enjoys camping, being outdoors.    Social Determinants of Health   Financial Resource Strain: Not on file  Food Insecurity: Not on file  Transportation Needs: Not on file  Physical Activity: Not on file  Stress: Not on file  Social Connections: Not on file    Allergies: No Known Allergies  Metabolic Disorder Labs: Lab Results  Component Value Date   HGBA1C 4.8 09/15/2020   MPG 91 09/15/2020   No results found for: PROLACTIN Lab Results  Component Value Date   CHOL 180 09/15/2020   TRIG 122 09/15/2020   HDL 53 09/15/2020   CHOLHDL 3.4 09/15/2020   VLDL 20 06/14/2020   LDLCALC 105 (H) 09/15/2020   LDLCALC 151 (H) 06/14/2020   Lab Results  Component Value Date   TSH 1.34 04/27/2020   TSH 1.42 07/08/2019    Therapeutic Level Labs: No results found for: LITHIUM No results found for: VALPROATE No components found for:  CBMZ  Current Medications: Current Outpatient Medications  Medication Sig Dispense Refill   atorvastatin (LIPITOR) 10 MG tablet Take 1 tablet by mouth once daily 90 tablet 3   benzonatate (TESSALON) 200 MG capsule Take 1 capsule (200 mg total) by mouth 3 (three) times daily as needed for cough. 30 capsule 0   busPIRone (BUSPAR) 10 MG tablet Take 1 tablet (10 mg total) by mouth 2 (two) times daily. 60 tablet 1   fluticasone (FLONASE) 50 MCG/ACT nasal spray Place 2 sprays into both nostrils daily. 16 g 0   hydrOXYzine (VISTARIL) 25 MG capsule Take 1-2 capsules (25-50 mg total) by mouth as directed. Take for severe panic attacks 60 capsule 1   LARIN 24 FE 1-20 MG-MCG(24) tablet Take 1 tablet by mouth daily.     nortriptyline (PAMELOR) 25 MG capsule Take 3 capsules (75 mg total) by mouth at bedtime. 90 capsule 1   Oxcarbazepine (TRILEPTAL) 300 MG tablet TAKE 3 TABLETS BY MOUTH TWICE DAILY (AM  AND  PM) 540 tablet 0   risperiDONE (RISPERDAL) 0.5 MG tablet Take 1.5 tablets  (0.75 mg total) by mouth at bedtime. 45 tablet 1   No current facility-administered medications for this visit.     Musculoskeletal: Strength & Muscle Tone:  UTA Gait & Station:  Seated Patient leans: N/A  Psychiatric Specialty Exam: Review of Systems  Psychiatric/Behavioral:  The patient is nervous/anxious.   All other systems reviewed and are negative.  There were no vitals taken for this visit.There is no height or weight on file to calculate BMI.  General Appearance: Casual  Eye Contact:  Fair  Speech:  Clear and Coherent  Volume:  Normal  Mood:  Anxious improving  Affect:  Congruent  Thought Process:  Goal Directed and Descriptions of Associations: Intact  Orientation:  Full (  Time, Place, and Person)  Thought Content: Logical   Suicidal Thoughts:  No  Homicidal Thoughts:  No  Memory:  Immediate;   Fair Recent;   Fair Remote;   Fair  Judgement:  Fair  Insight:  Fair  Psychomotor Activity:  Normal  Concentration:  Concentration: Fair and Attention Span: Fair  Recall:  [employer] of Knowledge: Fair  Language: Fair  Akathisia:  No  Handed:  Right  AIMS (if indicated): done, 0  Assets:  Communication Skills Desire for Improvement Housing Social Support  ADL's:  Intact  Cognition: WNL  Sleep:   improving   Screenings: GAD-7    Flowsheet Row Video Visit from 03/29/2020 in [facility] Counselor from 08/12/2019 in [facility] Office Visit from 07/08/2018 in [facility]  Total GAD-7 Score 18 18 8       PHQ2-9    Flowsheet Row Video Visit from 07/14/2020 in [facility] Counselor from 05/04/2020 in [facility] Counselor from 08/12/2019 in [facility] Office Visit from 07/08/2018 in [facility]  PHQ-2 Total Score 0 0 2 3  PHQ-9 Total Score -- -- 13 15      Flowsheet Row ED from  10/06/2020 in [facility]  Video Visit from 07/14/2020 in [facility] Counselor from 05/04/2020 in [facility]  C-SSRS RISK CATEGORY No Risk No Risk No Risk        Assessment and Plan: Jill Sandoval is a 30 year old Caucasian female, married, lives in [place], has a history of bipolar disorder type II, panic attacks was evaluated by telemedicine today.  Patient is currently improving on the current medication regimen and increased dosage of Pamelor.  Plan as noted below.   Bipolar disorder type II in remission Risperidone 0.75 mg p.o. daily Trileptal 900 mg p.o. twice daily  Panic attacks-stable BuSpar 10 mg p.o. twice daily Hydroxyzine 25-50 mg p.o. daily as needed for severe panic attacks  Insomnia-improving Pamelor 75 mg p.o. daily Melatonin 3 to 5 mg at bedtime as needed Hydroxyzine 25 to 50 mg at bedtime as needed for sleep Continue sleep hygiene techniques  Noncompliance with treatment plan-patient has been noncompliant with Trileptal level.  Encouraged compliance.  She agreed to get it done.  Follow-up in clinic in person in 2 months or sooner if needed.   This note was generated in part or whole with voice recognition software. Voice recognition is usually quite accurate but there are transcription errors that can and very often do occur. I apologize for any typographical errors that were not detected and corrected.       Rossano Stazione, MD 11/29/2020, 8:52 AM

## 2020-12-14 ENCOUNTER — Other Ambulatory Visit: Payer: Self-pay | Admitting: Psychiatry

## 2020-12-14 DIAGNOSIS — F410 Panic disorder [episodic paroxysmal anxiety] without agoraphobia: Secondary | ICD-10-CM

## 2021-01-23 ENCOUNTER — Other Ambulatory Visit: Payer: Self-pay | Admitting: Psychiatry

## 2021-01-23 DIAGNOSIS — F3176 Bipolar disorder, in full remission, most recent episode depressed: Secondary | ICD-10-CM

## 2021-02-02 ENCOUNTER — Telehealth: Payer: BC Managed Care – PPO | Admitting: Psychiatry

## 2021-02-17 ENCOUNTER — Encounter: Payer: Self-pay | Admitting: Psychiatry

## 2021-02-17 ENCOUNTER — Telehealth (INDEPENDENT_AMBULATORY_CARE_PROVIDER_SITE_OTHER): Payer: BC Managed Care – PPO | Admitting: Psychiatry

## 2021-02-17 ENCOUNTER — Other Ambulatory Visit: Payer: Self-pay

## 2021-02-17 DIAGNOSIS — F3176 Bipolar disorder, in full remission, most recent episode depressed: Secondary | ICD-10-CM

## 2021-02-17 DIAGNOSIS — Z91199 Patient's noncompliance with other medical treatment and regimen due to unspecified reason: Secondary | ICD-10-CM

## 2021-02-17 DIAGNOSIS — F410 Panic disorder [episodic paroxysmal anxiety] without agoraphobia: Secondary | ICD-10-CM

## 2021-02-17 DIAGNOSIS — G4701 Insomnia due to medical condition: Secondary | ICD-10-CM

## 2021-02-17 DIAGNOSIS — Z79899 Other long term (current) drug therapy: Secondary | ICD-10-CM

## 2021-02-17 MED ORDER — RISPERIDONE 0.5 MG PO TABS: 0.7500 mg | ORAL_TABLET | Freq: Every day | ORAL | 2 refills | Status: DC

## 2021-02-17 MED ORDER — BUSPIRONE HCL 10 MG PO TABS: 10.0000 mg | ORAL_TABLET | Freq: Two times a day (BID) | ORAL | 2 refills | Status: DC

## 2021-02-17 NOTE — Progress Notes (Signed)
Virtual Visit via Video Note  I connected with Jill Sandoval on 02/17/21 at  8:30 AM EST by a video enabled telemedicine application and verified that I am speaking with the correct person using two identifiers.  Location Provider Location : [facility] Patient Location : Home  Participants: Patient , Provider    I discussed the limitations of evaluation and management by telemedicine and the availability of in person appointments. The patient expressed understanding and agreed to proceed.   I discussed the assessment and treatment plan with the patient. The patient was provided an opportunity to ask questions and all were answered. The patient agreed with the plan and demonstrated an understanding of the instructions.   The patient was advised to call back or seek an in-person evaluation if the symptoms worsen or if the condition fails to improve as anticipated.    [place] MD OP Progress Note  02/17/2021 1:15 PM Jill Sandoval  MRN:  [id number]  Chief Complaint:  Chief Complaint   Follow-up; 31 year old Caucasian female with history of bipolar disorder, panic disorder, insomnia, presents for a follow-up appointment for medication management.    HPI: Jill Sandoval is a 31 year old Caucasian female, married, currently lives in [place], has a history of bipolar disorder type II was evaluated by telemedicine today.  Patient was last evaluated on 11/29/2020.  Patient today returns reporting mood symptoms are stable.  Denies any mood swings.  Denies any depression.  Denies any anxiety.  Reports sleep is good.  Reports she has not had any significant panic attacks.  She is compliant on medications as prescribed.  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  She reports work is going well.  Patient has been unable to get her Trileptal level completed.  Patient responded when asked about labs by stating 'she just forgot'.  Patient encouraged to get it  done.  Agreeable.  Denies any suicidality, homicidality or perceptual disturbances.  Patient denies any other concerns today.  Visit Diagnosis:    ICD-10-CM   1. Bipolar disorder, in full remission, most recent episode depressed ([place])  F31.76 risperiDONE (RISPERDAL) 0.5 MG tablet    10-Hydroxycarbazepine    2. Panic disorder  F41.0 busPIRone (BUSPAR) 10 MG tablet    3. Insomnia due to medical condition  G47.01    Anxiety    4. Noncompliance with treatment plan  Z91.199     5. High risk medication use  Z79.899 10-Hydroxycarbazepine      Past Psychiatric History: Reviewed past psychiatric history from progress note on 10/15/2018.  Past trials of Celexa, Trileptal.  Past Medical History:  Past Medical History:  Diagnosis Date   Anxiety and depression    Asthma    inhaler prn   Fetal demise > 22 weeks, delivered, current hospitalization 11/04/2015   Miscarriage     Past Surgical History:  Procedure Laterality Date   CESAREAN SECTION N/A 03/24/2012   Procedure: CESAREAN SECTION;  Surgeon: Sharene Butters, MD;  Location: [facility];  Service: Obstetrics;  Laterality: N/A;   CESAREAN SECTION N/A 09/24/2016   Procedure: CESAREAN SECTION;  Surgeon: Vanessa Kick, MD;  Location: [facility];  Service: Obstetrics;  Laterality: N/A;    Family Psychiatric History: Reviewed family psychiatric history from progress note on 10/15/2018.  Family History:  Family History  Problem Relation Age of Onset   Hypertension Maternal Grandmother    Prostate cancer Maternal Grandfather    Hearing loss Brother    Occupational psychologist  loss Paternal Grandfather    Hypertension Father    Other Neg Hx     Social History: Reviewed social history from progress note on 10/15/2018. Social History   Socioeconomic History   Marital status: Married    Spouse name: Not on file   Number of children: 2   Years of education: Not on file   Highest education level: Not on file  Occupational History   Not on file   Tobacco Use   Smoking status: Former    Packs/day: 0.25    Types: Cigarettes    Quit date: 11/14/2018    Years since quitting: 2.2   Smokeless tobacco: Never  Vaping Use   Vaping Use: Some days  Substance and Sexual Activity   Alcohol use: No   Drug use: No   Sexual activity: Yes    Birth control/protection: None  Other Topics Concern   Not on file  Social History Narrative   Married.   2 children.   Works at [employer].   Enjoys camping, being outdoors.    Social Determinants of Health   Financial Resource Strain: Not on file  Food Insecurity: Not on file  Transportation Needs: Not on file  Physical Activity: Not on file  Stress: Not on file  Social Connections: Not on file    Allergies: No Known Allergies  Metabolic Disorder Labs: Lab Results  Component Value Date   HGBA1C 4.8 09/15/2020   MPG 91 09/15/2020   No results found for: PROLACTIN Lab Results  Component Value Date   CHOL 180 09/15/2020   TRIG 122 09/15/2020   HDL 53 09/15/2020   CHOLHDL 3.4 09/15/2020   VLDL 20 06/14/2020   LDLCALC 105 (H) 09/15/2020   LDLCALC 151 (H) 06/14/2020   Lab Results  Component Value Date   TSH 1.34 04/27/2020   TSH 1.42 07/08/2019    Therapeutic Level Labs: No results found for: LITHIUM No results found for: VALPROATE No components found for:  CBMZ  Current Medications: Current Outpatient Medications  Medication Sig Dispense Refill   atorvastatin (LIPITOR) 10 MG tablet Take 1 tablet by mouth once daily 90 tablet 3   benzonatate (TESSALON) 200 MG capsule Take 1 capsule (200 mg total) by mouth 3 (three) times daily as needed for cough. 30 capsule 0   busPIRone (BUSPAR) 10 MG tablet Take 1 tablet (10 mg total) by mouth 2 (two) times daily. 60 tablet 2   fluticasone (FLONASE) 50 MCG/ACT nasal spray Place 2 sprays into both nostrils daily. 16 g 0   hydrOXYzine (VISTARIL) 25 MG capsule Take 1-2 capsules (25-50 mg total) by mouth as directed. Take for severe panic  attacks 60 capsule 1   LARIN 24 FE 1-20 MG-MCG(24) tablet Take 1 tablet by mouth daily.     nortriptyline (PAMELOR) 25 MG capsule TAKE 3 CAPSULES BY MOUTH AT BEDTIME 90 capsule 0   Oxcarbazepine (TRILEPTAL) 300 MG tablet TAKE 3 TABLETS BY MOUTH TWICE DAILY IN THE MORNING AND IN THE EVENING 540 tablet 0   risperiDONE (RISPERDAL) 0.5 MG tablet Take 1.5 tablets (0.75 mg total) by mouth at bedtime. 45 tablet 2   No current facility-administered medications for this visit.     Musculoskeletal: Strength & Muscle Tone:  UTA Gait & Station:  Seated Patient leans: N/A  Psychiatric Specialty Exam: Review of Systems  Psychiatric/Behavioral:  Negative for agitation, behavioral problems, confusion, decreased concentration, dysphoric mood, hallucinations, self-injury, sleep disturbance and suicidal ideas. The patient is not nervous/anxious and is  not hyperactive.   All other systems reviewed and are negative.  There were no vitals taken for this visit.There is no height or weight on file to calculate BMI.  General Appearance: Casual  Eye Contact:  Fair  Speech:  Clear and Coherent  Volume:  Normal  Mood:  Euthymic  Affect:  Congruent  Thought Process:  Goal Directed and Descriptions of Associations: Intact  Orientation:  Full (Time, Place, and Person)  Thought Content: Logical   Suicidal Thoughts:  No  Homicidal Thoughts:  No  Memory:  Immediate;   Fair Recent;   Fair Remote;   Fair  Judgement:  Fair  Insight:  Fair  Psychomotor Activity:  Normal  Concentration:  Concentration: Fair and Attention Span: Fair  Recall:  [employer] of Knowledge: Fair  Language: Fair  Akathisia:  No  Handed:  Right  AIMS (if indicated):  done, 0  Assets:  Communication Skills Desire for Improvement Social Support  ADL's:  Intact  Cognition: WNL  Sleep:  Fair   Screenings: AIMS    Flowsheet Row Video Visit from 02/17/2021 in [place] Total Score 0      GAD-7     Flowsheet Row Video Visit from 02/17/2021 in [place] Video Visit from 03/29/2020 in [place] from 08/12/2019 in [place] from 07/08/2018 in [place] at [facility]  Total GAD-7 Score 0 18 18 8       PHQ2-9    [place] Video Visit from 02/17/2021 in [place] Video Visit from 07/14/2020 in [place] from 05/04/2020 in [place] from 08/12/2019 in [place] from 07/08/2018 in [place] at [place]  PHQ-2 Total Score 0 0 0 2 3  PHQ-9 Total Score -- -- -- [address] [place] ED from 10/06/2020 in [facility] [place] from 07/14/2020 in [place] Counselor from 05/04/2020 in [place] No Risk No Risk No Risk        Assessment and Plan: Jill Sandoval is a 31 year old Caucasian female, married, lives in [place], has a history of bipolar disorder type II, panic attacks was evaluated by telemedicine today.  Patient is currently stable however has been noncompliant with labs.  Discussed plan as noted below.  Plan Bipolar disorder type II in remission Risperidone 0.75 mg p.o. daily Trileptal 900 mg p.o. twice daily.  Panic disorder-stable BuSpar 10 mg p.o. twice daily Hydroxyzine 25-50 mg p.o. daily as needed for severe panic attacks  Insomnia-stable Pamelor 75 mg p.o. daily Melatonin 3 to 5 mg at bedtime as needed Hydroxyzine 25-50 mg at bedtime as needed for sleep Continue sleep hygiene techniques  Noncompliant with treatment plan-patient noncompliant with Trileptal level-will order labs again.  Patient advised to get it done.  Follow-up in clinic in 2 to 3 months or sooner if  needed.  This note was generated in part or whole with voice recognition software. Voice recognition is usually quite accurate but there are transcription errors that can and very often do occur. I apologize for any typographical errors that were not detected and corrected.     Ursula Alert, MD 02/17/2021, 1:15 PM

## 2021-03-02 ENCOUNTER — Other Ambulatory Visit: Payer: Self-pay | Admitting: Psychiatry

## 2021-03-02 DIAGNOSIS — F3176 Bipolar disorder, in full remission, most recent episode depressed: Secondary | ICD-10-CM

## 2021-03-08 ENCOUNTER — Encounter: Payer: Self-pay | Admitting: Internal Medicine

## 2021-03-08 ENCOUNTER — Telehealth (INDEPENDENT_AMBULATORY_CARE_PROVIDER_SITE_OTHER): Payer: BC Managed Care – PPO | Admitting: Internal Medicine

## 2021-03-08 DIAGNOSIS — J069 Acute upper respiratory infection, unspecified: Secondary | ICD-10-CM | POA: Diagnosis not present

## 2021-03-08 MED ORDER — AZITHROMYCIN 250 MG PO TABS: ORAL_TABLET | ORAL | 0 refills | Status: DC

## 2021-03-08 MED ORDER — HYDROCODONE BIT-HOMATROP MBR 5-1.5 MG/5ML PO SOLN: 5.0000 mL | Freq: Three times a day (TID) | ORAL | 0 refills | Status: DC | PRN

## 2021-03-08 NOTE — Progress Notes (Signed)
Virtual Visit via Video Note  I connected with Jill Sandoval on 03/08/21 at 11:20 AM EST by a video enabled telemedicine application and verified that I am speaking with the correct person using two identifiers.  Location: Patient: Home Provider: Office  Person's participating in this video call: Nicki Reaper, NP and Cheryl Flash.   I discussed the limitations of evaluation and management by telemedicine and the availability of in person appointments. The patient expressed understanding and agreed to proceed.  History of Present Illness:  Pt reports headache, nasal congestion, sore throat and cough. This started 5 days ago.  The headache is located in her forehead.  She describes the pain as pressure.  This has improved.  She is not blowing anything out of her nose. She denies difficulty swallowing. The cough is non productive.  She denies runny nose, ear pain, shortness of breath, nausea, vomiting or diarrhea.  She has had fever, chills and body aches.  She has tried Alka-Seltzer daytime and nighttime to severe cold with minimal relief of symptoms.  She has not had sick contacts, exposure to COVID or flu that she is aware of.  She has had a negative COVID test at home yesterday.   Past Medical History:  Diagnosis Date   Anxiety and depression    Asthma    inhaler prn   Fetal demise > 22 weeks, delivered, current hospitalization 11/04/2015   Miscarriage     Current Outpatient Medications  Medication Sig Dispense Refill   atorvastatin (LIPITOR) 10 MG tablet Take 1 tablet by mouth once daily 90 tablet 3   benzonatate (TESSALON) 200 MG capsule Take 1 capsule (200 mg total) by mouth 3 (three) times daily as needed for cough. 30 capsule 0   busPIRone (BUSPAR) 10 MG tablet Take 1 tablet (10 mg total) by mouth 2 (two) times daily. 60 tablet 2   fluticasone (FLONASE) 50 MCG/ACT nasal spray Place 2 sprays into both nostrils daily. 16 g 0   hydrOXYzine (VISTARIL) 25 MG capsule Take 1-2  capsules (25-50 mg total) by mouth as directed. Take for severe panic attacks 60 capsule 1   LARIN 24 FE 1-20 MG-MCG(24) tablet Take 1 tablet by mouth daily.     nortriptyline (PAMELOR) 25 MG capsule TAKE 3 CAPSULES BY MOUTH AT BEDTIME 90 capsule 2   Oxcarbazepine (TRILEPTAL) 300 MG tablet TAKE 3 TABLETS BY MOUTH TWICE DAILY IN THE MORNING AND IN THE EVENING 540 tablet 0   risperiDONE (RISPERDAL) 0.5 MG tablet Take 1.5 tablets (0.75 mg total) by mouth at bedtime. 45 tablet 2   No current facility-administered medications for this visit.    No Known Allergies  Family History  Problem Relation Age of Onset   Hypertension Maternal Grandmother    Prostate cancer Maternal Grandfather    Hearing loss Brother    Hearing loss Paternal Grandfather    Hypertension Father    Other Neg Hx     Social History   Socioeconomic History   Marital status: Married    Spouse name: Not on file   Number of children: 2   Years of education: Not on file   Highest education level: Not on file  Occupational History   Not on file  Tobacco Use   Smoking status: Former    Packs/day: 0.25    Types: Cigarettes    Quit date: 11/14/2018    Years since quitting: 2.3   Smokeless tobacco: Never  Vaping Use   Vaping Use: Some days  Substance and Sexual Activity   Alcohol use: No   Drug use: No   Sexual activity: Yes    Birth control/protection: None  Other Topics Concern   Not on file  Social History Narrative   Married.   2 children.   Works at [employer].   Enjoys camping, being outdoors.    Social Determinants of Health   Financial Resource Strain: Not on file  Food Insecurity: Not on file  Transportation Needs: Not on file  Physical Activity: Not on file  Stress: Not on file  Social Connections: Not on file  Intimate Partner Violence: Not on file     Constitutional: Patient reports headache, fever, chills and body aches.  Denies abrupt weight changes.  HEENT: Pt reports nasal congestion,  sore throat. Denies eye pain, eye redness, ear pain, ringing in the ears, wax buildup, runny nose, bloody nose. Respiratory: Pt reports cough. Denies difficulty breathing, shortness of breathor sputum production.   Cardiovascular: Denies chest pain, chest tightness, palpitations or swelling in the hands or feet.  Gastrointestinal: Denies abdominal pain, bloating, constipation, diarrhea or blood in the stool.   No other specific complaints in a complete review of systems (except as listed in HPI above).  Observations/Objective:   Wt Readings from Last 3 Encounters:  09/15/20 257 lb 3.2 oz (116.7 kg)  04/27/20 246 lb (111.6 kg)  08/27/19 (!) 251 lb (113.9 kg)    General: Appears her stated age, appears unwell but in NAD. HEENT: ; Nose: Congestion noted; Throat/Mouth: Hoarseness noted Pulmonary/Chest: Normal effort . No respiratory distress.  Neurological: Alert and oriented.   BMET    Component Value Date/Time   NA 139 09/15/2020 0955   NA 136 06/30/2015 0000   NA 137 07/28/2011 1558   K 4.4 09/15/2020 0955   K 3.7 07/28/2011 1558   CL 104 09/15/2020 0955   CL 107 07/28/2011 1558   CO2 26 09/15/2020 0955   CO2 22 07/28/2011 1558   GLUCOSE 80 09/15/2020 0955   GLUCOSE 76 07/28/2011 1558   BUN 14 09/15/2020 0955   BUN 9 06/30/2015 0000   BUN 11 07/28/2011 1558   CREATININE 0.73 09/15/2020 0955   CALCIUM 9.6 09/15/2020 0955   CALCIUM 9.1 07/28/2011 1558   GFRNONAA >60 06/09/2020 0946   GFRNONAA >60 07/28/2011 1558   GFRAA 136 06/30/2015 0000   GFRAA >60 07/28/2011 1558    Lipid Panel     Component Value Date/Time   CHOL 180 09/15/2020 0955   CHOL 194 06/30/2015 0000   TRIG 122 09/15/2020 0955   HDL 53 09/15/2020 0955   HDL 57 06/30/2015 0000   CHOLHDL 3.4 09/15/2020 0955   VLDL 20 06/14/2020 0927   LDLCALC 105 (H) 09/15/2020 0955    CBC    Component Value Date/Time   WBC 8.1 09/15/2020 0955   RBC 4.53 09/15/2020 0955   HGB 12.9 09/15/2020 0955   HGB 12.6  06/30/2015 0000   HCT 39.3 09/15/2020 0955   HCT 38.1 06/30/2015 0000   PLT 286 09/15/2020 0955   PLT 320 06/30/2015 0000   MCV 86.8 09/15/2020 0955   MCV 84 06/30/2015 0000   MCV 89 07/28/2011 1558   MCH 28.5 09/15/2020 0955   MCHC 32.8 09/15/2020 0955   RDW 12.8 09/15/2020 0955   RDW 13.9 06/30/2015 0000   RDW 13.3 07/28/2011 1558   LYMPHSABS 2.2 06/09/2020 0946   LYMPHSABS 2.4 06/30/2015 0000   MONOABS 0.4 06/09/2020 0946   EOSABS 0.3 06/09/2020  0946   EOSABS 0.2 06/30/2015 0000   BASOSABS 0.0 06/09/2020 0946   BASOSABS 0.0 06/30/2015 0000    Hgb A1C Lab Results  Component Value Date   HGBA1C 4.8 09/15/2020        Assessment and Plan:  URI with Cough:  I initially wanted to treat with prednisone but she does not do well with prednisone due to side effects Rx for Azithromycin 250 mg x 5 days Rx for Hycodan as needed for cough-sedation caution given Encourage rest and fluids Start Flonase OTC Work note provided  Return precautions discussed Follow Up Instructions:    I discussed the assessment and treatment plan with the patient. The patient was provided an opportunity to ask questions and all were answered. The patient agreed with the plan and demonstrated an understanding of the instructions.   The patient was advised to call back or seek an in-person evaluation if the symptoms worsen or if the condition fails to improve as anticipated.   Nicki Reaper, NP

## 2021-03-08 NOTE — Patient Instructions (Signed)

## 2021-04-03 ENCOUNTER — Other Ambulatory Visit
Admission: RE | Admit: 2021-04-03 | Discharge: 2021-04-03 | Disposition: A | Discharge status: Home / Self Care | Payer: BC Managed Care – PPO | Source: Ambulatory Visit | Attending: Psychiatry | Admitting: Psychiatry

## 2021-04-03 ENCOUNTER — Other Ambulatory Visit: Payer: Self-pay | Admitting: Psychiatry

## 2021-04-03 DIAGNOSIS — Z79899 Other long term (current) drug therapy: Secondary | ICD-10-CM | POA: Diagnosis not present

## 2021-04-03 DIAGNOSIS — F3176 Bipolar disorder, in full remission, most recent episode depressed: Secondary | ICD-10-CM | POA: Insufficient documentation

## 2021-04-03 DIAGNOSIS — F410 Panic disorder [episodic paroxysmal anxiety] without agoraphobia: Secondary | ICD-10-CM

## 2021-04-04 LAB — MISC LABCORP TEST (SEND OUT): Labcorp test code: 716928

## 2021-05-17 ENCOUNTER — Other Ambulatory Visit: Payer: Self-pay | Admitting: Psychiatry

## 2021-05-17 DIAGNOSIS — F3176 Bipolar disorder, in full remission, most recent episode depressed: Secondary | ICD-10-CM

## 2021-05-19 ENCOUNTER — Encounter: Payer: Self-pay | Admitting: Psychiatry

## 2021-05-19 ENCOUNTER — Telehealth (INDEPENDENT_AMBULATORY_CARE_PROVIDER_SITE_OTHER): Payer: BC Managed Care – PPO | Admitting: Psychiatry

## 2021-05-19 DIAGNOSIS — Z79899 Other long term (current) drug therapy: Secondary | ICD-10-CM

## 2021-05-19 DIAGNOSIS — G4701 Insomnia due to medical condition: Secondary | ICD-10-CM

## 2021-05-19 DIAGNOSIS — F410 Panic disorder [episodic paroxysmal anxiety] without agoraphobia: Secondary | ICD-10-CM

## 2021-05-19 DIAGNOSIS — F3176 Bipolar disorder, in full remission, most recent episode depressed: Secondary | ICD-10-CM

## 2021-05-19 MED ORDER — BUSPIRONE HCL 10 MG PO TABS: 10.0000 mg | ORAL_TABLET | Freq: Two times a day (BID) | ORAL | 0 refills | Status: DC

## 2021-05-19 MED ORDER — RISPERIDONE 0.5 MG PO TABS: ORAL_TABLET | ORAL | 0 refills | Status: DC

## 2021-05-19 NOTE — Progress Notes (Signed)
Virtual Visit via Video Note ? ?I connected with Jill Sandoval on 05/19/21 at  8:30 AM EDT by a video enabled telemedicine application and verified that I am speaking with the correct person using two identifiers. ? ?Location ?Provider Location : [facility] ?Patient Location : Home ? ?Participants: Patient , Provider ? ?  ?I discussed the limitations of evaluation and management by telemedicine and the availability of in person appointments. The patient expressed understanding and agreed to proceed. ?  ?I discussed the assessment and treatment plan with the patient. The patient was provided an opportunity to ask questions and all were answered. The patient agreed with the plan and demonstrated an understanding of the instructions. ?  ?The patient was advised to call back or seek an in-person evaluation if the symptoms worsen or if the condition fails to improve as anticipated. ? ? ?[facility] MD OP Progress Note ? ?05/19/2021 8:53 AM ?Jill Sandoval  ?MRN:  [id number] ? ?Chief Complaint:  ?Chief Complaint  ?Patient presents with  ? Follow-up: 31 year old Caucasian female with history of bipolar disorder, panic disorder, insomnia, presented for medication management.  ? ?HPI: Jill Sandoval is a 31 year old Caucasian female, married, currently lives in [place], has a history of bipolar disorder type II was evaluated by telemedicine today. ? ?Patient today reports she is currently doing fairly well.  Denies any depression or manic symptoms.  Reports anxiety symptoms are under control.  The last time she had a panic attack was 2 months ago. ? ?Reports sleep continues to be good. ? ?She is compliant on medications. ? ?Currently taking a break from work since she needs to be with her daughter who is 67 years old and currently struggling with medical problems. ? ?Reviewed and discussed Trileptal level with patient-04/03/2021. ? ?Patient denies any suicidality, homicidality or perceptual disturbances. ? ?Patient denies any other  concerns today. ? ?Visit Diagnosis:  ?  ICD-10-CM   ?1. Bipolar disorder, in full remission, most recent episode depressed ([place])  F31.76 risperiDONE (RISPERDAL) 0.5 MG tablet  ?  ?2. Panic disorder  F41.0 busPIRone (BUSPAR) 10 MG tablet  ?  ?3. Insomnia due to medical condition  G47.01   ? anxiety  ?  ?4. High risk medication use  Z79.899   ?  ? ? ?Past Psychiatric History: Reviewed past psychiatric history from progress note on 10/15/2018.  Past trials of Celexa, Trileptal. ? ?Past Medical History:  ?Past Medical History:  ?Diagnosis Date  ? Anxiety and depression   ? Asthma   ? inhaler prn  ? Fetal demise > 22 weeks, delivered, current hospitalization 11/04/2015  ? Miscarriage   ?  ?Past Surgical History:  ?Procedure Laterality Date  ? CESAREAN SECTION N/A 03/24/2012  ? Procedure: CESAREAN SECTION;  Surgeon: Sharene Butters, MD;  Location: [facility];  Service: Obstetrics;  Laterality: N/A;  ? CESAREAN SECTION N/A 09/24/2016  ? Procedure: CESAREAN SECTION;  Surgeon: Vanessa Kick, MD;  Location: [facility];  Service: Obstetrics;  Laterality: N/A;  ? ? ?Family Psychiatric History: Reviewed family psychiatric history from progress note on 10/15/2018. ? ?Family History:  ?Family History  ?Problem Relation Age of Onset  ? Hypertension Maternal Grandmother   ? Prostate cancer Maternal Grandfather   ? Hearing loss Brother   ? Hearing loss Paternal Grandfather   ? Hypertension Father   ? Other Neg Hx   ? ? ?Social History: Reviewed social history from progress note on 10/15/2018. ?Social History  ? ?Socioeconomic History  ?  Marital status: Married  ?  Spouse name: Not on file  ? Number of children: 2  ? Years of education: Not on file  ? Highest education level: Not on file  ?Occupational History  ? Not on file  ?Tobacco Use  ? Smoking status: Former  ?  Packs/day: 0.25  ?  Types: Cigarettes  ?  Quit date: 11/14/2018  ?  Years since quitting: 2.5  ? Smokeless tobacco: Never  ?Vaping Use  ? Vaping Use: Some days  ?Substance  and Sexual Activity  ? Alcohol use: No  ? Drug use: No  ? Sexual activity: Yes  ?  Birth control/protection: None  ?Other Topics Concern  ? Not on file  ?Social History Narrative  ? Married.  ? 2 children.  ? Works at [employer].  ? Enjoys camping, being outdoors.   ? ?Social Determinants of Health  ? ?Financial Resource Strain: Not on file  ?Food Insecurity: Not on file  ?Transportation Needs: Not on file  ?Physical Activity: Not on file  ?Stress: Not on file  ?Social Connections: Not on file  ? ? ?Allergies: No Known Allergies ? ?Metabolic Disorder Labs: ?Lab Results  ?Component Value Date  ? HGBA1C 4.8 09/15/2020  ? MPG 91 09/15/2020  ? ?No results found for: PROLACTIN ?Lab Results  ?Component Value Date  ? CHOL 180 09/15/2020  ? TRIG 122 09/15/2020  ? HDL 53 09/15/2020  ? CHOLHDL 3.4 09/15/2020  ? VLDL 20 06/14/2020  ? LDLCALC 105 (H) 09/15/2020  ? LDLCALC 151 (H) 06/14/2020  ? ?Lab Results  ?Component Value Date  ? TSH 1.34 04/27/2020  ? TSH 1.42 07/08/2019  ? ? ?Therapeutic Level Labs: ?No results found for: LITHIUM ?No results found for: VALPROATE ?No components found for:  CBMZ ? ?Current Medications: ?Current Outpatient Medications  ?Medication Sig Dispense Refill  ? atorvastatin (LIPITOR) 10 MG tablet Take 1 tablet by mouth once daily 90 tablet 3  ? azithromycin (ZITHROMAX) 250 MG tablet Take 2 tabs today, then 1 tab daily x 4 days 6 tablet 0  ? busPIRone (BUSPAR) 10 MG tablet Take 1 tablet (10 mg total) by mouth 2 (two) times daily. 180 tablet 0  ? HYDROcodone bit-homatropine (HYCODAN) 5-1.5 MG/5ML syrup Take 5 mLs by mouth every 8 (eight) hours as needed for cough. 120 mL 0  ? hydrOXYzine (VISTARIL) 25 MG capsule Take 1-2 capsules (25-50 mg total) by mouth as directed. Take for severe panic attacks 60 capsule 1  ? LARIN 24 FE 1-20 MG-MCG(24) tablet Take 1 tablet by mouth daily.    ? nortriptyline (PAMELOR) 25 MG capsule TAKE 3 CAPSULES BY MOUTH AT BEDTIME 90 capsule 2  ? Oxcarbazepine (TRILEPTAL) 300 MG  tablet TAKE 3 TABLETS BY MOUTH TWICE DAILY (IN  THE  MORNING  AND  EVENING) 540 tablet 0  ? risperiDONE (RISPERDAL) 0.5 MG tablet TAKE 1 & 1/2 (ONE & ONE-HALF) TABLETS BY MOUTH AT BEDTIME 135 tablet 0  ? ?No current facility-administered medications for this visit.  ? ? ? ?Musculoskeletal: ?Strength & Muscle Tone:  UTA ?Gait & Station:  Seated ?Patient leans: N/A ? ?Psychiatric Specialty Exam: ?Review of Systems  ?Psychiatric/Behavioral:  Negative for agitation, behavioral problems, confusion, decreased concentration, dysphoric mood, hallucinations, self-injury, sleep disturbance and suicidal ideas. The patient is not nervous/anxious and is not hyperactive.   ?All other systems reviewed and are negative.  ?There were no vitals taken for this visit.There is no height or weight on file to calculate BMI.  ?  General Appearance: Casual  ?Eye Contact:  Fair  ?Speech:  Clear and Coherent  ?Volume:  Normal  ?Mood:  Euthymic  ?Affect:  Congruent  ?Thought Process:  Goal Directed and Descriptions of Associations: Intact  ?Orientation:  Full (Time, Place, and Person)  ?Thought Content: Logical   ?Suicidal Thoughts:  No  ?Homicidal Thoughts:  No  ?Memory:  Immediate;   Fair ?Recent;   Fair ?Remote;   Fair  ?Judgement:  Fair  ?Insight:  Fair  ?Psychomotor Activity:  Normal  ?Concentration:  Concentration: Fair and Attention Span: Fair  ?Recall:  Fair  ?Fund of Knowledge: Fair  ?Language: Fair  ?Akathisia:  No  ?Handed:  Right  ?AIMS (if indicated): done  ?Assets:  Communication Skills ?Desire for Improvement ?Housing ?Intimacy ?Social Support ?Transportation  ?ADL's:  Intact  ?Cognition: WNL  ?Sleep:  Fair  ? ?Screenings: ?AIMS   ? ?Flowsheet Row Video Visit from 05/19/2021 in [facility] Video Visit from 02/17/2021 in [facility]  ?AIMS Total Score 0 0  ? ?  ? ?GAD-7   ? ?Flowsheet Row Video Visit from 02/17/2021 in [facility] Video Visit from  03/29/2020 in [facility] from 08/12/2019 in [facility] Office Visit from 07/08/2018 in [facility]  ?Total GAD-7 Score 0

## 2021-06-03 ENCOUNTER — Encounter: Payer: Self-pay | Admitting: Internal Medicine

## 2021-06-13 ENCOUNTER — Other Ambulatory Visit: Payer: Self-pay | Admitting: Psychiatry

## 2021-06-13 DIAGNOSIS — F3176 Bipolar disorder, in full remission, most recent episode depressed: Secondary | ICD-10-CM

## 2021-07-05 ENCOUNTER — Other Ambulatory Visit: Payer: Self-pay | Admitting: Psychiatry

## 2021-07-05 DIAGNOSIS — F410 Panic disorder [episodic paroxysmal anxiety] without agoraphobia: Secondary | ICD-10-CM

## 2021-07-14 ENCOUNTER — Ambulatory Visit: Payer: BC Managed Care – PPO | Admitting: Internal Medicine

## 2021-07-14 ENCOUNTER — Encounter: Payer: Self-pay | Admitting: Internal Medicine

## 2021-07-14 VITALS — BP 128/80 | HR 109 | Temp 97.8°F | Ht 65.0 in | Wt 273.0 lb

## 2021-07-14 DIAGNOSIS — F3181 Bipolar II disorder: Secondary | ICD-10-CM | POA: Diagnosis not present

## 2021-07-14 DIAGNOSIS — R0683 Snoring: Secondary | ICD-10-CM | POA: Diagnosis not present

## 2021-07-14 DIAGNOSIS — R519 Headache, unspecified: Secondary | ICD-10-CM

## 2021-07-14 DIAGNOSIS — E782 Mixed hyperlipidemia: Secondary | ICD-10-CM

## 2021-07-14 DIAGNOSIS — G4701 Insomnia due to medical condition: Secondary | ICD-10-CM

## 2021-07-14 DIAGNOSIS — E282 Polycystic ovarian syndrome: Secondary | ICD-10-CM

## 2021-07-14 DIAGNOSIS — F411 Generalized anxiety disorder: Secondary | ICD-10-CM

## 2021-07-14 DIAGNOSIS — F410 Panic disorder [episodic paroxysmal anxiety] without agoraphobia: Secondary | ICD-10-CM

## 2021-07-14 MED ORDER — INSULIN PEN NEEDLE 31G X 5 MM MISC: 1 refills | Status: DC

## 2021-07-14 MED ORDER — SAXENDA 18 MG/3ML ~~LOC~~ SOPN: PEN_INJECTOR | SUBCUTANEOUS | 0 refills | Status: DC

## 2021-07-14 NOTE — Progress Notes (Signed)
Subjective:    Patient ID: Jill Sandoval, female    DOB: April 14, 1990, 31 y.o.   MRN: [id number]  HPI  Patient presents to clinic today for follow-up of chronic conditions.  HLD: Her last LDL was 105, triglycerides 122, 08/2018.  She denies myalgias on Atorvastatin.  She does not consume a low-fat diet.  Bipolar/Anxiety with Panic Disorder: Chronic, managed on Buspirone, Hydroxyzine, Oxcarbazepine and Risperidone.  She is currently seeing a therapist and a psychiatrist.  She denies SI/HI.  Frequent Headaches: This is rarely an issues.  Triggered by stress.  She denies breakthrough on Nortriptyline.  She does not follow with neurology.  Insomnia: She has difficulty falling and staying asleep.  She is taking Nortriptyline and Risperidone as prescribed.  There is no sleep study on file.  PCOS: She has been having difficulty losing weight.  She is taking OCPs as prescribed.  She is followed by gynecology.  Review of Systems     Past Medical History:  Diagnosis Date   Anxiety and depression    Asthma    inhaler prn   Fetal demise > 22 weeks, delivered, current hospitalization 11/04/2015   Miscarriage     Current Outpatient Medications  Medication Sig Dispense Refill   atorvastatin (LIPITOR) 10 MG tablet Take 1 tablet by mouth once daily 90 tablet 3   azithromycin (ZITHROMAX) 250 MG tablet Take 2 tabs today, then 1 tab daily x 4 days 6 tablet 0   busPIRone (BUSPAR) 10 MG tablet Take 1 tablet (10 mg total) by mouth 2 (two) times daily. 180 tablet 0   HYDROcodone bit-homatropine (HYCODAN) 5-1.5 MG/5ML syrup Take 5 mLs by mouth every 8 (eight) hours as needed for cough. 120 mL 0   hydrOXYzine (VISTARIL) 25 MG capsule Take 1-2 capsules (25-50 mg total) by mouth as directed. Take for severe panic attacks 60 capsule 1   LARIN 24 FE 1-20 MG-MCG(24) tablet Take 1 tablet by mouth daily.     nortriptyline (PAMELOR) 25 MG capsule TAKE 3 CAPSULES BY MOUTH AT BEDTIME 90 capsule 2   Oxcarbazepine  (TRILEPTAL) 300 MG tablet TAKE 3 TABLETS BY MOUTH TWICE DAILY IN THE MORNING AND IN THE EVENING 540 tablet 0   risperiDONE (RISPERDAL) 0.5 MG tablet TAKE 1 & 1/2 (ONE & ONE-HALF) TABLETS BY MOUTH AT BEDTIME 135 tablet 0   No current facility-administered medications for this visit.    No Known Allergies  Family History  Problem Relation Age of Onset   Hypertension Maternal Grandmother    Prostate cancer Maternal Grandfather    Hearing loss Brother    Hearing loss Paternal Grandfather    Hypertension Father    Other Neg Hx     Social History   Socioeconomic History   Marital status: Married    Spouse name: Not on file   Number of children: 2   Years of education: Not on file   Highest education level: Not on file  Occupational History   Not on file  Tobacco Use   Smoking status: Former    Packs/day: 0.25    Types: Cigarettes    Quit date: 11/14/2018    Years since quitting: 2.6   Smokeless tobacco: Never  Vaping Use   Vaping Use: Some days  Substance and Sexual Activity   Alcohol use: No   Drug use: No   Sexual activity: Yes    Birth control/protection: None  Other Topics Concern   Not on file  Social History Narrative  Married.   2 children.   Works at [employer].   Enjoys camping, being outdoors.    Social Determinants of Health   Financial Resource Strain: Not on file  Food Insecurity: Not on file  Transportation Needs: Not on file  Physical Activity: Not on file  Stress: Not on file  Social Connections: Not on file  Intimate Partner Violence: Not on file     Constitutional: Patient reports intermittent headaches, difficulty losing weight.  Denies fever, malaise, fatigue, or abrupt weight changes.  HEENT: Denies eye pain, eye redness, ear pain, ringing in the ears, wax buildup, runny nose, nasal congestion, bloody nose, or sore throat. Respiratory: Denies difficulty breathing, shortness of breath, cough or sputum production.   Cardiovascular: Denies  chest pain, chest tightness, palpitations or swelling in the hands or feet.  Gastrointestinal: Denies abdominal pain, bloating, constipation, diarrhea or blood in the stool.  GU: Denies urgency, frequency, pain with urination, burning sensation, blood in urine, odor or discharge. Musculoskeletal: Denies decrease in range of motion, difficulty with gait, muscle pain or joint pain and swelling.  Skin: Denies redness, rashes, lesions or ulcercations.  Neurological: Patient reports insomnia.  Denies dizziness, difficulty with memory, difficulty with speech or problems with balance and coordination.  Psych: Patient has a history of anxiety and depression.  Denies SI/HI.  No other specific complaints in a complete review of systems (except as listed in HPI above).  Objective:   Physical Exam  BP 128/80 (BP Location: Left Arm, Patient Position: Sitting, Cuff Size: Large)   Pulse (!) 109   Temp 97.8 F (36.6 C) (Temporal)   Ht 5\' 5"  (1.651 m)   Wt 273 lb (123.8 kg)   SpO2 99%   BMI 45.43 kg/m   Wt Readings from Last 3 Encounters:  09/15/20 257 lb 3.2 oz (116.7 kg)  04/27/20 246 lb (111.6 kg)  08/27/19 (!) 251 lb (113.9 kg)    General: Appears her stated age, obese, in NAD. Skin: Warm, dry and intact.  HEENT: Head: normal shape and size; Eyes: sclera white, no icterus, conjunctiva pink, PERRLA and EOMs intact;  Neck:  Neck supple, trachea midline. No masses, lumps or thyromegaly present.  Cardiovascular: Tachycardic with normal rhythm. S1,S2 noted.  No murmur, rubs or gallops noted. No JVD or BLE edema.  Pulmonary/Chest: Normal effort and positive vesicular breath sounds. No respiratory distress. No wheezes, rales or ronchi noted.  Musculoskeletal: No difficulty with gait.  Neurological: Alert and oriented.  Psychiatric: Mood and affect normal. Behavior is normal. Judgment and thought content normal.    BMET    Component Value Date/Time   NA 139 09/15/2020 0955   NA 136 06/30/2015  0000   NA 137 07/28/2011 1558   K 4.4 09/15/2020 0955   K 3.7 07/28/2011 1558   CL 104 09/15/2020 0955   CL 107 07/28/2011 1558   CO2 26 09/15/2020 0955   CO2 22 07/28/2011 1558   GLUCOSE 80 09/15/2020 0955   GLUCOSE 76 07/28/2011 1558   BUN 14 09/15/2020 0955   BUN 9 06/30/2015 0000   BUN 11 07/28/2011 1558   CREATININE 0.73 09/15/2020 0955   CALCIUM 9.6 09/15/2020 0955   CALCIUM 9.1 07/28/2011 1558   GFRNONAA >60 06/09/2020 0946   GFRNONAA >60 07/28/2011 1558   GFRAA 136 06/30/2015 0000   GFRAA >60 07/28/2011 1558    Lipid Panel     Component Value Date/Time   CHOL 180 09/15/2020 0955   CHOL 194 06/30/2015 0000  TRIG 122 09/15/2020 0955   HDL 53 09/15/2020 0955   HDL 57 06/30/2015 0000   CHOLHDL 3.4 09/15/2020 0955   VLDL 20 06/14/2020 0927   LDLCALC 105 (H) 09/15/2020 0955    CBC    Component Value Date/Time   WBC 8.1 09/15/2020 0955   RBC 4.53 09/15/2020 0955   HGB 12.9 09/15/2020 0955   HGB 12.6 06/30/2015 0000   HCT 39.3 09/15/2020 0955   HCT 38.1 06/30/2015 0000   PLT 286 09/15/2020 0955   PLT 320 06/30/2015 0000   MCV 86.8 09/15/2020 0955   MCV 84 06/30/2015 0000   MCV 89 07/28/2011 1558   MCH 28.5 09/15/2020 0955   MCHC 32.8 09/15/2020 0955   RDW 12.8 09/15/2020 0955   RDW 13.9 06/30/2015 0000   RDW 13.3 07/28/2011 1558   LYMPHSABS 2.2 06/09/2020 0946   LYMPHSABS 2.4 06/30/2015 0000   MONOABS 0.4 06/09/2020 0946   EOSABS 0.3 06/09/2020 0946   EOSABS 0.2 06/30/2015 0000   BASOSABS 0.0 06/09/2020 0946   BASOSABS 0.0 06/30/2015 0000    Hgb A1C Lab Results  Component Value Date   HGBA1C 4.8 09/15/2020             Assessment & Plan:     Nicki Reaper, NP

## 2021-07-14 NOTE — Assessment & Plan Note (Signed)
Stable on buspirone, hydroxyzine, oxcarbazepine and risperidone She will continue to see her therapist and psychiatrist Support offered

## 2021-07-14 NOTE — Assessment & Plan Note (Signed)
Controlled on nortriptyline

## 2021-07-14 NOTE — Assessment & Plan Note (Signed)
We will trial Saxenda Encourage low-carb diet and exercise for weight loss

## 2021-07-14 NOTE — Patient Instructions (Signed)
Calorie Counting for Weight Loss Calories are units of energy. Your body needs a certain number of calories from food to keep going throughout the day. When you eat or drink more calories than your body needs, your body stores the extra calories mostly as fat. When you eat or drink fewer calories than your body needs, your body burns fat to get the energy it needs. Calorie counting means keeping track of how many calories you eat and drink each day. Calorie counting can be helpful if you need to lose weight. If you eat fewer calories than your body needs, you should lose weight. Ask your health care provider what a healthy weight is for you. For calorie counting to work, you will need to eat the right number of calories each day to lose a healthy amount of weight per week. A dietitian can help you figure out how many calories you need in a day and will suggest ways to reach your calorie goal. A healthy amount of weight to lose each week is usually 1-2 lb (0.5-0.9 kg). This usually means that your daily calorie intake should be reduced by 500-750 calories. Eating 1,200-1,500 calories a day can help most women lose weight. Eating 1,500-1,800 calories a day can help most men lose weight. What do I need to know about calorie counting? Work with your health care provider or dietitian to determine how many calories you should get each day. To meet your daily calorie goal, you will need to: Find out how many calories are in each food that you would like to eat. Try to do this before you eat. Decide how much of the food you plan to eat. Keep a food log. Do this by writing down what you ate and how many calories it had. To successfully lose weight, it is important to balance calorie counting with a healthy lifestyle that includes regular activity. Where do I find calorie information?  The number of calories in a food can be found on a Nutrition Facts label. If a food does not have a Nutrition Facts label, try  to look up the calories online or ask your dietitian for help. Remember that calories are listed per serving. If you choose to have more than one serving of a food, you will have to multiply the calories per serving by the number of servings you plan to eat. For example, the label on a package of bread might say that a serving size is 1 slice and that there are 90 calories in a serving. If you eat 1 slice, you will have eaten 90 calories. If you eat 2 slices, you will have eaten 180 calories. How do I keep a food log? After each time that you eat, record the following in your food log as soon as possible: What you ate. Be sure to include toppings, sauces, and other extras on the food. How much you ate. This can be measured in cups, ounces, or number of items. How many calories were in each food and drink. The total number of calories in the food you ate. Keep your food log near you, such as in a pocket-sized notebook or on an app or website on your mobile phone. Some programs will calculate calories for you and show you how many calories you have left to meet your daily goal. What are some portion-control tips? Know how many calories are in a serving. This will help you know how many servings you can have of a certain   food. Use a measuring cup to measure serving sizes. You could also try weighing out portions on a kitchen scale. With time, you will be able to estimate serving sizes for some foods. Take time to put servings of different foods on your favorite plates or in your favorite bowls and cups so you know what a serving looks like. Try not to eat straight from a food's packaging, such as from a bag or box. Eating straight from the package makes it hard to see how much you are eating and can lead to overeating. Put the amount you would like to eat in a cup or on a plate to make sure you are eating the right portion. Use smaller plates, glasses, and bowls for smaller portions and to prevent  overeating. Try not to multitask. For example, avoid watching TV or using your computer while eating. If it is time to eat, sit down at a table and enjoy your food. This will help you recognize when you are full. It will also help you be more mindful of what and how much you are eating. What are tips for following this plan? Reading food labels Check the calorie count compared with the serving size. The serving size may be smaller than what you are used to eating. Check the source of the calories. Try to choose foods that are high in protein, fiber, and vitamins, and low in saturated fat, trans fat, and sodium. Shopping Read nutrition labels while you shop. This will help you make healthy decisions about which foods to buy. Pay attention to nutrition labels for low-fat or fat-free foods. These foods sometimes have the same number of calories or more calories than the full-fat versions. They also often have added sugar, starch, or salt to make up for flavor that was removed with the fat. Make a grocery list of lower-calorie foods and stick to it. Cooking Try to cook your favorite foods in a healthier way. For example, try baking instead of frying. Use low-fat dairy products. Meal planning Use more fruits and vegetables. One-half of your plate should be fruits and vegetables. Include lean proteins, such as chicken, turkey, and fish. Lifestyle Each week, aim to do one of the following: 150 minutes of moderate exercise, such as walking. 75 minutes of vigorous exercise, such as running. General information Know how many calories are in the foods you eat most often. This will help you calculate calorie counts faster. Find a way of tracking calories that works for you. Get creative. Try different apps or programs if writing down calories does not work for you. What foods should I eat?  Eat nutritious foods. It is better to have a nutritious, high-calorie food, such as an avocado, than a food with  few nutrients, such as a bag of potato chips. Use your calories on foods and drinks that will fill you up and will not leave you hungry soon after eating. Examples of foods that fill you up are nuts and nut butters, vegetables, lean proteins, and high-fiber foods such as whole grains. High-fiber foods are foods with more than 5 g of fiber per serving. Pay attention to calories in drinks. Low-calorie drinks include water and unsweetened drinks. The items listed above may not be a complete list of foods and beverages you can eat. Contact a dietitian for more information. What foods should I limit? Limit foods or drinks that are not good sources of vitamins, minerals, or protein or that are high in unhealthy fats. These   include: Candy. Other sweets. Sodas, specialty coffee drinks, alcohol, and juice. The items listed above may not be a complete list of foods and beverages you should avoid. Contact a dietitian for more information. How do I count calories when eating out? Pay attention to portions. Often, portions are much larger when eating out. Try these tips to keep portions smaller: Consider sharing a meal instead of getting your own. If you get your own meal, eat only half of it. Before you start eating, ask for a container and put half of your meal into it. When available, consider ordering smaller portions from the menu instead of full portions. Pay attention to your food and drink choices. Knowing the way food is cooked and what is included with the meal can help you eat fewer calories. If calories are listed on the menu, choose the lower-calorie options. Choose dishes that include vegetables, fruits, whole grains, low-fat dairy products, and lean proteins. Choose items that are boiled, broiled, grilled, or steamed. Avoid items that are buttered, battered, fried, or served with cream sauce. Items labeled as crispy are usually fried, unless stated otherwise. Choose water, low-fat milk,  unsweetened iced tea, or other drinks without added sugar. If you want an alcoholic beverage, choose a lower-calorie option, such as a glass of wine or light beer. Ask for dressings, sauces, and syrups on the side. These are usually high in calories, so you should limit the amount you eat. If you want a salad, choose a garden salad and ask for grilled meats. Avoid extra toppings such as bacon, cheese, or fried items. Ask for the dressing on the side, or ask for olive oil and vinegar or lemon to use as dressing. Estimate how many servings of a food you are given. Knowing serving sizes will help you be aware of how much food you are eating at restaurants. Where to find more information Centers for Disease Control and Prevention: www.cdc.gov U.[place]. Department of Agriculture: myplate.gov Summary Calorie counting means keeping track of how many calories you eat and drink each day. If you eat fewer calories than your body needs, you should lose weight. A healthy amount of weight to lose per week is usually 1-2 lb (0.5-0.9 kg). This usually means reducing your daily calorie intake by 500-750 calories. The number of calories in a food can be found on a Nutrition Facts label. If a food does not have a Nutrition Facts label, try to look up the calories online or ask your dietitian for help. Use smaller plates, glasses, and bowls for smaller portions and to prevent overeating. Use your calories on foods and drinks that will fill you up and not leave you hungry shortly after a meal. This information is not intended to replace advice given to you by your health care provider. Make sure you discuss any questions you have with your health care provider. Document Revised: 02/26/2019 Document Reviewed: 02/26/2019 Elsevier Patient Education  2023 Elsevier Inc.  

## 2021-07-14 NOTE — Assessment & Plan Note (Signed)
Stable on buspirone, hydroxyzine, oxcarbazepine and risperidone She will continue to see her therapist and psychiatrist Support offered 

## 2021-07-14 NOTE — Assessment & Plan Note (Signed)
No improvement with nortriptyline and risperidone We will refer to pulmonology for consideration of sleep study

## 2021-07-14 NOTE — Assessment & Plan Note (Signed)
She will continue to follow with GYN 

## 2021-07-15 LAB — COMPLETE METABOLIC PANEL WITH GFR
AG Ratio: 2.2 (calc) (ref 1.0–2.5)
ALT: 21 U/L (ref 6–29)
AST: 11 U/L (ref 10–30)
Albumin: 4.3 g/dL (ref 3.6–5.1)
Alkaline phosphatase (APISO): 99 U/L (ref 31–125)
BUN: 15 mg/dL (ref 7–25)
CO2: 24 mmol/L (ref 20–32)
Calcium: 9.6 mg/dL (ref 8.6–10.2)
Chloride: 103 mmol/L (ref 98–110)
Creat: 0.86 mg/dL (ref 0.50–0.97)
Globulin: 2 g/dL (calc) (ref 1.9–3.7)
Glucose, Bld: 100 mg/dL (ref 65–139)
Potassium: 4.3 mmol/L (ref 3.5–5.3)
Sodium: 138 mmol/L (ref 135–146)
Total Bilirubin: 0.2 mg/dL (ref 0.2–1.2)
Total Protein: 6.3 g/dL (ref 6.1–8.1)
eGFR: 93 mL/min/{1.73_m2} (ref >=60)

## 2021-07-15 LAB — HEMOGLOBIN A1C
Hgb A1c MFr Bld: 5 % of total Hgb (ref <5.7)
Mean Plasma Glucose: 97 mg/dL
eAG (mmol/L): 5.4 mmol/L

## 2021-07-15 LAB — LIPID PANEL
Cholesterol: 199 mg/dL (ref <200)
HDL: 46 mg/dL — ABNORMAL LOW (ref >=50)
LDL Cholesterol (Calc): 117 mg/dL (calc) — ABNORMAL HIGH
Non-HDL Cholesterol (Calc): 153 mg/dL (calc) — ABNORMAL HIGH (ref <130)
Total CHOL/HDL Ratio: 4.3 (calc) (ref <5.0)
Triglycerides: 251 mg/dL — ABNORMAL HIGH (ref <150)

## 2021-07-17 ENCOUNTER — Encounter: Payer: Self-pay | Admitting: Internal Medicine

## 2021-07-18 ENCOUNTER — Encounter: Payer: Self-pay | Admitting: Internal Medicine

## 2021-08-03 ENCOUNTER — Ambulatory Visit (INDEPENDENT_AMBULATORY_CARE_PROVIDER_SITE_OTHER): Payer: BC Managed Care – PPO | Admitting: Psychiatry

## 2021-08-03 ENCOUNTER — Encounter: Payer: Self-pay | Admitting: Psychiatry

## 2021-08-03 VITALS — BP 113/64 | HR 74 | Ht 63.0 in | Wt 266.0 lb

## 2021-08-03 DIAGNOSIS — F3176 Bipolar disorder, in full remission, most recent episode depressed: Secondary | ICD-10-CM | POA: Diagnosis not present

## 2021-08-03 DIAGNOSIS — G4701 Insomnia due to medical condition: Secondary | ICD-10-CM | POA: Diagnosis not present

## 2021-08-03 DIAGNOSIS — Z98891 History of uterine scar from previous surgery: Secondary | ICD-10-CM | POA: Insufficient documentation

## 2021-08-03 DIAGNOSIS — F410 Panic disorder [episodic paroxysmal anxiety] without agoraphobia: Secondary | ICD-10-CM | POA: Diagnosis not present

## 2021-08-03 MED ORDER — NORTRIPTYLINE HCL 75 MG PO CAPS: 75.0000 mg | ORAL_CAPSULE | Freq: Every day | ORAL | 0 refills | Status: DC

## 2021-08-03 MED ORDER — BUSPIRONE HCL 10 MG PO TABS: 10.0000 mg | ORAL_TABLET | Freq: Two times a day (BID) | ORAL | 0 refills | Status: DC

## 2021-08-03 MED ORDER — RISPERIDONE 0.5 MG PO TABS: 0.5000 mg | ORAL_TABLET | Freq: Every day | ORAL | 0 refills | Status: DC

## 2021-08-03 MED ORDER — HYDROXYZINE PAMOATE 25 MG PO CAPS: 25.0000 mg | ORAL_CAPSULE | ORAL | 1 refills | Status: AC

## 2021-08-03 NOTE — Progress Notes (Signed)
[facility] MD OP Progress Note  08/03/2021 9:25 AM Jill Sandoval  MRN:  [id number]  Chief Complaint:  Chief Complaint  Patient presents with   Follow-up: 31 year old Caucasian female with history of bipolar disorder, panic disorder, presented for medication management.   HPI: Jill Sandoval is a 31 year old Caucasian female, married, currently lives in [place], has a history of bipolar disorder type II was evaluated in office today.  Patient today reports she is currently doing fairly well.  Denies any significant mood swings.  Reports she had 1 panic attack several weeks ago when she had intrusive memories about her son who passed away.  However she was able to cope with it and she did not have it after that.  Currently uses hydroxyzine as needed which helps when she has panic attacks.  Has been limiting use.  Denies any depressive symptoms.  Denies any suicidality, homicidality or perceptual disturbances.  Reports sleep has improved.  However continues to wake up in the middle of the night to urinate.  Drinks a lot of fluids towards the end of the day which likely could be contributing to that.  Patient currently compliant on all her medications.  Agreeable to dosage reduction of risperidone.  Denies side effects.  Currently on weight loss medication, Saxenda, may have lost 6 pounds in the past 3 weeks.  Reports appetite continues to be good and has been eating enough for herself.  Excited about her weight loss.  Denies any other concerns today.  Visit Diagnosis:    ICD-10-CM   1. Bipolar disorder, in full remission, most recent episode depressed (HCC)  F31.76 risperiDONE (RISPERDAL) 0.5 MG tablet    nortriptyline (PAMELOR) 75 MG capsule    2. Panic disorder  F41.0 hydrOXYzine (VISTARIL) 25 MG capsule    nortriptyline (PAMELOR) 75 MG capsule    busPIRone (BUSPAR) 10 MG tablet    3. Insomnia due to medical condition  G47.01    Anxiety      Past Psychiatric History: Reviewed past  psychiatric history from progress note on 10/15/2018.  Past trials of Celexa, Trileptal.  Past Medical History:  Past Medical History:  Diagnosis Date   Anxiety and depression    Asthma    inhaler prn   Fetal demise > 22 weeks, delivered, current hospitalization 11/04/2015   Miscarriage     Past Surgical History:  Procedure Laterality Date   CESAREAN SECTION N/A 03/24/2012   Procedure: CESAREAN SECTION;  Surgeon: Mickel Baas, MD;  Location: [facility];  Service: Obstetrics;  Laterality: N/A;   CESAREAN SECTION N/A 09/24/2016   Procedure: CESAREAN SECTION;  Surgeon: Waynard Reeds, MD;  Location: [facility];  Service: Obstetrics;  Laterality: N/A;    Family Psychiatric History: Reviewed family psychiatric history from progress note on 10/15/2018.  Family History:  Family History  Problem Relation Age of Onset   Hypertension Maternal Grandmother    Prostate cancer Maternal Grandfather    Hearing loss Brother    Hearing loss Paternal Grandfather    Hypertension Father    Other Neg Hx     Social History: Reviewed social history from progress note on 10/15/2018. Social History   Socioeconomic History   Marital status: Married    Spouse name: Not on file   Number of children: 2   Years of education: Not on file   Highest education level: Not on file  Occupational History   Not on file  Tobacco Use   Smoking status: Former  Packs/day: 0.25    Types: Cigarettes    Quit date: 11/14/2018    Years since quitting: 2.7   Smokeless tobacco: Never  Vaping Use   Vaping Use: Some days  Substance and Sexual Activity   Alcohol use: No   Drug use: No   Sexual activity: Yes    Birth control/protection: None  Other Topics Concern   Not on file  Social History Narrative   Married.   2 children.   Works at [employer].   Enjoys camping, being outdoors.    Social Determinants of Health   Financial Resource Strain: Not on file  Food Insecurity: Not on file  Transportation  Needs: Not on file  Physical Activity: Not on file  Stress: Not on file  Social Connections: Not on file    Allergies: No Known Allergies  Metabolic Disorder Labs: Lab Results  Component Value Date   HGBA1C 5.0 07/14/2021   MPG 97 07/14/2021   MPG 91 09/15/2020   No results found for: "PROLACTIN" Lab Results  Component Value Date   CHOL 199 07/14/2021   TRIG 251 (H) 07/14/2021   HDL 46 (L) 07/14/2021   CHOLHDL 4.3 07/14/2021   VLDL 20 06/14/2020   LDLCALC 117 (H) 07/14/2021   LDLCALC 105 (H) 09/15/2020   Lab Results  Component Value Date   TSH 1.34 04/27/2020   TSH 1.42 07/08/2019    Therapeutic Level Labs: No results found for: "LITHIUM" No results found for: "VALPROATE" No results found for: "CBMZ"  Current Medications: Current Outpatient Medications  Medication Sig Dispense Refill   atorvastatin (LIPITOR) 10 MG tablet Take 1 tablet by mouth daily.     Insulin Pen Needle 31G X 5 MM MISC BD Pen Needles- brand specific Inject insulin via insulin pen 6 x daily 90 each 1   nortriptyline (PAMELOR) 75 MG capsule Take 1 capsule (75 mg total) by mouth at bedtime. 90 capsule 0   Oxcarbazepine (TRILEPTAL) 300 MG tablet TAKE 3 TABLETS BY MOUTH TWICE DAILY IN THE MORNING AND IN THE EVENING 540 tablet 0   SAXENDA 18 MG/3ML SOPN Injection 0.6 mg into skin once daily for 1 week, as tolerated increase by increment of 0.6mg  every 1 week, max dose is 3mg  injection daily after 5 weeks. 15 mL 0   busPIRone (BUSPAR) 10 MG tablet Take 1 tablet (10 mg total) by mouth 2 (two) times daily. 180 tablet 0   hydrOXYzine (VISTARIL) 25 MG capsule Take 1-2 capsules (25-50 mg total) by mouth as directed. Take for severe panic attacks 60 capsule 1   risperiDONE (RISPERDAL) 0.5 MG tablet Take 1 tablet (0.5 mg total) by mouth at bedtime. 90 tablet 0   No current facility-administered medications for this visit.     Musculoskeletal: Strength & Muscle Tone: within normal limits Gait & Station:  normal Patient leans: N/A  Psychiatric Specialty Exam: Review of Systems  Psychiatric/Behavioral:  Positive for sleep disturbance.   All other systems reviewed and are negative.   Blood pressure 113/64, pulse 74, height 5\' 3"  (1.6 m), weight 266 lb (120.7 kg), SpO2 98 %.Body mass index is 47.12 kg/m.  General Appearance: Casual  Eye Contact:  Fair  Speech:  Clear and Coherent  Volume:  Normal  Mood:  Euthymic  Affect:  Congruent  Thought Process:  Goal Directed and Descriptions of Associations: Intact  Orientation:  Full (Time, Place, and Person)  Thought Content: Logical   Suicidal Thoughts:  No  Homicidal Thoughts:  No  Memory:  Immediate;   Fair Recent;   Fair Remote;   Fair  Judgement:  Fair  Insight:  Fair  Psychomotor Activity:  Normal  Concentration:  Concentration: Fair and Attention Span: Fair  Recall:  [employer] of Knowledge: Fair  Language: Fair  Akathisia:  No  Handed:  Right  AIMS (if indicated): done  Assets:  Communication Skills Desire for Improvement Housing Social Support  ADL's:  Intact  Cognition: WNL  Sleep:   improving   Screenings: AIMS    Flowsheet Row Office Visit from 08/03/2021 in [facility] Video Visit from 05/19/2021 in [facility] Video Visit from 02/17/2021 in [facility]  AIMS Total Score 0 0 0      GAD-7    Flowsheet Row Office Visit from 07/14/2021 in [facility] Video Visit from 02/17/2021 in [facility] Video Visit from 03/29/2020 in [facility] Counselor from 08/12/2019 in [facility] Office Visit from 07/08/2018 in [facility]  Total GAD-7 Score 1 0 18 18 8       PHQ2-9    Flowsheet Row Office Visit from 08/03/2021 in [facility] Office Visit from 07/14/2021 in [facility] Video  Visit from 05/19/2021 in [facility] Video Visit from 02/17/2021 in [facility] Video Visit from 07/14/2020 in [facility]  PHQ-2 Total Score 0 1 0 0 0  PHQ-9 Total Score 1 7 -- -- --      Flowsheet Row Office Visit from 08/03/2021 in [facility] Video Visit from 05/19/2021 in [facility] ED from 10/06/2020 in [facility]   C-SSRS RISK CATEGORY No Risk No Risk No Risk        Assessment and Plan: Jill Sandoval is a 31 year old Caucasian female, married, lives in [place], has a history of bipolar disorder type II, panic attack was evaluated in office today.  Patient is currently improving.  Plan Bipolar disorder type II in remission Risperidone, we will reduce to 0.5 mg p.o. daily Trileptal 900 mg p.o. twice daily  Panic disorder-stable BuSpar 10 mg p.o. twice daily Nortriptyline 75 mg p.o. daily at bedtime Hydroxyzine 25-50 mg daily as needed for severe panic attacks  Insomnia-improving Patient advised to work on sleep hygiene techniques and to limit the amount of fluids close to bedtime. Nortriptyline 75 mg p.o. nightly Melatonin 3 to 5 mg at bedtime as needed Hydroxyzine 25-50 mg at bedtime as needed for sleep.  Follow-up in clinic in 3 months or sooner if needed.  This note was generated in part or whole with voice recognition software. Voice recognition is usually quite accurate but there are transcription errors that can and very often do occur. I apologize for any typographical errors that were not detected and corrected.       Rossano Stazione, MD 08/03/2021, 9:25 AM

## 2021-08-21 ENCOUNTER — Other Ambulatory Visit: Payer: Self-pay | Admitting: Internal Medicine

## 2021-08-21 MED ORDER — SAXENDA 18 MG/3ML ~~LOC~~ SOPN
3.0000 mg | PEN_INJECTOR | Freq: Every day | SUBCUTANEOUS | 0 refills | Status: DC
Start: 2021-08-21 — End: 2021-09-18

## 2021-08-30 ENCOUNTER — Other Ambulatory Visit: Payer: Self-pay

## 2021-08-31 ENCOUNTER — Other Ambulatory Visit: Payer: Self-pay | Admitting: Psychiatry

## 2021-08-31 DIAGNOSIS — F3176 Bipolar disorder, in full remission, most recent episode depressed: Secondary | ICD-10-CM

## 2021-09-18 ENCOUNTER — Other Ambulatory Visit: Payer: Self-pay

## 2021-09-18 MED ORDER — SAXENDA 18 MG/3ML ~~LOC~~ SOPN: 3.0000 mg | PEN_INJECTOR | Freq: Every day | SUBCUTANEOUS | 0 refills | Status: DC

## 2021-10-02 ENCOUNTER — Other Ambulatory Visit: Payer: Self-pay | Admitting: Psychiatry

## 2021-10-02 DIAGNOSIS — F410 Panic disorder [episodic paroxysmal anxiety] without agoraphobia: Secondary | ICD-10-CM

## 2021-10-03 ENCOUNTER — Telehealth: Payer: Self-pay | Admitting: *Deleted

## 2021-10-03 NOTE — Telephone Encounter (Signed)
Patient is calling because she wanting to know if she can wear her old boot given years ago. until she is seen in office. Please advise.

## 2021-10-05 ENCOUNTER — Other Ambulatory Visit: Payer: Self-pay | Admitting: Psychiatry

## 2021-10-05 DIAGNOSIS — F410 Panic disorder [episodic paroxysmal anxiety] without agoraphobia: Secondary | ICD-10-CM

## 2021-10-10 ENCOUNTER — Ambulatory Visit: Payer: BC Managed Care – PPO | Admitting: Podiatry

## 2021-10-24 ENCOUNTER — Ambulatory Visit: Payer: BC Managed Care – PPO | Admitting: Podiatry

## 2021-10-24 DIAGNOSIS — M7662 Achilles tendinitis, left leg: Secondary | ICD-10-CM | POA: Diagnosis not present

## 2021-10-27 ENCOUNTER — Other Ambulatory Visit: Payer: Self-pay | Admitting: Psychiatry

## 2021-10-27 DIAGNOSIS — F3176 Bipolar disorder, in full remission, most recent episode depressed: Secondary | ICD-10-CM

## 2021-10-27 DIAGNOSIS — F410 Panic disorder [episodic paroxysmal anxiety] without agoraphobia: Secondary | ICD-10-CM

## 2021-10-27 NOTE — Progress Notes (Signed)
Subjective:  Patient ID: Jill Sandoval, female    DOB: [birth year],  MRN: [id number]  Chief Complaint  Patient presents with   Foot Pain    31 y.o. female presents with the above complaint.  Patient presents with complaint of left Achilles tendinitis insertional pain.  Patient states painful to touch she notices it worse after walking for long period of time.  She started that the boot does help with the pain.  But has none calmed down.  She denies any other acute complaints she has not seen anyone as prior to seeing me pain scale 7 out of 10 dull achy in nature worse with dorsiflexion of the foot.   Review of Systems: Negative except as noted in the HPI. Denies N/V/F/Ch.  Past Medical History:  Diagnosis Date   Anxiety and depression    Asthma    inhaler prn   Fetal demise > 22 weeks, delivered, current hospitalization 11/04/2015   Miscarriage     Current Outpatient Medications:    atorvastatin (LIPITOR) 10 MG tablet, Take 1 tablet by mouth daily., Disp: , Rfl:    busPIRone (BUSPAR) 10 MG tablet, Take 1 tablet (10 mg total) by mouth 2 (two) times daily., Disp: 180 tablet, Rfl: 0   hydrOXYzine (VISTARIL) 25 MG capsule, Take 1-2 capsules (25-50 mg total) by mouth as directed. Take for severe panic attacks, Disp: 60 capsule, Rfl: 1   Insulin Pen Needle 31G X 5 MM MISC, BD Pen Needles- brand specific Inject insulin via insulin pen 6 x daily, Disp: 90 each, Rfl: 1   nortriptyline (PAMELOR) 75 MG capsule, Take 1 capsule (75 mg total) by mouth at bedtime., Disp: 90 capsule, Rfl: 0   Oxcarbazepine (TRILEPTAL) 300 MG tablet, TAKE 3 TABLETS BY MOUTH TWICE DAILY IN THE MORNING AND IN THE EVENING, Disp: 540 tablet, Rfl: 0   risperiDONE (RISPERDAL) 0.5 MG tablet, Take 1 tablet (0.5 mg total) by mouth at bedtime., Disp: 90 tablet, Rfl: 0   SAXENDA 18 MG/3ML SOPN, Inject 3 mg into the skin daily. Injection 0.6 mg into skin once daily for 1 week, as tolerated increase by increment of 0.6mg  every 1  week, max dose is 3mg  injection daily after 5 weeks., Disp: 15 mL, Rfl: 0  Social History   Tobacco Use  Smoking Status Former   Packs/day: 0.25   Types: Cigarettes   Quit date: 11/14/2018   Years since quitting: 2.9  Smokeless Tobacco Never    No Known Allergies Objective:  There were no vitals filed for this visit. There is no height or weight on file to calculate BMI. Constitutional Well developed. Well nourished.  Vascular Dorsalis pedis pulses palpable bilaterally. Posterior tibial pulses palpable bilaterally. Capillary refill normal to all digits.  No cyanosis or clubbing noted. Pedal hair growth normal.  Neurologic Normal speech. Oriented to person, place, and time. Epicritic sensation to light touch grossly present bilaterally.  Dermatologic Nails well groomed and normal in appearance. No open wounds. No skin lesions.  Orthopedic: Pain on palpation of left Achilles tendinitis insertional pain.  Pain with range of motion of the joint especially the dorsiflexion no pain with plantarflexion of the ankle joint.  Positive Silfverskiold trace with gastrocnemius equinus.  Haglund's deformity clinically appreciated.   Radiographs: None Assessment:   1. Achilles tendinitis, left leg    Plan:  Patient was evaluated and treated and all questions answered.  Left Achilles tendinitis insertional pain -All questions and concerns were discussed with the patient in extensive  detail given the amount of pain that she is experiencing she will benefit from cam boot immobilization.  I have asked her to wear for next 4 weeks.  If there are still residual pain we will discuss MRI versus steroid injection during next clinical visit.  She states understanding -Cam boot was dispensed  No follow-ups on file.

## 2021-10-30 ENCOUNTER — Other Ambulatory Visit: Payer: Self-pay | Admitting: Psychiatry

## 2021-10-30 DIAGNOSIS — F410 Panic disorder [episodic paroxysmal anxiety] without agoraphobia: Secondary | ICD-10-CM

## 2021-11-15 ENCOUNTER — Encounter: Payer: Self-pay | Admitting: Psychiatry

## 2021-11-15 ENCOUNTER — Telehealth (INDEPENDENT_AMBULATORY_CARE_PROVIDER_SITE_OTHER): Payer: BC Managed Care – PPO | Admitting: Psychiatry

## 2021-11-15 DIAGNOSIS — F3176 Bipolar disorder, in full remission, most recent episode depressed: Secondary | ICD-10-CM

## 2021-11-15 DIAGNOSIS — G4701 Insomnia due to medical condition: Secondary | ICD-10-CM | POA: Diagnosis not present

## 2021-11-15 DIAGNOSIS — F410 Panic disorder [episodic paroxysmal anxiety] without agoraphobia: Secondary | ICD-10-CM | POA: Diagnosis not present

## 2021-11-15 DIAGNOSIS — Z79899 Other long term (current) drug therapy: Secondary | ICD-10-CM | POA: Diagnosis not present

## 2021-11-15 MED ORDER — RISPERIDONE 0.5 MG PO TABS: 0.5000 mg | ORAL_TABLET | Freq: Every day | ORAL | 0 refills | Status: DC

## 2021-11-15 NOTE — Progress Notes (Signed)
Virtual Visit via Video Note  I connected with Jill Sandoval on 11/15/21 at  9:00 AM EDT by a video enabled telemedicine application and verified that I am speaking with the correct person using two identifiers.  Location Provider Location : [facility] Patient Location : Home  Participants: Patient , Provider   I discussed the limitations of evaluation and management by telemedicine and the availability of in person appointments. The patient expressed understanding and agreed to proceed   I discussed the assessment and treatment plan with the patient. The patient was provided an opportunity to ask questions and all were answered. The patient agreed with the plan and demonstrated an understanding of the instructions.   The patient was advised to call back or seek an in-person evaluation if the symptoms worsen or if the condition fails to improve as anticipated.    [facility] MD OP Progress Note  11/15/2021 9:26 AM Jill Sandoval  MRN:  [id number]  Chief Complaint:  Chief Complaint  Patient presents with   Follow-up   Anxiety   Medication Refill   HPI: Jill Sandoval is a 31 year old Caucasian female, married, currently lives in [place], has a history of bipolar disorder type II, panic disorder, insomnia, was evaluated by telemedicine today.  Patient today reports overall she is doing well with regards to her mood.  Denies any hypomanic or manic symptoms.  Denies any depression symptoms.  Reports she has not had any significant panic attacks recently.  Anxiety is overall under control.  Does have hydroxyzine as needed available however has not been using it much.  Reports sleep as overall okay, improving.  Reports she sleeps around 6-1/2 to 7 hours at night.  Feels rested when she wakes up in the morning.  She takes the nortriptyline at night which does help.  Denies side effects.  Reports appetite as fair.  Denies any suicidality, homicidality or perceptual disturbances.  Denies any  side effects to any of her medications, compliant on them.  Denies any other concerns today.  Visit Diagnosis:    ICD-10-CM   1. Bipolar disorder, in full remission, most recent episode depressed (HCC)  F31.76 risperiDONE (RISPERDAL) 0.5 MG tablet    2. Panic disorder  F41.0     3. Insomnia due to medical condition  G47.01    Anxiety    4. High risk medication use  Z79.899 Sodium    Platelet count    Hepatic function panel      Past Psychiatric History: Reviewed past psychiatric history from progress note on 10/15/2018.  Past trials of Celexa, Trileptal.  Past Medical History:  Past Medical History:  Diagnosis Date   Anxiety and depression    Asthma    inhaler prn   Fetal demise > 22 weeks, delivered, current hospitalization 11/04/2015   Miscarriage     Past Surgical History:  Procedure Laterality Date   CESAREAN SECTION N/A 03/24/2012   Procedure: CESAREAN SECTION;  Surgeon: Mickel Baas, MD;  Location: [facility];  Service: Obstetrics;  Laterality: N/A;   CESAREAN SECTION N/A 09/24/2016   Procedure: CESAREAN SECTION;  Surgeon: Waynard Reeds, MD;  Location: [facility];  Service: Obstetrics;  Laterality: N/A;    Family Psychiatric History: Reviewed family psychiatric history from progress note on 10/15/2018.  Family History:  Family History  Problem Relation Age of Onset   Hypertension Maternal Grandmother    Prostate cancer Maternal Grandfather    Hearing loss Brother    Hearing loss Paternal Actor  Hypertension Father    Other Neg Hx     Social History: Reviewed social history from progress note on 10/15/2018. Social History   Socioeconomic History   Marital status: Married    Spouse name: Not on file   Number of children: 2   Years of education: Not on file   Highest education level: Not on file  Occupational History   Not on file  Tobacco Use   Smoking status: Former    Packs/day: 0.25    Types: Cigarettes    Quit date: 11/14/2018     Years since quitting: 3.0   Smokeless tobacco: Never  Vaping Use   Vaping Use: Some days  Substance and Sexual Activity   Alcohol use: No   Drug use: No   Sexual activity: Yes    Birth control/protection: None  Other Topics Concern   Not on file  Social History Narrative   Married.   2 children.   Works at [employer].   Enjoys camping, being outdoors.    Social Determinants of Health   Financial Resource Strain: Not on file  Food Insecurity: Not on file  Transportation Needs: Not on file  Physical Activity: Not on file  Stress: Not on file  Social Connections: Not on file    Allergies: No Known Allergies  Metabolic Disorder Labs: Lab Results  Component Value Date   HGBA1C 5.0 07/14/2021   MPG 97 07/14/2021   MPG 91 09/15/2020   No results found for: "PROLACTIN" Lab Results  Component Value Date   CHOL 199 07/14/2021   TRIG 251 (H) 07/14/2021   HDL 46 (L) 07/14/2021   CHOLHDL 4.3 07/14/2021   VLDL 20 06/14/2020   LDLCALC 117 (H) 07/14/2021   LDLCALC 105 (H) 09/15/2020   Lab Results  Component Value Date   TSH 1.34 04/27/2020   TSH 1.42 07/08/2019    Therapeutic Level Labs: No results found for: "LITHIUM" No results found for: "VALPROATE" No results found for: "CBMZ"  Current Medications: Current Outpatient Medications  Medication Sig Dispense Refill   atorvastatin (LIPITOR) 10 MG tablet Take 1 tablet by mouth daily.     busPIRone (BUSPAR) 10 MG tablet Take 1 tablet by mouth twice daily 180 tablet 0   hydrOXYzine (VISTARIL) 25 MG capsule Take 1-2 capsules (25-50 mg total) by mouth as directed. Take for severe panic attacks 60 capsule 1   nortriptyline (PAMELOR) 75 MG capsule Take 1 capsule by mouth at bedtime 90 capsule 0   Oxcarbazepine (TRILEPTAL) 300 MG tablet TAKE 3 TABLETS BY MOUTH TWICE DAILY IN THE MORNING AND IN THE EVENING 540 tablet 0   Insulin Pen Needle 31G X 5 MM MISC BD Pen Needles- brand specific Inject insulin via insulin pen 6 x daily  (Patient not taking: Reported on 11/15/2021) 90 each 1   risperiDONE (RISPERDAL) 0.5 MG tablet Take 1 tablet (0.5 mg total) by mouth at bedtime. 90 tablet 0   SAXENDA 18 MG/3ML SOPN Inject 3 mg into the skin daily. Injection 0.6 mg into skin once daily for 1 week, as tolerated increase by increment of 0.6mg  every 1 week, max dose is 3mg  injection daily after 5 weeks. (Patient not taking: Reported on 11/15/2021) 15 mL 0   No current facility-administered medications for this visit.     Musculoskeletal: Strength & Muscle Tone:  UTA Gait & Station:  Seated Patient leans: N/A  Psychiatric Specialty Exam: Review of Systems  Psychiatric/Behavioral: Negative.    All other systems reviewed and  are negative.   There were no vitals taken for this visit.There is no height or weight on file to calculate BMI.  General Appearance: Casual  Eye Contact:  Fair  Speech:  Clear and Coherent  Volume:  Normal  Mood:  Euthymic  Affect:  Congruent  Thought Process:  Goal Directed and Descriptions of Associations: Intact  Orientation:  Full (Time, Place, and Person)  Thought Content: Logical   Suicidal Thoughts:  No  Homicidal Thoughts:  No  Memory:  Immediate;   Fair Recent;   Fair Remote;   Fair  Judgement:  Fair  Insight:  Fair  Psychomotor Activity:  Normal  Concentration:  Concentration: Fair and Attention Span: Fair  Recall:  [employer] of Knowledge: Fair  Language: Fair  Akathisia:  No  Handed:  Right  AIMS (if indicated): done  Assets:  Communication Skills Desire for Improvement Intimacy Social Support Talents/Skills Transportation  ADL's:  Intact  Cognition: WNL  Sleep:  Fair   Screenings: AIMS    Flowsheet Row Video Visit from 11/15/2021 in [facility] Office Visit from 08/03/2021 in [facility] Video Visit from 05/19/2021 in [facility] Video Visit from 02/17/2021 in [facility]  [facility]iatric Associates  AIMS Total Score 0 0 0 0      GAD-7    Flowsheet Row Video Visit from 11/15/2021 in [facility] Office Visit from 07/14/2021 in [facility] Video Visit from 02/17/2021 in [facility] Video Visit from 03/29/2020 in [facility] Counselor from 08/12/2019 in [facility]  Total GAD-7 Score 0 1 0 18 18      PHQ2-9    Flowsheet Row Video Visit from 11/15/2021 in [facility] Office Visit from 08/03/2021 in [facility] Office Visit from 07/14/2021 in [facility] Video Visit from 05/19/2021 in [facility] Video Visit from 02/17/2021 in [facility]  PHQ-2 Total Score 0 0 1 0 0  PHQ-9 Total Score 0 1 7 -- --      Flowsheet Row Video Visit from 11/15/2021 in [facility] Office Visit from 08/03/2021 in [facility] Video Visit from 05/19/2021 in [facility]  C-SSRS RISK CATEGORY No Risk No Risk No Risk        Assessment and Plan: Jill Sandoval is a 31 year old Caucasian female, married, lives in [place], has a history of bipolar disorder type II, panic attack was evaluated by telemedicine today.  Patient is currently stable.  Plan Bipolar disorder type II in remission Risperidone 0.5 mg p.o. daily-reduced dosage. Trileptal 900 mg p.o. twice daily  Panic disorder-stable BuSpar 10 mg p.o. twice daily Nortriptyline 75 mg p.o. daily at bedtime Hydroxyzine 25-50 mg p.o. daily as needed for severe panic attacks  Insomnia-improving Continue sleep hygiene techniques Nortriptyline 75 mg p.o. nightly Melatonin 3 to 5 mg at bedtime as needed Hydroxyzine 25-50 mg at bedtime as needed for sleep  High risk medication use-will repeat platelet  count, sodium, LFT-since she is on medications like Trileptal.  Patient aware that lab order is in the system for [facility] lab.  Follow-up in clinic in 3 to 4 months or sooner if needed.  Consent: Patient/Guardian gives verbal consent for treatment and assignment of benefits for services provided during this visit. Patient/Guardian expressed understanding and agreed to proceed.   This note was generated in part or  whole with voice recognition software. Voice recognition is usually quite accurate but there are transcription errors that can and very often do occur. I apologize for any typographical errors that were not detected and corrected.     Jomarie Longs, MD 11/15/2021, 9:26 AM

## 2021-11-21 ENCOUNTER — Ambulatory Visit: Payer: BC Managed Care – PPO | Admitting: Podiatry

## 2021-11-30 ENCOUNTER — Ambulatory Visit: Payer: BC Managed Care – PPO | Admitting: Podiatry

## 2021-11-30 ENCOUNTER — Encounter: Payer: Self-pay | Admitting: Podiatry

## 2021-11-30 DIAGNOSIS — M7662 Achilles tendinitis, left leg: Secondary | ICD-10-CM

## 2021-11-30 DIAGNOSIS — Q666 Other congenital valgus deformities of feet: Secondary | ICD-10-CM

## 2021-11-30 NOTE — Progress Notes (Signed)
Subjective:  Patient ID: Jill Sandoval, female    DOB: 17-Jul-1990,  MRN: [id number]  Chief Complaint  Patient presents with   Foot Pain    "It's doing better, still a little bit of tenderness there."    31 y.o. female presents with the above complaint.  Patient presents for follow-up of left Achilles tendinitis.  She states she is doing a lot better with cam boot immobilization still has still has a residual pain she would like to discuss treatment options for that.  She also would like to discuss orthotics option as well.   Review of Systems: Negative except as noted in the HPI. Denies N/V/F/Ch.  Past Medical History:  Diagnosis Date   Anxiety and depression    Asthma    inhaler prn   Fetal demise > 22 weeks, delivered, current hospitalization 11/04/2015   Miscarriage     Current Outpatient Medications:    atorvastatin (LIPITOR) 10 MG tablet, Take 1 tablet by mouth daily., Disp: , Rfl:    busPIRone (BUSPAR) 10 MG tablet, Take 1 tablet by mouth twice daily, Disp: 180 tablet, Rfl: 0   hydrOXYzine (VISTARIL) 25 MG capsule, Take 1-2 capsules (25-50 mg total) by mouth as directed. Take for severe panic attacks, Disp: 60 capsule, Rfl: 1   Insulin Pen Needle 31G X 5 MM MISC, BD Pen Needles- brand specific Inject insulin via insulin pen 6 x daily, Disp: 90 each, Rfl: 1   nortriptyline (PAMELOR) 75 MG capsule, Take 1 capsule by mouth at bedtime, Disp: 90 capsule, Rfl: 0   Oxcarbazepine (TRILEPTAL) 300 MG tablet, TAKE 3 TABLETS BY MOUTH TWICE DAILY IN THE MORNING AND IN THE EVENING, Disp: 540 tablet, Rfl: 0   risperiDONE (RISPERDAL) 0.5 MG tablet, Take 1 tablet (0.5 mg total) by mouth at bedtime., Disp: 90 tablet, Rfl: 0   SAXENDA 18 MG/3ML SOPN, Inject 3 mg into the skin daily. Injection 0.6 mg into skin once daily for 1 week, as tolerated increase by increment of 0.6mg  every 1 week, max dose is 3mg  injection daily after 5 weeks., Disp: 15 mL, Rfl: 0  Social History   Tobacco Use   Smoking Status Former   Packs/day: 0.25   Types: Cigarettes   Quit date: 11/14/2018   Years since quitting: 3.0  Smokeless Tobacco Never    No Known Allergies Objective:  There were no vitals filed for this visit. There is no height or weight on file to calculate BMI. Constitutional Well developed. Well nourished.  Vascular Dorsalis pedis pulses palpable bilaterally. Posterior tibial pulses palpable bilaterally. Capillary refill normal to all digits.  No cyanosis or clubbing noted. Pedal hair growth normal.  Neurologic Normal speech. Oriented to person, place, and time. Epicritic sensation to light touch grossly present bilaterally.  Dermatologic Nails well groomed and normal in appearance. No open wounds. No skin lesions.  Orthopedic: Pain on palpation of left Achilles tendinitis insertional pain.  Pain with range of motion of the joint especially the dorsiflexion no pain with plantarflexion of the ankle joint.  Positive Silfverskiold trace with gastrocnemius equinus.  Haglund's deformity clinically appreciated.   Radiographs: None Assessment:   1. Achilles tendinitis, left leg   2. Pes planovalgus     Plan:  Patient was evaluated and treated and all questions answered.  Left Achilles tendinitis insertional pain -All questions and concerns were discussed with the patient in extensive detail given the amount of pain that she is experiencing she will benefit from cam boot immobilization.  I have asked her to wear for next 4 weeks.  If there are still residual pain we will discuss MRI versus steroid injection during next clinical visit.  She states understanding -Cam boot was dispensed  Pes planovalgus -I explained to patient the etiology of pes planovalgus and relationship with Achilles tendinitis and various treatment options were discussed.  Given patient foot structure in the setting of Achilles tendinitis I believe patient will benefit from custom-made orthotics to help  control the hindfoot motion support the arch of the foot and take the stress away from plantar fascial.  Patient agrees with the plan like to proceed with orthotics -Patient was casted for orthotics   No follow-ups on file.

## 2021-12-25 ENCOUNTER — Other Ambulatory Visit: Payer: Self-pay | Admitting: Psychiatry

## 2021-12-25 DIAGNOSIS — F410 Panic disorder [episodic paroxysmal anxiety] without agoraphobia: Secondary | ICD-10-CM

## 2021-12-29 DIAGNOSIS — Z419 Encounter for procedure for purposes other than remedying health state, unspecified: Secondary | ICD-10-CM | POA: Diagnosis not present

## 2022-01-02 ENCOUNTER — Ambulatory Visit: Payer: BC Managed Care – PPO | Admitting: Podiatry

## 2022-01-15 ENCOUNTER — Encounter: Payer: BC Managed Care – PPO | Admitting: Internal Medicine

## 2022-01-16 ENCOUNTER — Telehealth: Payer: Self-pay | Admitting: *Deleted

## 2022-01-16 NOTE — Telephone Encounter (Signed)
Pt scheduled appt with Dr Allena Katz 01/30/22 to pic up orthotics

## 2022-01-16 NOTE — Telephone Encounter (Signed)
I attempted to call the patient.  I left her a message, asking her to call and schedule an appointment to pick up her orthotics.

## 2022-01-25 ENCOUNTER — Other Ambulatory Visit: Payer: Self-pay | Admitting: Psychiatry

## 2022-01-25 DIAGNOSIS — F410 Panic disorder [episodic paroxysmal anxiety] without agoraphobia: Secondary | ICD-10-CM

## 2022-01-26 ENCOUNTER — Other Ambulatory Visit: Payer: Self-pay | Admitting: Psychiatry

## 2022-01-26 DIAGNOSIS — F3176 Bipolar disorder, in full remission, most recent episode depressed: Secondary | ICD-10-CM

## 2022-01-26 DIAGNOSIS — F410 Panic disorder [episodic paroxysmal anxiety] without agoraphobia: Secondary | ICD-10-CM

## 2022-01-29 DIAGNOSIS — Z419 Encounter for procedure for purposes other than remedying health state, unspecified: Secondary | ICD-10-CM | POA: Diagnosis not present

## 2022-01-30 ENCOUNTER — Other Ambulatory Visit: Payer: Self-pay | Admitting: Psychiatry

## 2022-01-30 ENCOUNTER — Ambulatory Visit: Payer: Medicaid Other | Admitting: Podiatry

## 2022-01-30 DIAGNOSIS — F410 Panic disorder [episodic paroxysmal anxiety] without agoraphobia: Secondary | ICD-10-CM

## 2022-01-30 DIAGNOSIS — F3176 Bipolar disorder, in full remission, most recent episode depressed: Secondary | ICD-10-CM

## 2022-01-30 NOTE — Telephone Encounter (Signed)
Completed already. 

## 2022-02-01 ENCOUNTER — Ambulatory Visit (INDEPENDENT_AMBULATORY_CARE_PROVIDER_SITE_OTHER): Payer: Medicaid Other | Admitting: Podiatry

## 2022-02-01 VITALS — BP 118/68

## 2022-02-01 DIAGNOSIS — Q666 Other congenital valgus deformities of feet: Secondary | ICD-10-CM

## 2022-02-01 DIAGNOSIS — M7662 Achilles tendinitis, left leg: Secondary | ICD-10-CM

## 2022-02-01 NOTE — Progress Notes (Signed)
Orthotics were picked and dispensed.  They are functioning and fitting well.  If any acute complaints patient come back and see me.

## 2022-02-07 ENCOUNTER — Encounter: Payer: Medicaid Other | Admitting: Internal Medicine

## 2022-02-22 ENCOUNTER — Other Ambulatory Visit: Payer: Self-pay | Admitting: Psychiatry

## 2022-02-22 ENCOUNTER — Other Ambulatory Visit: Payer: Self-pay | Admitting: Internal Medicine

## 2022-02-22 DIAGNOSIS — F3176 Bipolar disorder, in full remission, most recent episode depressed: Secondary | ICD-10-CM

## 2022-02-22 NOTE — Telephone Encounter (Signed)
Requested medication (s) are due for refill today: yes  Requested medication (s) are on the active medication list: yes  Last refill:  08/03/21  Future visit scheduled: yes  Notes to clinic:  historical med. Please review for refill.     Requested Prescriptions  Pending Prescriptions Disp Refills   atorvastatin (LIPITOR) 10 MG tablet [Pharmacy Med Name: Atorvastatin Calcium 10 MG Oral Tablet] 90 tablet 0    Sig: Take 1 tablet by mouth once daily     Cardiovascular:  Antilipid - Statins Failed - 02/22/2022  8:37 AM      Failed - Lipid Panel in normal range within the last 12 months    Cholesterol, Total  Date Value Ref Range Status  06/30/2015 194 100 - 199 mg/dL Final   Cholesterol  Date Value Ref Range Status  07/14/2021 199 <200 mg/dL Final   LDL Cholesterol (Calc)  Date Value Ref Range Status  07/14/2021 117 (H) mg/dL (calc) Final    Comment:    Reference range: <100 . Desirable range <100 mg/dL for primary prevention;   <70 mg/dL for patients with CHD or diabetic patients  with > or = 2 CHD risk factors. Marland Kitchen LDL-C is now calculated using the Martin-Hopkins  calculation, which is a validated novel method providing  better accuracy than the Friedewald equation in the  estimation of LDL-C.  Cresenciano Genre et al. Annamaria Helling. 7510;258(52): 2061-2068  (http://education.[employer].com/faq/FAQ164)    Direct LDL  Date Value Ref Range Status  08/27/2019 144.0 mg/dL Final    Comment:    Optimal:  <100 mg/dLNear or Above Optimal:  100-129 mg/dLBorderline High:  130-159 mg/dLHigh:  160-189 mg/dLVery High:  >190 mg/dL   HDL  Date Value Ref Range Status  07/14/2021 46 (L) > OR = 50 mg/dL Final  06/30/2015 57 >39 mg/dL Final   Triglycerides  Date Value Ref Range Status  07/14/2021 251 (H) <150 mg/dL Final    Comment:    . If a non-fasting specimen was collected, consider repeat triglyceride testing on a fasting specimen if clinically indicated.  Yates Decamp et al. J. of Clin.  Lipidol. 7782;4:235-361. Marland Kitchen          Passed - Patient is not pregnant      Passed - Valid encounter within last 12 months    Recent Outpatient Visits           7 months ago [facility] [place], [place], NP   11 months ago Upper respiratory infection with cough and congestion   [facility] [place], [place]ie Ke[place], NP   1 year ago Encounter for general adult medical examination with abnormal findings   [facility] [place], [place]alie Keens, NP       Future Appointments             In 4 weeks Garnette Gunner, [place]alie Keens, NP [facility], [facility]

## 2022-03-01 DIAGNOSIS — Z419 Encounter for procedure for purposes other than remedying health state, unspecified: Secondary | ICD-10-CM | POA: Diagnosis not present

## 2022-03-06 ENCOUNTER — Telehealth: Payer: Medicaid Other | Admitting: Psychiatry

## 2022-03-13 ENCOUNTER — Ambulatory Visit: Payer: 59 | Admitting: Internal Medicine

## 2022-03-13 ENCOUNTER — Encounter: Payer: Self-pay | Admitting: Internal Medicine

## 2022-03-13 ENCOUNTER — Ambulatory Visit (INDEPENDENT_AMBULATORY_CARE_PROVIDER_SITE_OTHER): Payer: 59 | Admitting: Internal Medicine

## 2022-03-13 VITALS — BP 106/70 | HR 101 | Temp 96.9°F | Ht 63.0 in | Wt 281.0 lb

## 2022-03-13 DIAGNOSIS — Z0001 Encounter for general adult medical examination with abnormal findings: Secondary | ICD-10-CM | POA: Diagnosis not present

## 2022-03-13 DIAGNOSIS — Z6841 Body Mass Index (BMI) 40.0 and over, adult: Secondary | ICD-10-CM

## 2022-03-13 DIAGNOSIS — R7309 Other abnormal glucose: Secondary | ICD-10-CM | POA: Diagnosis not present

## 2022-03-13 NOTE — Progress Notes (Signed)
Subjective:    Patient ID: Jill Sandoval, female    DOB: Mar 10, 1990, 32 y.o.   MRN: [id number]  HPI  Patient presents to clinic today for her annual exam.  She also reports left ear fullness.  This started 3 weeks ago after she yawned. She heard her ear pop at that time. Since that time, she reports it feels muffled, echoes and hears a pulsating sensation. She does feel like she has noticed clear drainage from that ear. She denies runny nose, nasal congestion, or sore throat.  Flu: 10/2018 Tetanus: 07/2016 COVID: Pfizer x 1 Pap smear: 07/2020 Dentist: biannually  Diet: She does eat meat. She consumes some fruits and veggies. She does eat some fried foods. She drinks mostly tea or caffeine free soda. Exercise: None  Review of Systems     Past Medical History:  Diagnosis Date   Anxiety and depression    Asthma    inhaler prn   Fetal demise > 22 weeks, delivered, current hospitalization 11/04/2015   Miscarriage     Current Outpatient Medications  Medication Sig Dispense Refill   atorvastatin (LIPITOR) 10 MG tablet Take 1 tablet by mouth once daily 90 tablet 0   busPIRone (BUSPAR) 10 MG tablet Take 1 tablet by mouth twice daily 180 tablet 0   hydrOXYzine (VISTARIL) 25 MG capsule Take 1-2 capsules (25-50 mg total) by mouth as directed. Take for severe panic attacks 60 capsule 1   Insulin Pen Needle 31G X 5 MM MISC BD Pen Needles- brand specific Inject insulin via insulin pen 6 x daily 90 each 1   nortriptyline (PAMELOR) 75 MG capsule Take 1 capsule by mouth at bedtime 90 capsule 0   Oxcarbazepine (TRILEPTAL) 300 MG tablet TAKE 3 TABLETS BY MOUTH TWICE DAILY IN THE MORNING AND IN THE EVENING 540 tablet 0   risperiDONE (RISPERDAL) 0.5 MG tablet TAKE 1 TABLET BY MOUTH AT BEDTIME 90 tablet 0   SAXENDA 18 MG/3ML SOPN Inject 3 mg into the skin daily. Injection 0.6 mg into skin once daily for 1 week, as tolerated increase by increment of 0.68m every 1 week, max dose is 317minjection daily  after 5 weeks. 15 mL 0   No current facility-administered medications for this visit.    No Known Allergies  Family History  Problem Relation Age of Onset   Hypertension Maternal Grandmother    Prostate cancer Maternal Grandfather    Hearing loss Brother    Hearing loss Paternal Grandfather    Hypertension Father    Other Neg Hx     Social History   Socioeconomic History   Marital status: Married    Spouse name: Not on file   Number of children: 2   Years of education: Not on file   Highest education level: Not on file  Occupational History   Not on file  Tobacco Use   Smoking status: Former    Packs/day: 0.25    Types: Cigarettes    Quit date: 11/14/2018    Years since quitting: 3.3   Smokeless tobacco: Never  Vaping Use   Vaping Use: Some days  Substance and Sexual Activity   Alcohol use: No   Drug use: No   Sexual activity: Yes    Birth control/protection: None  Other Topics Concern   Not on file  Social History Narrative   Married.   2 children.   Works at [employer]  Enjoys camping, being outdoors.    Social Determinants of Health  Financial Resource Strain: Not on file  Food Insecurity: Not on file  Transportation Needs: Not on file  Physical Activity: Not on file  Stress: Not on file  Social Connections: Not on file  Intimate Partner Violence: Not on file     Constitutional: Patient reports intermittent headaches.  Denies fever, malaise, fatigue, or abrupt weight changes.  HEENT: Patient reports left ear fullness.  Denies eye pain, eye redness, ear pain, ringing in the ears, wax buildup, runny nose, nasal congestion, bloody nose, or sore throat. Respiratory: Denies difficulty breathing, shortness of breath, cough or sputum production.   Cardiovascular: Denies chest pain, chest tightness, palpitations or swelling in the hands or feet.  Gastrointestinal: Denies abdominal pain, bloating, constipation, diarrhea or blood in the stool.  GU: Denies  urgency, frequency, pain with urination, burning sensation, blood in urine, odor or discharge. Musculoskeletal: Denies decrease in range of motion, difficulty with gait, muscle pain or joint pain and swelling.  Skin: Denies redness, rashes, lesions or ulcercations.  Neurological: Patient reports insomnia.  Denies dizziness, difficulty with memory, difficulty with speech or problems with balance and coordination.  Psych: Patient has a history of anxiety and depression.  Denies SI/HI.  No other specific complaints in a complete review of systems (except as listed in HPI above).  Objective:   Physical Exam BP 106/70 (BP Location: Right Arm, Patient Position: Sitting, Cuff Size: Large)   Pulse (!) 101   Temp (!) 96.9 F (36.1 C) (Temporal)   Ht 5' 3"$  (1.6 m)   Wt 281 lb (127.5 kg)   SpO2 95%   BMI 49.78 kg/m   Wt Readings from Last 3 Encounters:  07/14/21 273 lb (123.8 kg)  09/15/20 257 lb 3.2 oz (116.7 kg)  04/27/20 246 lb (111.6 kg)    General: Appears her stated age, obese in NAD. Skin: Warm, dry and intact. No rashes, lesions or ulcerations noted. HEENT: Head: normal shape and size; Eyes: sclera white, no icterus, conjunctiva pink, PERRLA and EOMs intact; Ears: Tm's gray and intact, normal light reflex;  Neck:  Neck supple, trachea midline. No masses, lumps or thyromegaly present.  Cardiovascular: Tachycardic with normal rhythm. S1,S2 noted.  No murmur, rubs or gallops noted. No JVD or BLE edema. No carotid bruits noted. Pulmonary/Chest: Normal effort and positive vesicular breath sounds. No respiratory distress. No wheezes, rales or ronchi noted.  Abdomen:  Normal bowel sounds.  Musculoskeletal: Strength 5/5 BUE/BLE.  No difficulty with gait.  Neurological: Alert and oriented. Cranial nerves II-XII grossly intact. Coordination normal.  Psychiatric: Mood and affect normal. Behavior is normal. Judgment and thought content normal.    BMET    Component Value Date/Time   NA 138  07/14/2021 1337   NA 136 06/30/2015 0000   NA 137 07/28/2011 1558   K 4.3 07/14/2021 1337   K 3.7 07/28/2011 1558   CL 103 07/14/2021 1337   CL 107 07/28/2011 1558   CO2 24 07/14/2021 1337   CO2 22 07/28/2011 1558   GLUCOSE 100 07/14/2021 1337   GLUCOSE 76 07/28/2011 1558   BUN 15 07/14/2021 1337   BUN 9 06/30/2015 0000   BUN 11 07/28/2011 1558   CREATININE 0.86 07/14/2021 1337   CALCIUM 9.6 07/14/2021 1337   CALCIUM 9.1 07/28/2011 1558   GFRNONAA >60 06/09/2020 0946   GFRNONAA >60 07/28/2011 1558   GFRAA 136 06/30/2015 0000   GFRAA >60 07/28/2011 1558    Lipid Panel     Component Value Date/Time   CHOL  199 07/14/2021 1337   CHOL 194 06/30/2015 0000   TRIG 251 (H) 07/14/2021 1337   HDL 46 (L) 07/14/2021 1337   HDL 57 06/30/2015 0000   CHOLHDL 4.3 07/14/2021 1337   VLDL 20 06/14/2020 0927   LDLCALC 117 (H) 07/14/2021 1337    CBC    Component Value Date/Time   WBC 8.1 09/15/2020 0955   RBC 4.53 09/15/2020 0955   HGB 12.9 09/15/2020 0955   HGB 12.6 06/30/2015 0000   HCT 39.3 09/15/2020 0955   HCT 38.1 06/30/2015 0000   PLT 286 09/15/2020 0955   PLT 320 06/30/2015 0000   MCV 86.8 09/15/2020 0955   MCV 84 06/30/2015 0000   MCV 89 07/28/2011 1558   MCH 28.5 09/15/2020 0955   MCHC 32.8 09/15/2020 0955   RDW 12.8 09/15/2020 0955   RDW 13.9 06/30/2015 0000   RDW 13.3 07/28/2011 1558   LYMPHSABS 2.2 06/09/2020 0946   LYMPHSABS 2.4 06/30/2015 0000   MONOABS 0.4 06/09/2020 0946   EOSABS 0.3 06/09/2020 0946   EOSABS 0.2 06/30/2015 0000   BASOSABS 0.0 06/09/2020 0946   BASOSABS 0.0 06/30/2015 0000    Hgb A1C Lab Results  Component Value Date   HGBA1C 5.0 07/14/2021            Assessment & Plan:   Preventative Health Maintenance:  Encouraged her to get a flu shot in the fall Tetanus UTD Encouraged her to get her COVID booster Pap smear UTD Encouraged her to consume a balanced diet and exercise regimen Advised her to see a dentist annually We will  check CBC, c-Met, lipid, A1c today  RTC in 6 months, follow-up chronic conditions Webb Silversmith, NP

## 2022-03-13 NOTE — Patient Instructions (Signed)

## 2022-03-13 NOTE — Assessment & Plan Note (Addendum)
Encourage diet and exercise for weight loss 

## 2022-03-14 LAB — CBC
HCT: 38.5 % (ref 35.0–45.0)
Hemoglobin: 12.9 g/dL (ref 11.7–15.5)
MCH: 28.6 pg (ref 27.0–33.0)
MCHC: 33.5 g/dL (ref 32.0–36.0)
MCV: 85.4 fL (ref 80.0–100.0)
MPV: 10.5 fL (ref 7.5–12.5)
Platelets: 266 10*3/uL (ref 140–400)
RBC: 4.51 10*6/uL (ref 3.80–5.10)
RDW: 12.8 % (ref 11.0–15.0)
WBC: 6 10*3/uL (ref 3.8–10.8)

## 2022-03-14 LAB — COMPLETE METABOLIC PANEL WITH GFR
AG Ratio: 2.1 (calc) (ref 1.0–2.5)
ALT: 18 U/L (ref 6–29)
AST: 12 U/L (ref 10–30)
Albumin: 4.6 g/dL (ref 3.6–5.1)
Alkaline phosphatase (APISO): 106 U/L (ref 31–125)
BUN: 14 mg/dL (ref 7–25)
CO2: 24 mmol/L (ref 20–32)
Calcium: 9.3 mg/dL (ref 8.6–10.2)
Chloride: 105 mmol/L (ref 98–110)
Creat: 0.95 mg/dL (ref 0.50–0.97)
Globulin: 2.2 g/dL (calc) (ref 1.9–3.7)
Glucose, Bld: 90 mg/dL (ref 65–99)
Potassium: 4.3 mmol/L (ref 3.5–5.3)
Sodium: 141 mmol/L (ref 135–146)
Total Bilirubin: 0.2 mg/dL (ref 0.2–1.2)
Total Protein: 6.8 g/dL (ref 6.1–8.1)
eGFR: 82 mL/min/{1.73_m2} (ref >=60)

## 2022-03-14 LAB — LIPID PANEL
Cholesterol: 158 mg/dL (ref <200)
HDL: 55 mg/dL (ref >=50)
LDL Cholesterol (Calc): 83 mg/dL (calc)
Non-HDL Cholesterol (Calc): 103 mg/dL (calc) (ref <130)
Total CHOL/HDL Ratio: 2.9 (calc) (ref <5.0)
Triglycerides: 100 mg/dL (ref <150)

## 2022-03-14 LAB — HEMOGLOBIN A1C
Hgb A1c MFr Bld: 5.3 % of total Hgb (ref <5.7)
Mean Plasma Glucose: 105 mg/dL
eAG (mmol/L): 5.8 mmol/L

## 2022-03-19 ENCOUNTER — Encounter: Payer: Self-pay | Admitting: Psychiatry

## 2022-03-19 ENCOUNTER — Telehealth (INDEPENDENT_AMBULATORY_CARE_PROVIDER_SITE_OTHER): Payer: Medicaid Other | Admitting: Psychiatry

## 2022-03-19 DIAGNOSIS — Z79899 Other long term (current) drug therapy: Secondary | ICD-10-CM

## 2022-03-19 DIAGNOSIS — F3176 Bipolar disorder, in full remission, most recent episode depressed: Secondary | ICD-10-CM | POA: Diagnosis not present

## 2022-03-19 DIAGNOSIS — G4701 Insomnia due to medical condition: Secondary | ICD-10-CM

## 2022-03-19 DIAGNOSIS — F410 Panic disorder [episodic paroxysmal anxiety] without agoraphobia: Secondary | ICD-10-CM | POA: Diagnosis not present

## 2022-03-19 MED ORDER — OXCARBAZEPINE 300 MG PO TABS: 900.0000 mg | ORAL_TABLET | Freq: Two times a day (BID) | ORAL | 0 refills | Status: DC

## 2022-03-19 MED ORDER — RISPERIDONE 0.5 MG PO TABS: 0.2500 mg | ORAL_TABLET | Freq: Every day | ORAL | 0 refills | Status: DC

## 2022-03-19 NOTE — Progress Notes (Signed)
Virtual Visit via Video Note  I connected with Jill Sandoval on 03/19/22 at  9:30 AM EST by a video enabled telemedicine application and verified that I am speaking with the correct person using two identifiers.  Location Provider Location : [facility] Patient Location : Home  Participants: Patient , Provider    I discussed the limitations of evaluation and management by telemedicine and the availability of in person appointments. The patient expressed understanding and agreed to proceed.  I discussed the assessment and treatment plan with the patient. The patient was provided an opportunity to ask questions and all were answered. The patient agreed with the plan and demonstrated an understanding of the instructions.   The patient was advised to call back or seek an in-person evaluation if the symptoms worsen or if the condition fails to improve as anticipated.    [place] MD OP Progress Note  03/19/2022 9:48 AM Jill Sandoval  MRN:  [id number]  Chief Complaint:  Chief Complaint  Patient presents with   Follow-up   Medication Refill   Anxiety   HPI: Jill Sandoval is a 32 year old Caucasian female, married, currently lives in [place], has a history of bipolar disorder type II, panic disorder, insomnia was evaluated by telemedicine today.  Patient today reports overall mood symptoms are stable.  Denies any hypomanic, manic symptoms.  Denies any depression symptoms.  May have had 1 panic attack since her last visit, however she was able to cope using her coping strategies.  Reports sleep is good.  She feels rested when she wakes up in the morning.  She reports she is compliant on the risperidone, Trileptal, nortriptyline and BuSpar.  Denies side effects.  Agreeable to tapering down risperidone, patient is currently on polypharmacy.  Denies any suicidality, homicidality or perceptual disturbances.  Reviewed and discussed most recent labs including CMP, CBC with differential, lipid  panel, hemoglobin A1c with patient.  Patient denies any other concerns today.  Visit Diagnosis:    ICD-10-CM   1. Bipolar disorder, in full remission, most recent episode depressed (Jill Sandoval)  F31.76 risperiDONE (RISPERDAL) 0.5 MG tablet    2. Panic disorder  F41.0 Oxcarbazepine (TRILEPTAL) 300 MG tablet    3. Insomnia due to medical condition  G47.01    Anxiety    4. High risk medication use  Z79.899       Past Psychiatric History: Reviewed past psychiatric history from progress note on 10/15/2018.  Past trials of Celexa, Trileptal.  Past Medical History:  Past Medical History:  Diagnosis Date   Anxiety and depression    Asthma    inhaler prn   Fetal demise > 22 weeks, delivered, current hospitalization 11/04/2015   Miscarriage     Past Surgical History:  Procedure Laterality Date   CESAREAN SECTION N/A 03/24/2012   Procedure: CESAREAN SECTION;  Surgeon: Sharene Butters, MD;  Location: [facility];  Service: Obstetrics;  Laterality: N/A;   CESAREAN SECTION N/A 09/24/2016   Procedure: CESAREAN SECTION;  Surgeon: Vanessa Kick, MD;  Location: [facility];  Service: Obstetrics;  Laterality: N/A;    Family Psychiatric History: Reviewed family psychiatric history from progress note on 10/15/2018.  Family History:  Family History  Problem Relation Age of Onset   Healthy Mother    Hypertension Father    Hearing loss Brother    Hypertension Maternal Grandmother    Prostate cancer Maternal Grandfather    Hearing loss Paternal Grandfather    Other Neg Hx  Social History: Reviewed social history from progress note on 10/15/2018. Social History   Socioeconomic History   Marital status: Married    Spouse name: Not on file   Number of children: 2   Years of education: Not on file   Highest education level: Not on file  Occupational History   Not on file  Tobacco Use   Smoking status: Former    Packs/day: 0.25    Types: Cigarettes    Quit date: 11/14/2018    Years since  quitting: 3.3   Smokeless tobacco: Never  Vaping Use   Vaping Use: Some days  Substance and Sexual Activity   Alcohol use: No   Drug use: No   Sexual activity: Yes    Birth control/protection: None  Other Topics Concern   Not on file  Social History Narrative   Married.   2 children.   Works at [employer].   Enjoys camping, being outdoors.    Social Determinants of Health   Financial Resource Strain: Not on file  Food Insecurity: Not on file  Transportation Needs: Not on file  Physical Activity: Not on file  Stress: Not on file  Social Connections: Not on file    Allergies: No Known Allergies  Metabolic Disorder Labs: Lab Results  Component Value Date   HGBA1C 5.3 03/13/2022   MPG 105 03/13/2022   MPG 97 07/14/2021   No results found for: "PROLACTIN" Lab Results  Component Value Date   CHOL 158 03/13/2022   TRIG 100 03/13/2022   HDL 55 03/13/2022   CHOLHDL 2.9 03/13/2022   VLDL 20 06/14/2020   LDLCALC 83 03/13/2022   LDLCALC 117 (H) 07/14/2021   Lab Results  Component Value Date   TSH 1.34 04/27/2020   TSH 1.42 07/08/2019    Therapeutic Level Labs: No results found for: "LITHIUM" No results found for: "VALPROATE" No results found for: "CBMZ"  Current Medications: Current Outpatient Medications  Medication Sig Dispense Refill   atorvastatin (LIPITOR) 10 MG tablet Take 1 tablet by mouth once daily 90 tablet 0   busPIRone (BUSPAR) 10 MG tablet Take 1 tablet by mouth twice daily 180 tablet 0   hydrOXYzine (VISTARIL) 25 MG capsule Take 1-2 capsules (25-50 mg total) by mouth as directed. Take for severe panic attacks 60 capsule 1   nortriptyline (PAMELOR) 75 MG capsule Take 1 capsule by mouth at bedtime 90 capsule 0   Oxcarbazepine (TRILEPTAL) 300 MG tablet Take 3 tablets (900 mg total) by mouth 2 (two) times daily. 540 tablet 0   risperiDONE (RISPERDAL) 0.5 MG tablet Take 0.5 tablets (0.25 mg total) by mouth at bedtime. 90 tablet 0   No current  facility-administered medications for this visit.     Musculoskeletal: Strength & Muscle Tone:  UTA Gait & Station:  Seated Patient leans: N/A  Psychiatric Specialty Exam: Review of Systems  Psychiatric/Behavioral: Negative.    All other systems reviewed and are negative.   There were no vitals taken for this visit.There is no height or weight on file to calculate BMI.  General Appearance: Casual  Eye Contact:  Fair  Speech:  Clear and Coherent  Volume:  Normal  Mood:  Euthymic  Affect:  Appropriate  Thought Process:  Goal Directed and Descriptions of Associations: Intact  Orientation:  Full (Time, Place, and Person)  Thought Content: Logical   Suicidal Thoughts:  No  Homicidal Thoughts:  No  Memory:  Immediate;   Fair Recent;   Fair Remote;   Fair  Judgement:  Fair  Insight:  Fair  Psychomotor Activity:  Normal  Concentration:  Concentration: Good and Attention Span: Fair  Recall:  [employer] of Knowledge: Fair  Language: Fair  Akathisia:  No  Handed:  Right  AIMS (if indicated): not done  Assets:  Communication Skills Desire for Improvement Housing Intimacy Social Support Transportation  ADL's:  Intact  Cognition: WNL  Sleep:  Fair   Screenings: AIMS    Flowsheet Row Video Visit from 11/15/2021 in [facility] Office Visit from 08/03/2021 in [facility] Video Visit from 05/19/2021 in [facility] Video Visit from 02/17/2021 in [facility] Total Score 0 0 0 [place] Office Visit from 03/13/2022 in [facility] Video Visit from 11/15/2021 in [facility] Office Visit from 07/14/2021 in [facility] Video Visit from 02/17/2021 in [facility] Video  Visit from 03/29/2020 in [facility]  Total GAD-7 Score 0 0 1 0 18      PHQ2-9    Flowsheet Row Video Visit from 03/19/2022 in [facility] Office Visit from 03/13/2022 in [facility] Video Visit from 11/15/2021 in [facility] Office Visit from 08/03/2021 in [facility] Office Visit from 07/14/2021 in [facility] [place]  PHQ-2 Total Score 0 0 0 0 1  PHQ-9 Total Score -- -- 0 1 7      Flowsheet Row Video Visit from 03/19/2022 in [facility] Video Visit from 11/15/2021 in [facility] Office Visit from 08/03/2021 in [facility] No Risk No Risk No Risk        Assessment and Plan: Jill Sandoval is a 32 year old Caucasian female, married, lives in [place], has a history of bipolar disorder type II, panic attack was evaluated at by telemedicine today.  Patient is currently stable, agreeable to tapering down risperidone it being an atypical antipsychotic with long-term adverse side effects of patient being on polypharmacy.  Plan as noted below.  Plan Bipolar disorder type II in remission Reduce risperidone to 0.25 mg p.o. daily. Trileptal 900 mg p.o. twice daily  Panic disorder-stable BuSpar 10 mg p.o. twice daily Nortriptyline's 25 mg daily at bedtime Hydroxyzine 25-50 mg p.o. daily as needed  Insomnia-stable Nortriptyline 75 mg p.o. nightly Melatonin 3 to 5 mg at bedtime as needed Hydroxyzine 25-50 mg at bedtime as needed   High risk medication use-reviewed and discussed labs-hemoglobin A1c-03/13/22-5.3-within normal limits, lipid panel-within normal limits, platelet count-within normal limits, CMP-within normal limits including sodium and  LFT.  Follow-up in clinic in 6 weeks or sooner if needed.   Consent: Patient/Guardian gives verbal consent for treatment and assignment of benefits for services provided during this visit. Patient/Guardian expressed understanding and agreed to proceed.   This note was generated in part or whole with voice recognition software. Voice recognition is usually quite accurate but there are transcription errors that can and very often do occur. I apologize for any typographical errors that were not detected and corrected.      Ursula Alert, MD 03/19/2022, 9:48 AM

## 2022-03-22 ENCOUNTER — Encounter: Payer: Medicaid Other | Admitting: Internal Medicine

## 2022-03-29 ENCOUNTER — Encounter: Payer: Self-pay | Admitting: Internal Medicine

## 2022-03-30 DIAGNOSIS — Z419 Encounter for procedure for purposes other than remedying health state, unspecified: Secondary | ICD-10-CM | POA: Diagnosis not present

## 2022-04-26 ENCOUNTER — Telehealth (INDEPENDENT_AMBULATORY_CARE_PROVIDER_SITE_OTHER): Payer: Medicaid Other | Admitting: Psychiatry

## 2022-04-26 ENCOUNTER — Encounter: Payer: Self-pay | Admitting: Psychiatry

## 2022-04-26 DIAGNOSIS — F410 Panic disorder [episodic paroxysmal anxiety] without agoraphobia: Secondary | ICD-10-CM | POA: Diagnosis not present

## 2022-04-26 DIAGNOSIS — G4701 Insomnia due to medical condition: Secondary | ICD-10-CM

## 2022-04-26 DIAGNOSIS — F3176 Bipolar disorder, in full remission, most recent episode depressed: Secondary | ICD-10-CM

## 2022-04-26 MED ORDER — BUSPIRONE HCL 10 MG PO TABS: 10.0000 mg | ORAL_TABLET | Freq: Two times a day (BID) | ORAL | 0 refills | Status: DC

## 2022-04-26 MED ORDER — NORTRIPTYLINE HCL 75 MG PO CAPS: 75.0000 mg | ORAL_CAPSULE | Freq: Every day | ORAL | 0 refills | Status: DC

## 2022-04-26 NOTE — Progress Notes (Signed)
Virtual Visit via Video Note  I connected with Jill Sandoval on 04/26/22 at  9:00 AM EDT by a video enabled telemedicine application and verified that I am speaking with the correct person using two identifiers.  Location Provider Location : Remote Office  Patient Location : Home  Participants: Patient , Provider   I discussed the limitations of evaluation and management by telemedicine and the availability of in person appointments. The patient expressed understanding and agreed to proceed.   I discussed the assessment and treatment plan with the patient. The patient was provided an opportunity to ask questions and all were answered. The patient agreed with the plan and demonstrated an understanding of the instructions.   The patient was advised to call back or seek an in-person evaluation if the symptoms worsen or if the condition fails to improve as anticipated.    [place] MD OP Progress Note  04/26/2022 9:20 AM Jill Sandoval  MRN:  [id number]  Chief Complaint:  Chief Complaint  Patient presents with   Follow-up   Medication Refill   Anxiety   HPI: Jill Sandoval is a 32 year old Caucasian female, married, currently lives in [place], has a history of bipolar disorder type II was evaluated by telemedicine today.  Patient today reports she is doing well on the lower dose of risperidone.  Since being on the lower dosage she reports she has been sleeping better than before.  She has not had any depression, hypomanic symptoms.  Reports anxiety symptoms are manageable.  Patient is currently compliant on all her medications including Trileptal, nortriptyline, BuSpar.  Denies side effects.  Patient denies any suicidality, homicidality or perceptual disturbances.  Patient denies any other concerns today.  Visit Diagnosis:    ICD-10-CM   1. Bipolar disorder, in full remission, most recent episode depressed ([place])  F31.76 nortriptyline (PAMELOR) 75 MG capsule    2. Panic disorder   F41.0 busPIRone (BUSPAR) 10 MG tablet    nortriptyline (PAMELOR) 75 MG capsule    3. Insomnia due to medical condition  G47.01    Anxiety      Past Psychiatric History: Reviewed past psychiatric history from progress note on 10/15/2018.  Past trials of Celexa, Trileptal.  Past Medical History:  Past Medical History:  Diagnosis Date   Anxiety and depression    Asthma    inhaler prn   Fetal demise > 22 weeks, delivered, current hospitalization 11/04/2015   Miscarriage     Past Surgical History:  Procedure Laterality Date   CESAREAN SECTION N/A 03/24/2012   Procedure: CESAREAN SECTION;  Surgeon: Sharene Butters, MD;  Location: [facility];  Service: Obstetrics;  Laterality: N/A;   CESAREAN SECTION N/A 09/24/2016   Procedure: CESAREAN SECTION;  Surgeon: Vanessa Kick, MD;  Location: [facility];  Service: Obstetrics;  Laterality: N/A;    Family Psychiatric History: Reviewed family psychiatric history from progress note on 10/15/2018.  Family History:  Family History  Problem Relation Age of Onset   Healthy Mother    Hypertension Father    Hearing loss Brother    Hypertension Maternal Grandmother    Prostate cancer Maternal Grandfather    Hearing loss Paternal Grandfather    Other Neg Hx     Social History: Reviewed social history from progress note on 10/15/2018. Social History   Socioeconomic History   Marital status: Married    Spouse name: Not on file   Number of children: 2   Years of education: Not on file  Highest education level: Not on file  Occupational History   Not on file  Tobacco Use   Smoking status: Former    Packs/day: .25    Types: Cigarettes    Quit date: 11/14/2018    Years since quitting: 3.4   Smokeless tobacco: Never  Vaping Use   Vaping Use: Some days  Substance and Sexual Activity   Alcohol use: No   Drug use: No   Sexual activity: Yes    Birth control/protection: None  Other Topics Concern   Not on file  Social History Narrative    Married.   2 children.   Works at [employer].   Enjoys camping, being outdoors.    Social Determinants of Health   Financial Resource Strain: Not on file  Food Insecurity: Not on file  Transportation Needs: Not on file  Physical Activity: Not on file  Stress: Not on file  Social Connections: Not on file    Allergies: No Known Allergies  Metabolic Disorder Labs: Lab Results  Component Value Date   HGBA1C 5.3 03/13/2022   MPG 105 03/13/2022   MPG 97 07/14/2021   No results found for: "PROLACTIN" Lab Results  Component Value Date   CHOL 158 03/13/2022   TRIG 100 03/13/2022   HDL 55 03/13/2022   CHOLHDL 2.9 03/13/2022   VLDL 20 06/14/2020   LDLCALC 83 03/13/2022   LDLCALC 117 (H) 07/14/2021   Lab Results  Component Value Date   TSH 1.34 04/27/2020   TSH 1.42 07/08/2019    Therapeutic Level Labs: No results found for: "LITHIUM" No results found for: "VALPROATE" No results found for: "CBMZ"  Current Medications: Current Outpatient Medications  Medication Sig Dispense Refill   atorvastatin (LIPITOR) 10 MG tablet Take 1 tablet by mouth once daily 90 tablet 0   hydrOXYzine (VISTARIL) 25 MG capsule Take 1-2 capsules (25-50 mg total) by mouth as directed. Take for severe panic attacks 60 capsule 1   Oxcarbazepine (TRILEPTAL) 300 MG tablet Take 3 tablets (900 mg total) by mouth 2 (two) times daily. 540 tablet 0   risperiDONE (RISPERDAL) 0.5 MG tablet Take 0.5 tablets (0.25 mg total) by mouth at bedtime. 90 tablet 0   YAZ 3-0.02 MG tablet Take 1 tablet by mouth daily.     busPIRone (BUSPAR) 10 MG tablet Take 1 tablet (10 mg total) by mouth 2 (two) times daily. 180 tablet 0   nortriptyline (PAMELOR) 75 MG capsule Take 1 capsule (75 mg total) by mouth at bedtime. 90 capsule 0   No current facility-administered medications for this visit.     Musculoskeletal: Strength & Muscle Tone:  UTA Gait & Station:  Seated Patient leans: N/A  Psychiatric Specialty Exam: Review  of Systems  Psychiatric/Behavioral: Negative.    All other systems reviewed and are negative.   There were no vitals taken for this visit.There is no height or weight on file to calculate BMI.  General Appearance: Casual  Eye Contact:  Fair  Speech:  Clear and Coherent  Volume:  Normal  Mood:  Euthymic  Affect:  Congruent  Thought Process:  Goal Directed and Descriptions of Associations: Intact  Orientation:  Full (Time, Place, and Person)  Thought Content: Logical   Suicidal Thoughts:  No  Homicidal Thoughts:  No  Memory:  Immediate;   Fair Recent;   Fair Remote;   Fair  Judgement:  Fair  Insight:  Fair  Psychomotor Activity:  Normal  Concentration:  Concentration: Fair and Attention Span: Fair  Recall:  Smiley Houseman of Knowledge: Fair  Language: Fair  Akathisia:  No  Handed:  Right  AIMS (if indicated): not done  Assets:  Communication Skills Desire for Improvement Housing Social Support  ADL's:  Intact  Cognition: WNL  Sleep:  Fair   Screenings: AIMS    Flowsheet Row Video Visit from 04/26/2022 in [facility] Video Visit from 11/15/2021 in [facility] [place] Office Visit from 08/03/2021 in [place] Video Visit from 05/19/2021 in [facility] Video Visit from 02/17/2021 in [facility] Total Score 0 0 0 0 0      GAD-7    Flowsheet Row Video Visit from 04/26/2022 in [place] Office Visit from 03/13/2022 in [facility] Video Visit from 11/15/2021 in [facility] Office Visit from 07/14/2021 in [facility] Video Visit from 02/17/2021 in [place]  Total GAD-7 Score 0 0 0 1 0      PHQ2-9     Flowsheet Row Video Visit from 04/26/2022 in [facility] Video Visit from 03/19/2022 in [place] [facility] Office Visit from 03/13/2022 in [facility] Video Visit from 11/15/2021 in [facility] Office Visit from 08/03/2021 in [place]  PHQ-2 Total Score 0 0 0 0 0  PHQ-9 Total Score 1 -- -- 0 1      Flowsheet Row Video Visit from 04/26/2022 in [place] Video Visit from 03/19/2022 in [place] Video Visit from 11/15/2021 in [facility] No Risk No Risk No Risk        Assessment and Plan: Jill Sandoval is a 32 year old Caucasian female, married, lives in [place], has a history of bipolar disorder type II, panic attack was evaluated by telemedicine today.  Patient is currently stable, tolerating the lower dosage of risperidone.  Agreeable to lowering the risperidone further.  Discussed plan as noted below.  Bipolar disorder type II in remission Patient advised she could reduce the risperidone further, could take 0.25 mg a few times a week and gradually cut back.  If she is tolerating it well then she could just use the risperidone 0.25 mg as needed.  Patient advised to monitor for worsening mood symptoms. Continue Trileptal 900 mg p.o. twice daily  Panic disorder-stable BuSpar 10 mg p.o. twice daily Nortriptyline 25 mg p.o. daily at bedtime Hydroxyzine 25-50 mg p.o. daily as needed  Insomnia-stable Nortriptyline 75 mg p.o. nightly Melatonin 3 to 5 mg at bedtime as needed Hydroxyzine 25-50 mg at bedtime as needed  Follow-up in clinic in 3 months or sooner if needed.    Consent: Patient/Guardian gives verbal consent for treatment and assignment of benefits for  services provided during this visit. Patient/Guardian expressed understanding and agreed to proceed.   This note was generated in part or whole with voice recognition software. Voice recognition is usually quite accurate but there are transcription errors that can and very often do occur. I apologize for any typographical errors that were not detected and corrected.    Ursula Alert, MD 04/26/2022, 9:20 AM

## 2022-04-30 DIAGNOSIS — Z419 Encounter for procedure for purposes other than remedying health state, unspecified: Secondary | ICD-10-CM | POA: Diagnosis not present

## 2022-05-30 DIAGNOSIS — Z419 Encounter for procedure for purposes other than remedying health state, unspecified: Secondary | ICD-10-CM | POA: Diagnosis not present

## 2022-06-29 ENCOUNTER — Encounter: Payer: Self-pay | Admitting: Internal Medicine

## 2022-06-30 DIAGNOSIS — Z419 Encounter for procedure for purposes other than remedying health state, unspecified: Secondary | ICD-10-CM | POA: Diagnosis not present

## 2022-07-05 MED ORDER — SEMAGLUTIDE-WEIGHT MANAGEMENT 0.25 MG/0.5ML ~~LOC~~ SOAJ: 0.2500 mg | SUBCUTANEOUS | 0 refills | Status: DC

## 2022-07-12 ENCOUNTER — Encounter: Payer: Self-pay | Admitting: Psychiatry

## 2022-07-12 ENCOUNTER — Telehealth (INDEPENDENT_AMBULATORY_CARE_PROVIDER_SITE_OTHER): Payer: Medicaid Other | Admitting: Psychiatry

## 2022-07-12 DIAGNOSIS — G4701 Insomnia due to medical condition: Secondary | ICD-10-CM

## 2022-07-12 DIAGNOSIS — F3176 Bipolar disorder, in full remission, most recent episode depressed: Secondary | ICD-10-CM | POA: Diagnosis not present

## 2022-07-12 DIAGNOSIS — F410 Panic disorder [episodic paroxysmal anxiety] without agoraphobia: Secondary | ICD-10-CM | POA: Diagnosis not present

## 2022-07-12 MED ORDER — OXCARBAZEPINE 300 MG PO TABS: 900.0000 mg | ORAL_TABLET | Freq: Two times a day (BID) | ORAL | 0 refills | Status: DC

## 2022-07-12 MED ORDER — NORTRIPTYLINE HCL 75 MG PO CAPS: 75.0000 mg | ORAL_CAPSULE | Freq: Every day | ORAL | 0 refills | Status: DC

## 2022-07-12 MED ORDER — BUSPIRONE HCL 10 MG PO TABS
10.0000 mg | ORAL_TABLET | Freq: Two times a day (BID) | ORAL | 0 refills | Status: DC
Start: 2022-07-12 — End: 2023-02-05

## 2022-07-12 NOTE — Progress Notes (Signed)
Virtual Visit via Video Note  I connected with Jill Sandoval on 07/12/22 at  4:20 PM EDT by a video enabled telemedicine application and verified that I am speaking with the correct person using two identifiers.  Location Provider Location : [facility] Patient Location : Home  Participants: Patient , Provider    I discussed the limitations of evaluation and management by telemedicine and the availability of in person appointments. The patient expressed understanding and agreed to proceed.   I discussed the assessment and treatment plan with the patient. The patient was provided an opportunity to ask questions and all were answered. The patient agreed with the plan and demonstrated an understanding of the instructions.   The patient was advised to call back or seek an in-person evaluation if the symptoms worsen or if the condition fails to improve as anticipated.   [facility] MD OP Progress Note  07/13/2022 8:32 AM Jill Sandoval  MRN:  [id number]  Chief Complaint:  Chief Complaint  Patient presents with   Follow-up   Anxiety   Medication Refill   HPI: Jill Sandoval is a 31 year old Caucasian female, married, currently lives in [place], has a history of bipolar disorder type II was evaluated by telemedicine today.  Patient today reports she is currently doing well with regards to her mood symptoms.  She was able to taper off the risperidone completely in April 2024.  She is doing well without the risperidone.  Denies any significant sadness, hypomania or manic symptoms.  Denies any mood swings, irritability.  Anxiety is manageable.  Has not had any panic attacks in a long time.  Reports sleep is good.  Reports appetite is fair.  Continues to struggle with weight gain.  Could not afford the injectable weight loss medication.  Since stopping it she has been gaining a lot of weight.  She plans to talk to her primary doctor.  Patient is currently compliant on Trileptal, nortriptyline,  BuSpar.  Denies side effects.  Reports work is going well.  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  She enjoys her work.  Denies any other concerns today.  Visit Diagnosis:    ICD-10-CM   1. Bipolar disorder, in full remission, most recent episode depressed ([facility])  F31.76 nortriptyline (PAMELOR) 75 MG capsule    2. Panic disorder  F41.0 Oxcarbazepine (TRILEPTAL) 300 MG tablet    nortriptyline (PAMELOR) 75 MG capsule    busPIRone (BUSPAR) 10 MG tablet    3. Insomnia due to medical condition  G47.01    Anxiety      Past Psychiatric History: I have reviewed past psychiatric history from progress note on 10/15/2018.  Past trials of Celexa, Trileptal.  Past Medical History:  Past Medical History:  Diagnosis Date   Anxiety and depression    Asthma    inhaler prn   Fetal demise > 22 weeks, delivered, current hospitalization 11/04/2015   Miscarriage     Past Surgical History:  Procedure Laterality Date   CESAREAN SECTION N/A 03/24/2012   Procedure: CESAREAN SECTION;  Surgeon: Mickel Baas, MD;  Location: [facility];  Service: Obstetrics;  Laterality: N/A;   CESAREAN SECTION N/A 09/24/2016   Procedure: CESAREAN SECTION;  Surgeon: Waynard Reeds, MD;  Location: [facility];  Service: Obstetrics;  Laterality: N/A;    Family Psychiatric History: I have reviewed family psychiatric history from progress note on 10/15/2018.  Family History:  Family History  Problem Relation Age of Onset   Healthy Mother    Hypertension Father    Hearing loss Brother    Hypertension Maternal Grandmother    Prostate cancer Maternal Grandfather    Hearing loss Paternal Grandfather    Other Neg Hx     Social History: I have reviewed social history from progress note on 10/15/2018. Social History   Socioeconomic History   Marital status: Married    Spouse name: Not on file   Number of children: 2   Years of education: Not on file   Highest  education level: Not on file  Occupational History   Not on file  Tobacco Use   Smoking status: Former    Packs/day: .25    Types: Cigarettes    Quit date: 11/14/2018    Years since quitting: 3.6   Smokeless tobacco: Never  Vaping Use   Vaping Use: Some days  Substance and Sexual Activity   Alcohol use: No   Drug use: No   Sexual activity: Yes    Birth control/protection: None  Other Topics Concern   Not on file  Social History Narrative   Married.   2 children.   Works at [employer].   Enjoys camping, being outdoors.    Social Determinants of Health   Financial Resource Strain: Not on file  Food Insecurity: Not on file  Transportation Needs: Not on file  Physical Activity: Not on file  Stress: Not on file  Social Connections: Not on file    Allergies: No Known Allergies  Metabolic Disorder Labs: Lab Results  Component Value Date   HGBA1C 5.3 03/13/2022   MPG 105 03/13/2022   MPG 97 07/14/2021   No results found for: "PROLACTIN" Lab Results  Component Value Date   CHOL 158 03/13/2022   TRIG 100 03/13/2022   HDL 55 03/13/2022   CHOLHDL 2.9 03/13/2022   VLDL 20 06/14/2020   LDLCALC 83 03/13/2022   LDLCALC 117 (H) 07/14/2021   Lab Results  Component Value Date   TSH 1.34 04/27/2020   TSH 1.42 07/08/2019    Therapeutic Level Labs: No results found for: "LITHIUM" No results found for: "VALPROATE" No results found for: "CBMZ"  Current Medications: Current Outpatient Medications  Medication Sig Dispense Refill   atorvastatin (LIPITOR) 10 MG tablet Take 1 tablet by mouth once daily 90 tablet 0   hydrOXYzine (VISTARIL) 25 MG capsule Take 1-2 capsules (25-50 mg total) by mouth as directed. Take for severe panic attacks 60 capsule 1   YAZ 3-0.02 MG tablet Take 1 tablet by mouth daily.     busPIRone (BUSPAR) 10 MG tablet Take 1 tablet (10 mg total) by mouth 2 (two) times daily. 180 tablet 0   nortriptyline (PAMELOR) 75 MG capsule Take 1 capsule (75 mg total)  by mouth at bedtime. 90 capsule 0   Oxcarbazepine (TRILEPTAL) 300 MG tablet Take 3 tablets (900 mg total) by mouth 2 (two) times daily. 540 tablet 0   No current facility-administered medications for this visit.     Musculoskeletal: Strength & Muscle Tone:  UTA Gait & Station:  Seated Patient leans: N/A  Psychiatric Specialty Exam: Review of Systems  Psychiatric/Behavioral: Negative.      There were no vitals taken for this visit.There is no height or weight on file to calculate BMI.  General Appearance: Casual  Eye Contact:  Fair  Speech:  Clear and Coherent  Volume:  Normal  Mood:  Euthymic  Affect:  Congruent  Thought  Process:  Goal Directed and Descriptions of Associations: Intact  Orientation:  Full (Time, Place, and Person)  Thought Content: Logical   Suicidal Thoughts:  No  Homicidal Thoughts:  No  Memory:  Immediate;   Fair Recent;   Fair Remote;   Fair  Judgement:  Fair  Insight:  Fair  Psychomotor Activity:  Normal  Concentration:  Concentration: Fair and Attention Span: Fair  Recall:  [employer] of Knowledge: Fair  Language: Fair  Akathisia:  No  Handed:  Right  AIMS (if indicated): not done  Assets:  Communication Skills Desire for Improvement Housing Social Support  ADL's:  Intact  Cognition: WNL  Sleep:  Fair   Screenings: AIMS    Flowsheet Row Video Visit from 04/26/2022 in [facility] Video Visit from 11/15/2021 in [facility] Office Visit from 08/03/2021 in [facility] Video Visit from 05/19/2021 in [facility] Video Visit from 02/17/2021 in [facility]  AIMS Total Score 0 0 0 0 0      GAD-7    Flowsheet Row Video Visit from 07/12/2022 in [facility] Video Visit from 04/26/2022 in [facility] Office Visit from 03/13/2022 in [facility] Video Visit from 11/15/2021 in [facility] Office Visit from 07/14/2021 in [facility]  Total GAD-7 Score 0 0 0 0 1      PHQ2-9    Flowsheet Row Video Visit from 07/12/2022 in [facility] Video Visit from 04/26/2022 in [facility] Video Visit from 03/19/2022 in [facility] Office Visit from 03/13/2022 in [facility] Video Visit from 11/15/2021 in [facility]  PHQ-2 Total Score 0 0 0 0 0  PHQ-9 Total Score -- 1 -- -- 0      Flowsheet Row Video Visit from 07/12/2022 in [facility] Video Visit from 04/26/2022 in [facility] Video Visit from 03/19/2022 in [facility]  C-SSRS RISK CATEGORY No Risk No Risk No Risk        Assessment and Plan: PHELICIA LADWIG is a 32 year old Caucasian female, married, lives in [place], has a history of bipolar disorder, panic attacks was evaluated by telemedicine today.  Patient currently tapered off of the risperidone, currently doing well on the other psychotropics, although struggles with weight gain, will benefit from the following plan.  Plan Bipolar disorder type II in remission Discontinue risperidone, patient no longer on it.  Tapered off. Trileptal 900 mg p.o. twice daily.  Panic disorder-stable BuSpar 10 mg p.o. twice daily.  Long-term plan to taper off. Nortriptyline 75 mg p.o. daily at bedtime Hydroxyzine 25-50 mg p.o. daily as needed  Insomnia-stable Nortriptyline 75 mg p.o. nightly. Hydroxyzine 25-50 mg at bedtime as needed Melatonin 3 to 5 mg at bedtime as  needed  Follow-up in clinic in 4 months or sooner in person.  Collaboration of Care: Collaboration of Care: Primary Care Provider AEB patient to talk to her primary care provider regarding her weight gain, alternate options for the same.  Patient/Guardian was advised Release of Information must be obtained prior to any record release in order to collaborate their care with an  outside provider. Patient/Guardian was advised if they have not already done so to contact the registration department to sign all necessary forms in order for [place] to release information regarding their care.   Consent: Patient/Guardian gives verbal consent for treatment and assignment of benefits for services provided during this visit. Patient/Guardian expressed understanding and agreed to proceed.   This note was generated in part or whole with voice recognition software. Voice recognition is usually quite accurate but there are transcription errors that can and very often do occur. I apologize for any typographical errors that were not detected and corrected.    Jomarie Longs, MD 07/13/2022, 8:32 AM

## 2022-07-30 DIAGNOSIS — Z419 Encounter for procedure for purposes other than remedying health state, unspecified: Secondary | ICD-10-CM | POA: Diagnosis not present

## 2022-08-30 DIAGNOSIS — Z419 Encounter for procedure for purposes other than remedying health state, unspecified: Secondary | ICD-10-CM | POA: Diagnosis not present

## 2022-09-06 ENCOUNTER — Other Ambulatory Visit: Payer: Self-pay | Admitting: Internal Medicine

## 2022-09-07 NOTE — Telephone Encounter (Signed)
Requested Prescriptions  Pending Prescriptions Disp Refills   atorvastatin (LIPITOR) 10 MG tablet [Pharmacy Med Name: Atorvastatin Calcium 10 MG Oral Tablet] 90 tablet 0    Sig: Take 1 tablet by mouth once daily     Cardiovascular:  Antilipid - Statins Failed - 09/06/2022  8:07 PM      Failed - Lipid Panel in normal range within the last 12 months    Cholesterol, Total  Date Value Ref Range Status  06/30/2015 194 100 - 199 mg/dL Final   Cholesterol  Date Value Ref Range Status  03/13/2022 158 <200 mg/dL Final   LDL Cholesterol (Calc)  Date Value Ref Range Status  03/13/2022 83 mg/dL (calc) Final    Comment:    Reference range: <100 . Desirable range <100 mg/dL for primary prevention;   <70 mg/dL for patients with CHD or diabetic patients  with > or = 2 CHD risk factors. Marland Kitchen LDL-C is now calculated using the Martin-Hopkins  calculation, which is a validated novel method providing  better accuracy than the Friedewald equation in the  estimation of LDL-C.  Horald Pollen et al. Lenox Ahr. 1610;960(45): 2061-2068  (http://education.[employer].com/faq/FAQ164)    Direct LDL  Date Value Ref Range Status  08/27/2019 144.0 mg/dL Final    Comment:    Optimal:  <100 mg/dLNear or Above Optimal:  100-129 mg/dLBorderline High:  130-159 mg/dLHigh:  160-189 mg/dLVery High:  >190 mg/dL   HDL  Date Value Ref Range Status  03/13/2022 55 > OR = 50 mg/dL Final  40/98/1191 57 >47 mg/dL Final   Triglycerides  Date Value Ref Range Status  03/13/2022 100 <150 mg/dL Final         Passed - Patient is not pregnant      Passed - Valid encounter within last 12 months    Recent Outpatient Visits           5 months ago Encounter for general adult medical examination with abnormal findings   [place] [facility] [place], Salvadore Oxford, NP   1 year ago Snoring   [place] [place], Salvadore Oxford, NP   1 year ago Upper respiratory infection with cough and  congestion   [place] [facility] [place], Salvadore Oxford, NP   1 year ago Encounter for general adult medical examination with abnormal findings   [place] [place], Salvadore Oxford, NP       Future Appointments             In 1 week [place] Si, Salvadore Oxford, NP  [facility], [facility]

## 2022-09-20 ENCOUNTER — Ambulatory Visit (INDEPENDENT_AMBULATORY_CARE_PROVIDER_SITE_OTHER): Payer: 59 | Admitting: Internal Medicine

## 2022-09-20 ENCOUNTER — Encounter: Payer: Self-pay | Admitting: Internal Medicine

## 2022-09-20 VITALS — BP 114/70 | HR 104 | Temp 97.0°F | Ht 62.0 in | Wt 272.0 lb

## 2022-09-20 DIAGNOSIS — F411 Generalized anxiety disorder: Secondary | ICD-10-CM | POA: Diagnosis not present

## 2022-09-20 DIAGNOSIS — Z6841 Body Mass Index (BMI) 40.0 and over, adult: Secondary | ICD-10-CM | POA: Diagnosis not present

## 2022-09-20 DIAGNOSIS — E282 Polycystic ovarian syndrome: Secondary | ICD-10-CM | POA: Diagnosis not present

## 2022-09-20 DIAGNOSIS — F410 Panic disorder [episodic paroxysmal anxiety] without agoraphobia: Secondary | ICD-10-CM | POA: Diagnosis not present

## 2022-09-20 DIAGNOSIS — F3181 Bipolar II disorder: Secondary | ICD-10-CM

## 2022-09-20 DIAGNOSIS — E782 Mixed hyperlipidemia: Secondary | ICD-10-CM | POA: Diagnosis not present

## 2022-09-20 DIAGNOSIS — R519 Headache, unspecified: Secondary | ICD-10-CM | POA: Diagnosis not present

## 2022-09-20 DIAGNOSIS — G4701 Insomnia due to medical condition: Secondary | ICD-10-CM | POA: Diagnosis not present

## 2022-09-20 MED ORDER — YAZ 3-0.02 MG PO TABS: 1.0000 | ORAL_TABLET | Freq: Every day | ORAL | 1 refills | Status: DC

## 2022-09-20 NOTE — Assessment & Plan Note (Signed)
Continue buspirone, hydroxyzine and Trileptal as prescribed by psychiatry Support offered

## 2022-09-20 NOTE — Patient Instructions (Signed)

## 2022-09-20 NOTE — Progress Notes (Signed)
Subjective:    Patient ID: Jill Sandoval, female    DOB: 03-24-1990, 32 y.o.   MRN: [id number]  HPI  Patient presents to clinic today for follow-up of chronic conditions.  Anxiety and bipolar depression: Chronic, managed on buspirone, hydroxyzine and trileptal.  She is currently seeing a therapist and psychiatrist.  She denies SI/HI.  Frequent headaches: These occur every few weeks.  Triggered by stress.  She is taking nortriptyline as prescribed.  She occasionally takes ibuprofen as needed for breakthrough.  She does not follow with neurology.  Insomnia: She has difficulty falling and staying asleep.  She is taking nortriptyline as prescribed.  There is no sleep study on file.  PCOS: Managed with OCPs.  She reports she ran out of these 3 weeks ago.  She does follow with GYN.  HLD: Her last LDL was 83, triglycerides 782, 03/2022.  She denies myalgias on atorvastatin.  She tries to consume a low-fat diet.  Review of Systems     Past Medical History:  Diagnosis Date   Anxiety and depression    Asthma    inhaler prn   Fetal demise > 22 weeks, delivered, current hospitalization 11/04/2015   Miscarriage     Current Outpatient Medications  Medication Sig Dispense Refill   atorvastatin (LIPITOR) 10 MG tablet Take 1 tablet by mouth once daily 90 tablet 0   busPIRone (BUSPAR) 10 MG tablet Take 1 tablet (10 mg total) by mouth 2 (two) times daily. 180 tablet 0   hydrOXYzine (VISTARIL) 25 MG capsule Take 1-2 capsules (25-50 mg total) by mouth as directed. Take for severe panic attacks 60 capsule 1   nortriptyline (PAMELOR) 75 MG capsule Take 1 capsule (75 mg total) by mouth at bedtime. 90 capsule 0   Oxcarbazepine (TRILEPTAL) 300 MG tablet Take 3 tablets (900 mg total) by mouth 2 (two) times daily. 540 tablet 0   YAZ 3-0.02 MG tablet Take 1 tablet by mouth daily.     No current facility-administered medications for this visit.    No Known Allergies  Family History  Problem  Relation Age of Onset   Healthy Mother    Hypertension Father    Hearing loss Brother    Hypertension Maternal Grandmother    Prostate cancer Maternal Grandfather    Hearing loss Paternal Grandfather    Other Neg Hx     Social History   Socioeconomic History   Marital status: Married    Spouse name: Not on file   Number of children: 2   Years of education: Not on file   Highest education level: Not on file  Occupational History   Not on file  Tobacco Use   Smoking status: Former    Current packs/day: 0.00    Types: Cigarettes    Quit date: 11/14/2018    Years since quitting: 3.8   Smokeless tobacco: Never  Vaping Use   Vaping status: Some Days  Substance and Sexual Activity   Alcohol use: No   Drug use: No   Sexual activity: Yes    Birth control/protection: None  Other Topics Concern   Not on file  Social History Narrative   Married.   2 children.   Works at [employer].   Enjoys camping, being outdoors.    Social Determinants of Health   Financial Resource Strain: Not on file  Food Insecurity: Not on file  Transportation Needs: Not on file  Physical Activity: Not on file  Stress: Not on file  Social Connections: Not on file  Intimate Partner Violence: Not on file     Constitutional: Pt reports intermittent headaches. Denies fever, malaise, fatigue, or abrupt weight changes.  HEENT: Denies eye pain, eye redness, ear pain, ringing in the ears, wax buildup, runny nose, nasal congestion, bloody nose, or sore throat. Respiratory: Denies difficulty breathing, shortness of breath, cough or sputum production.   Cardiovascular: Denies chest pain, chest tightness, palpitations or swelling in the hands or feet.  Gastrointestinal: Denies abdominal pain, bloating, constipation, diarrhea or blood in the stool.  GU: Denies urgency, frequency, pain with urination, burning sensation, blood in urine, odor or discharge. Musculoskeletal: Denies decrease in range of motion,  difficulty with gait, muscle pain or joint pain and swelling.  Skin: Denies redness, rashes, lesions or ulcercations.  Neurological: Pt reports insomnia. Denies dizziness, difficulty with memory, difficulty with speech or problems with balance and coordination.  Psych: Pt has a history of anxiety and depression. Denies SI/HI.  No other specific complaints in a complete review of systems (except as listed in HPI above).  Objective:   Physical Exam  BP 114/70 (BP Location: Left Arm, Patient Position: Sitting, Cuff Size: Large)   Pulse (!) 104   Temp (!) 97 F (36.1 C) (Temporal)   Ht 5\' 2"  (1.575 m)   Wt 272 lb (123.4 kg)   SpO2 99%   BMI 49.75 kg/m  Wt Readings from Last 3 Encounters:  09/20/22 272 lb (123.4 kg)  03/13/22 281 lb (127.5 kg)  07/14/21 273 lb (123.8 kg)    General: Appears her stated age, obese, in NAD. Skin: Warm, dry and intact.  HEENT: Head: normal shape and size; Eyes: sclera white, no icterus, conjunctiva pink, PERRLA and EOMs intact;  Cardiovascular: Tachycardic with normal rhythm. S1,S2 noted.  No murmur, rubs or gallops noted. No JVD or BLE edema.  Pulmonary/Chest: Normal effort and positive vesicular breath sounds. No respiratory distress. No wheezes, rales or ronchi noted.  Musculoskeletal: No difficulty with gait.  Neurological: Alert and oriented. Coordination normal.  Psychiatric: Mood and affect normal. Behavior is normal. Judgment and thought content normal.    BMET    Component Value Date/Time   NA 141 03/13/2022 0901   NA 136 06/30/2015 0000   NA 137 07/28/2011 1558   K 4.3 03/13/2022 0901   K 3.7 07/28/2011 1558   CL 105 03/13/2022 0901   CL 107 07/28/2011 1558   CO2 24 03/13/2022 0901   CO2 22 07/28/2011 1558   GLUCOSE 90 03/13/2022 0901   GLUCOSE 76 07/28/2011 1558   BUN 14 03/13/2022 0901   BUN 9 06/30/2015 0000   BUN 11 07/28/2011 1558   CREATININE 0.95 03/13/2022 0901   CALCIUM 9.3 03/13/2022 0901   CALCIUM 9.1 07/28/2011 1558    GFRNONAA >60 06/09/2020 0946   GFRNONAA >60 07/28/2011 1558   GFRAA 136 06/30/2015 0000   GFRAA >60 07/28/2011 1558    Lipid Panel     Component Value Date/Time   CHOL 158 03/13/2022 0901   CHOL 194 06/30/2015 0000   TRIG 100 03/13/2022 0901   HDL 55 03/13/2022 0901   HDL 57 06/30/2015 0000   CHOLHDL 2.9 03/13/2022 0901   VLDL 20 06/14/2020 0927   LDLCALC 83 03/13/2022 0901    CBC    Component Value Date/Time   WBC 6.0 03/13/2022 0901   RBC 4.51 03/13/2022 0901   HGB 12.9 03/13/2022 0901   HGB 12.6 06/30/2015 0000   HCT 38.5 03/13/2022 0901  HCT 38.1 06/30/2015 0000   PLT 266 03/13/2022 0901   PLT 320 06/30/2015 0000   MCV 85.4 03/13/2022 0901   MCV 84 06/30/2015 0000   MCV 89 07/28/2011 1558   MCH 28.6 03/13/2022 0901   MCHC 33.5 03/13/2022 0901   RDW 12.8 03/13/2022 0901   RDW 13.9 06/30/2015 0000   RDW 13.3 07/28/2011 1558   LYMPHSABS 2.2 06/09/2020 0946   LYMPHSABS 2.4 06/30/2015 0000   MONOABS 0.4 06/09/2020 0946   EOSABS 0.3 06/09/2020 0946   EOSABS 0.2 06/30/2015 0000   BASOSABS 0.0 06/09/2020 0946   BASOSABS 0.0 06/30/2015 0000    Hgb A1C Lab Results  Component Value Date   HGBA1C 5.3 03/13/2022            Assessment & Plan:    RTC in 6 months for your annual exam Nicki Reaper, NP

## 2022-09-20 NOTE — Assessment & Plan Note (Signed)
Continue OCPs, refilled today

## 2022-09-20 NOTE — Assessment & Plan Note (Signed)
Encourage diet and exercise for weight loss 

## 2022-09-20 NOTE — Assessment & Plan Note (Signed)
Continue nortriptyline as prescribed by psychiatry Will monitor

## 2022-09-20 NOTE — Assessment & Plan Note (Signed)
Continue nortriptyline and ibuprofen as needed

## 2022-09-20 NOTE — Assessment & Plan Note (Signed)
Lipid profile reviewed Encouraged her to consume a low fat diet Continue atorvastatin 

## 2022-09-30 DIAGNOSIS — Z419 Encounter for procedure for purposes other than remedying health state, unspecified: Secondary | ICD-10-CM | POA: Diagnosis not present

## 2022-10-05 ENCOUNTER — Other Ambulatory Visit: Payer: Self-pay | Admitting: Psychiatry

## 2022-10-05 DIAGNOSIS — F410 Panic disorder [episodic paroxysmal anxiety] without agoraphobia: Secondary | ICD-10-CM

## 2022-10-20 ENCOUNTER — Encounter: Payer: Self-pay | Admitting: Internal Medicine

## 2022-10-30 DIAGNOSIS — Z419 Encounter for procedure for purposes other than remedying health state, unspecified: Secondary | ICD-10-CM | POA: Diagnosis not present

## 2022-11-13 ENCOUNTER — Ambulatory Visit: Payer: PRIVATE HEALTH INSURANCE | Admitting: Psychiatry

## 2022-11-14 ENCOUNTER — Ambulatory Visit (INDEPENDENT_AMBULATORY_CARE_PROVIDER_SITE_OTHER): Payer: 59 | Admitting: Internal Medicine

## 2022-11-14 ENCOUNTER — Encounter: Payer: Self-pay | Admitting: Internal Medicine

## 2022-11-14 VITALS — BP 130/82 | HR 106 | Ht 62.0 in | Wt 271.0 lb

## 2022-11-14 DIAGNOSIS — R051 Acute cough: Secondary | ICD-10-CM

## 2022-11-14 DIAGNOSIS — Z20828 Contact with and (suspected) exposure to other viral communicable diseases: Secondary | ICD-10-CM | POA: Diagnosis not present

## 2022-11-14 LAB — POCT INFLUENZA A/B
Influenza A, POC: NEGATIVE
Influenza B, POC: NEGATIVE

## 2022-11-14 LAB — POC COVID19 BINAXNOW: SARS Coronavirus 2 Ag: NEGATIVE

## 2022-11-14 MED ORDER — BENZONATATE 200 MG PO CAPS: 200.0000 mg | ORAL_CAPSULE | Freq: Three times a day (TID) | ORAL | 0 refills | Status: DC | PRN

## 2022-11-14 MED ORDER — OSELTAMIVIR PHOSPHATE 75 MG PO CAPS: 75.0000 mg | ORAL_CAPSULE | Freq: Every day | ORAL | 0 refills | Status: DC

## 2022-11-14 NOTE — Progress Notes (Signed)
Subjective:    Patient ID: Jill Sandoval, female    DOB: 09/03/1990, 32 y.o.   MRN: [id number]  HPI  Discussed the use of AI scribe software for clinical note transcription with the patient, who gave verbal consent to proceed.  The patient, who has been in close contact with a confirmed flu case, presents with a constellation of symptoms that began late Sunday night. They report extreme fatigue, to the point of being able to fall asleep despite having just woken up. Accompanying this fatigue is a severe headache that temporarily responds to Tylenol but returns as the medication wears off.  The patient denies nasal congestion or a runny nose but has a significant cough that has become productive with some green sputum. They describe the cough as deep and intense, leading to lightheadedness due to the force of the coughing. They also report shortness of breath, which was particularly noticeable after taking a shower, leaving them feeling as if they had run a marathon.  In addition to these respiratory symptoms, the patient has experienced some nausea but denies vomiting or diarrhea. They report having chills and night sweats, despite their home being cool.  The patient has been managing their symptoms with an over-the-counter cold medication, similar to Mucinex, which provides some relief. However, they note that their symptoms seem to worsen as the day progresses into the evening.  The patient has not received a flu shot this season and has been in close contact with a confirmed flu case. They have not been prescribed Tamiflu or any other antiviral medication.       Review of Systems  Past Medical History:  Diagnosis Date   Anxiety and depression    Asthma    inhaler prn   Fetal demise > 22 weeks, delivered, current hospitalization 11/04/2015   Miscarriage     Current Outpatient Medications  Medication Sig Dispense Refill   atorvastatin (LIPITOR) 10 MG tablet Take 1 tablet by  mouth once daily 90 tablet 0   busPIRone (BUSPAR) 10 MG tablet Take 1 tablet (10 mg total) by mouth 2 (two) times daily. 180 tablet 0   hydrOXYzine (VISTARIL) 25 MG capsule Take 1-2 capsules (25-50 mg total) by mouth as directed. Take for severe panic attacks 60 capsule 1   nortriptyline (PAMELOR) 75 MG capsule Take 1 capsule (75 mg total) by mouth at bedtime. 90 capsule 0   Oxcarbazepine (TRILEPTAL) 300 MG tablet TAKE 3 TABLETS BY MOUTH TWICE DAILY 540 tablet 0   YAZ 3-0.02 MG tablet Take 1 tablet by mouth daily. 84 tablet 1   No current facility-administered medications for this visit.    No Known Allergies  Family History  Problem Relation Age of Onset   Healthy Mother    Hypertension Father    Hearing loss Brother    Hypertension Maternal Grandmother    Prostate cancer Maternal Grandfather    Hearing loss Paternal Grandfather    Other Neg Hx     Social History   Socioeconomic History   Marital status: Married    Spouse name: Not on file   Number of children: 2   Years of education: Not on file   Highest education level: 12th grade  Occupational History   Not on file  Tobacco Use   Smoking status: Former    Current packs/day: 0.00    Types: Cigarettes    Quit date: 11/14/2018    Years since quitting: 4.0   Smokeless tobacco: Never  Vaping  Use   Vaping status: Some Days  Substance and Sexual Activity   Alcohol use: No   Drug use: No   Sexual activity: Yes    Birth control/protection: None  Other Topics Concern   Not on file  Social History Narrative   Married.   2 children.   Works at [employer].   Enjoys camping, being outdoors.    Social Determinants of Health   Financial Resource Strain: Low Risk  (11/13/2022)   Overall Financial Resource Strain (CARDIA)    Difficulty of Paying Living Expenses: Not hard at all  Food Insecurity: No Food Insecurity (11/13/2022)   Hunger Vital Sign    Worried About Running Out of Food in the Last Year: Never true    Ran  Out of Food in the Last Year: Never true  Transportation Needs: No Transportation Needs (11/13/2022)   PRAPARE - Administrator, Civil Service (Medical): No    Lack of Transportation (Non-Medical): No  Physical Activity: Unknown (11/13/2022)   Exercise Vital Sign    Days of Exercise per Week: Patient declined    Minutes of Exercise per Session: Not on file  Stress: Stress Concern Present (11/13/2022)   [employer] of Occupational Health - Occupational Stress Questionnaire    Feeling of Stress : To some extent  Social Connections: Unknown (11/13/2022)   Social Connection and Isolation Panel [NHANES]    Frequency of Communication with Friends and Family: More than three times a week    Frequency of Social Gatherings with Friends and Family: More than three times a week    Attends Religious Services: Patient declined    Database administrator or Organizations: No    Attends Engineer, structural: Not on file    Marital Status: Married  Catering manager Violence: Not on file     Constitutional: Pt reports fatigue, malaise, headache, chills and sweats. Denies fever, malaise, fatigue, or abrupt weight changes.  HEENT: Pt reports sore throat. Denies eye pain, eye redness, ear pain, ringing in the ears, wax buildup, runny nose, nasal congestion, bloody nose. Respiratory: Pt reports cough and shortness of breath. Denies difficulty breathing.   Cardiovascular: Denies chest pain, chest tightness, palpitations or swelling in the hands or feet.  Gastrointestinal: Pt reports nausea. Denies abdominal pain, bloating, constipation, diarrhea or blood in the stool.  GU: Denies urgency, frequency, pain with urination, burning sensation, blood in urine, odor or discharge. Musculoskeletal: Denies decrease in range of motion, difficulty with gait, muscle pain or joint pain and swelling.  Skin: Denies redness, rashes, lesions or ulcercations.  Neurological: Denies dizziness,  difficulty with memory, difficulty with speech or problems with balance and coordination.    No other specific complaints in a complete review of systems (except as listed in HPI above).     Objective:   Physical Exam  BP 130/82   Pulse (!) 106   Ht 5\' 2"  (1.575 m)   Wt 271 lb (122.9 kg)   SpO2 100%   BMI 49.57 kg/m   Wt Readings from Last 3 Encounters:  09/20/22 272 lb (123.4 kg)  03/13/22 281 lb (127.5 kg)  07/14/21 273 lb (123.8 kg)    General: Appears her stated age, obese, in NAD. Skin: Warm, dry and intact. No rashes noted. HEENT: Head: normal shape and size; Eyes: sclera white, no icterus, conjunctiva pink, PERRLA and EOMs intact; Nose: mucosa pink and moist, septum midline; Throat/Mouth: Teeth present, mucosa pink and moist, no exudate, lesions or  ulcerations noted.  Neck:  No adenopathy noted.  Cardiovascular: Tachycardic with normal rhythm. S1,S2 noted.  No murmur, rubs or gallops noted.  Pulmonary/Chest: Normal effort and positive vesicular breath sounds. No respiratory distress. No wheezes, rales or ronchi noted.  Musculoskeletal: No difficulty with gait.  Neurological: Alert and oriented.  Coordination normal.    BMET    Component Value Date/Time   NA 141 03/13/2022 0901   NA 136 06/30/2015 0000   NA 137 07/28/2011 1558   K 4.3 03/13/2022 0901   K 3.7 07/28/2011 1558   CL 105 03/13/2022 0901   CL 107 07/28/2011 1558   CO2 24 03/13/2022 0901   CO2 22 07/28/2011 1558   GLUCOSE 90 03/13/2022 0901   GLUCOSE 76 07/28/2011 1558   BUN 14 03/13/2022 0901   BUN 9 06/30/2015 0000   BUN 11 07/28/2011 1558   CREATININE 0.95 03/13/2022 0901   CALCIUM 9.3 03/13/2022 0901   CALCIUM 9.1 07/28/2011 1558   GFRNONAA >60 06/09/2020 0946   GFRNONAA >60 07/28/2011 1558   GFRAA 136 06/30/2015 0000   GFRAA >60 07/28/2011 1558    Lipid Panel     Component Value Date/Time   CHOL 158 03/13/2022 0901   CHOL 194 06/30/2015 0000   TRIG 100 03/13/2022 0901   HDL 55  03/13/2022 0901   HDL 57 06/30/2015 0000   CHOLHDL 2.9 03/13/2022 0901   VLDL 20 06/14/2020 0927   LDLCALC 83 03/13/2022 0901    CBC    Component Value Date/Time   WBC 6.0 03/13/2022 0901   RBC 4.51 03/13/2022 0901   HGB 12.9 03/13/2022 0901   HGB 12.6 06/30/2015 0000   HCT 38.5 03/13/2022 0901   HCT 38.1 06/30/2015 0000   PLT 266 03/13/2022 0901   PLT 320 06/30/2015 0000   MCV 85.4 03/13/2022 0901   MCV 84 06/30/2015 0000   MCV 89 07/28/2011 1558   MCH 28.6 03/13/2022 0901   MCHC 33.5 03/13/2022 0901   RDW 12.8 03/13/2022 0901   RDW 13.9 06/30/2015 0000   RDW 13.3 07/28/2011 1558   LYMPHSABS 2.2 06/09/2020 0946   LYMPHSABS 2.4 06/30/2015 0000   MONOABS 0.4 06/09/2020 0946   EOSABS 0.3 06/09/2020 0946   EOSABS 0.2 06/30/2015 0000   BASOSABS 0.0 06/09/2020 0946   BASOSABS 0.0 06/30/2015 0000    Hgb A1C Lab Results  Component Value Date   HGBA1C 5.3 03/13/2022           Assessment & Plan:    Assessment and Plan    Influenza Exposure Patient presents with fatigue, headache, cough, and shortness of breath. Recent exposure to confirmed influenza case. Flu and COVID tests negative. -Prophylactic Tamiflu 75mg  once daily for 10 days. If subsequent flu test positive, increase to twice daily until course completed. -Tessalon 200mg  three times daily for cough.  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 Given recent exposure to influenza and current symptoms, recommended time off work. -Provide work note for return on Monday.       RTC in 4 months for your annual exam Nicki Reaper, NP

## 2022-11-16 ENCOUNTER — Encounter: Payer: Self-pay | Admitting: Internal Medicine

## 2022-11-21 MED ORDER — VARENICLINE TARTRATE (STARTER) 0.5 MG X 11 & 1 MG X 42 PO TBPK: ORAL_TABLET | ORAL | 0 refills | Status: DC

## 2022-11-21 NOTE — Addendum Note (Signed)
Addended by: Lorre Munroe on: 11/21/2022 10:32 AM   Modules accepted: Orders

## 2022-11-22 ENCOUNTER — Telehealth: Payer: Self-pay

## 2022-11-22 NOTE — Telephone Encounter (Signed)
A prior authorization for Varenicline Tartrate, Starter, (CHANTIX STARTING MONTH PAK) 0.5 MG X 11 & 1 MG X 42 TBPK [[id number]] has been started in Cover My Meds.

## 2022-11-23 NOTE — Progress Notes (Signed)
(  Key: [id number])  form thumbnail This request has received a Favorable outcome.  Please note any additional information provided by [facility] at the bottom of your screen.

## 2022-11-23 NOTE — Progress Notes (Signed)
(  Key: [id number]) PA Case ID #: [id number] Rx #: [id number] Need Help? Call us at [phone] Outcome Approved today by [facility] 2017 NCPDP Request Reference Number: [id number]. VARENICLINE TAB 0.5& 1MG  is approved through 11/23/2023. Your patient may now fill this prescription and it will be covered. Authorization Expiration Date: 11/23/2023

## 2022-11-23 NOTE — Progress Notes (Signed)
(  Key: [id number])  form thumbnail [facility] is reviewing your PA request. Typically an electronic response will be received within 24-72 hours. To check for an update later, open this request from your dashboard.  You may close this dialog and return to your dashboard to perform other tasks.

## 2022-11-26 NOTE — Telephone Encounter (Signed)
Denial of coverage received.

## 2022-11-30 DIAGNOSIS — Z419 Encounter for procedure for purposes other than remedying health state, unspecified: Secondary | ICD-10-CM | POA: Diagnosis not present

## 2022-12-11 ENCOUNTER — Ambulatory Visit (INDEPENDENT_AMBULATORY_CARE_PROVIDER_SITE_OTHER): Payer: 59 | Admitting: Psychiatry

## 2022-12-11 ENCOUNTER — Encounter: Payer: Self-pay | Admitting: Psychiatry

## 2022-12-11 VITALS — BP 139/92 | HR 94 | Temp 98.3°F | Ht 62.0 in | Wt 273.4 lb

## 2022-12-11 DIAGNOSIS — F3176 Bipolar disorder, in full remission, most recent episode depressed: Secondary | ICD-10-CM

## 2022-12-11 DIAGNOSIS — R0683 Snoring: Secondary | ICD-10-CM | POA: Diagnosis not present

## 2022-12-11 DIAGNOSIS — G4701 Insomnia due to medical condition: Secondary | ICD-10-CM

## 2022-12-11 DIAGNOSIS — F410 Panic disorder [episodic paroxysmal anxiety] without agoraphobia: Secondary | ICD-10-CM

## 2022-12-11 DIAGNOSIS — F17200 Nicotine dependence, unspecified, uncomplicated: Secondary | ICD-10-CM

## 2022-12-11 MED ORDER — NICOTINE POLACRILEX 2 MG MT GUM: 2.0000 mg | CHEWING_GUM | OROMUCOSAL | 0 refills | Status: DC | PRN

## 2022-12-11 MED ORDER — NICOTINE 14 MG/24HR TD PT24: 14.0000 mg | MEDICATED_PATCH | Freq: Every day | TRANSDERMAL | 0 refills | Status: DC

## 2022-12-11 NOTE — Patient Instructions (Signed)
Stayquitcoach for smoking cessation

## 2022-12-11 NOTE — Progress Notes (Signed)
BH MD OP Progress Note  12/11/2022 1:10 PM Jill Sandoval  MRN:  [id number]  Chief Complaint:  Chief Complaint  Patient presents with   Follow-up   Anxiety   Medication Refill   HPI: Jill Sandoval is a 32 year old Caucasian female, married, currently lives in [place], has a history of bipolar disorder type II , panic disorder, insomnia, tobacco use disorder was evaluated in office today.  Patient today reports she is currently doing fairly well on the current medication regimen.  Denies any significant mood swings, manic or hypomanic symptoms.  Denies any anxiety symptoms.  She does struggle with sleep issues.  She reports she continues to have snoring.  She does feel tired during the day.  She was referred for sleep study more than a year ago.  She however has been noncompliant.  Patient currently interested in getting a sleep study.  She continues to struggle with smoking cessation.  She tried Chantix and felt down and depressed on it.  She hence stopped taking it.  Patient is interested in a trial of nicotine gum/patches when it was discussed with her.  Patient currently compliant on nortriptyline, Trileptal, BuSpar.  Denies side effects.  Patient denies any suicidality, homicidality or perceptual disturbances.  Patient denies any other concerns today.        Visit Diagnosis:    ICD-10-CM   1. Bipolar disorder, in full remission, most recent episode depressed (HCC)  F31.76     2. Panic disorder  F41.0     3. Insomnia due to medical condition  G47.01    Anxiety    4. Snoring  R06.83 Ambulatory referral to Pulmonology    5. Tobacco use disorder  F17.200 nicotine polacrilex (NICORETTE) 2 MG gum    nicotine (NICODERM CQ - DOSED IN MG/24 HOURS) 14 mg/24hr patch      Past Psychiatric History: I have reviewed past psychiatric history from progress note on 10/15/2018.  Past trials of Celexa, Trileptal.  Past Medical History:  Past Medical History:  Diagnosis Date    Anxiety and depression    Asthma    inhaler prn   Fetal demise > 22 weeks, delivered, current hospitalization 11/04/2015   Miscarriage     Past Surgical History:  Procedure Laterality Date   CESAREAN SECTION N/A 03/24/2012   Procedure: CESAREAN SECTION;  Surgeon: Mickel Baas, MD;  Location: [facility];  Service: Obstetrics;  Laterality: N/A;   CESAREAN SECTION N/A 09/24/2016   Procedure: CESAREAN SECTION;  Surgeon: Waynard Reeds, MD;  Location: [facility];  Service: Obstetrics;  Laterality: N/A;    Family Psychiatric History: I have reviewed family psychiatric history from progress note on 10/15/2018.  Family History:  Family History  Problem Relation Age of Onset   Healthy Mother    Hypertension Father    Hearing loss Brother    Hypertension Maternal Grandmother    Prostate cancer Maternal Grandfather    Hearing loss Paternal Grandfather    Other Neg Hx     Social History: I have reviewed social history from progress note on 10/15/2018. Social History   Socioeconomic History   Marital status: Married    Spouse name: Not on file   Number of children: 2   Years of education: Not on file   Highest education level: 12th grade  Occupational History   Not on file  Tobacco Use   Smoking status: Former    Current packs/day: 0.00    Types: Cigarettes  Quit date: 11/14/2018    Years since quitting: 4.0   Smokeless tobacco: Never  Vaping Use   Vaping status: Some Days  Substance and Sexual Activity   Alcohol use: No   Drug use: No   Sexual activity: Yes    Birth control/protection: None  Other Topics Concern   Not on file  Social History Narrative   Married.   2 children.   Works at [employer].   Enjoys camping, being outdoors.    Social Determinants of Health   Financial Resource Strain: Low Risk  (11/13/2022)   Overall Financial Resource Strain (CARDIA)    Difficulty of Paying Living Expenses: Not hard at all  Food Insecurity: No Food Insecurity  (11/13/2022)   Hunger Vital Sign    Worried About Running Out of Food in the Last Year: Never true    Ran Out of Food in the Last Year: Never true  Transportation Needs: No Transportation Needs (11/13/2022)   PRAPARE - Administrator, Civil Service (Medical): No    Lack of Transportation (Non-Medical): No  Physical Activity: Unknown (11/13/2022)   Exercise Vital Sign    Days of Exercise per Week: Patient declined    Minutes of Exercise per Session: Not on file  Stress: Stress Concern Present (11/13/2022)   [employer] of Occupational Health - Occupational Stress Questionnaire    Feeling of Stress : To some extent  Social Connections: Unknown (11/13/2022)   Social Connection and Isolation Panel [NHANES]    Frequency of Communication with Friends and Family: More than three times a week    Frequency of Social Gatherings with Friends and Family: More than three times a week    Attends Religious Services: Patient declined    Active Member of Clubs or Organizations: No    Attends Engineer, structural: Not on file    Marital Status: Married    Allergies: No Known Allergies  Metabolic Disorder Labs: Lab Results  Component Value Date   HGBA1C 5.3 03/13/2022   MPG 105 03/13/2022   MPG 97 07/14/2021   No results found for: "PROLACTIN" Lab Results  Component Value Date   CHOL 158 03/13/2022   TRIG 100 03/13/2022   HDL 55 03/13/2022   CHOLHDL 2.9 03/13/2022   VLDL 20 06/14/2020   LDLCALC 83 03/13/2022   LDLCALC 117 (H) 07/14/2021   Lab Results  Component Value Date   TSH 1.34 04/27/2020   TSH 1.42 07/08/2019    Therapeutic Level Labs: No results found for: "LITHIUM" No results found for: "VALPROATE" No results found for: "CBMZ"  Current Medications: Current Outpatient Medications  Medication Sig Dispense Refill   atorvastatin (LIPITOR) 10 MG tablet Take 1 tablet by mouth once daily 90 tablet 0   busPIRone (BUSPAR) 10 MG tablet Take 1 tablet  (10 mg total) by mouth 2 (two) times daily. 180 tablet 0   hydrOXYzine (VISTARIL) 25 MG capsule Take 1-2 capsules (25-50 mg total) by mouth as directed. Take for severe panic attacks 60 capsule 1   nicotine (NICODERM CQ - DOSED IN MG/24 HOURS) 14 mg/24hr patch Place 1 patch (14 mg total) onto the skin daily. 28 patch 0   nicotine polacrilex (NICORETTE) 2 MG gum Take 1 each (2 mg total) by mouth as needed for smoking cessation. 100 tablet 0   nortriptyline (PAMELOR) 75 MG capsule Take 1 capsule (75 mg total) by mouth at bedtime. 90 capsule 0   Oxcarbazepine (TRILEPTAL) 300 MG tablet TAKE 3 TABLETS  BY MOUTH TWICE DAILY 540 tablet 0   YAZ 3-0.02 MG tablet Take 1 tablet by mouth daily. 84 tablet 1   No current facility-administered medications for this visit.     Musculoskeletal: Strength & Muscle Tone: within normal limits Gait & Station: normal Patient leans: N/A  Psychiatric Specialty Exam: Review of Systems  Psychiatric/Behavioral: Negative.      Blood pressure (!) 139/92, pulse 94, temperature 98.3 F (36.8 C), temperature source Skin, height 5\' 2"  (1.575 m), weight 273 lb 6.4 oz (124 kg).Body mass index is 50.01 kg/m.  General Appearance: Fairly Groomed  Eye Contact:  Fair  Speech:  Clear and Coherent  Volume:  Normal  Mood:  Euthymic  Affect:  Congruent  Thought Process:  Goal Directed and Descriptions of Associations: Intact  Orientation:  Full (Time, Place, and Person)  Thought Content: Logical   Suicidal Thoughts:  No  Homicidal Thoughts:  No  Memory:  Immediate;   Fair Recent;   Fair Remote;   Fair  Judgement:  Fair  Insight:  Fair  Psychomotor Activity:  Normal  Concentration:  Concentration: Fair and Attention Span: Fair  Recall:  [employer] of Knowledge: Fair  Language: Fair  Akathisia:  No  Handed:  Right  AIMS (if indicated): not done  Assets:  Communication Skills Desire for Improvement Housing Social Support Talents/Skills Transportation  ADL's:   Intact  Cognition: WNL  Sleep:  Fair   Screenings: AIMS    Flowsheet Row Video Visit from [facility]/26/2022 in [facility] Video Visit from 11/15/2021 in [facility] Office Visit from 08/03/2021 in [facility] Video Visit from 05/19/2021 in [facility] Video Visit from 02/17/2021 in [facility]  AIMS Total Score 0 0 0 0 0      GAD-7    Flowsheet Row Office Visit from 12/11/2022 in [facility] Office Visit from 11/14/2022 in [facility] Office Visit from 09/20/2022 in [facility] Video Visit from 07/12/2022 in [facility] Video Visit from 04/26/2022 in [facility]  Total GAD-7 Score 0 2 0 0 0      PHQ2-9    Flowsheet Row Office Visit from 12/11/2022 in [facility] Office Visit from 11/14/2022 in [facility] Office Visit from 09/20/2022 in [facility] Video Visit from 07/12/2022 in [facility] Video Visit from 04/26/2022 in [facility]  PHQ-2 Total Score 1 0 0 0 0  PHQ-9 Total Score 3 2 -- -- 1      Flowsheet Row Office Visit from 12/11/2022 in [facility] Video Visit from 07/12/2022 in [facility] Video Visit from 04/26/2022 in [facility]  C-SSRS RISK CATEGORY No Risk No Risk No Risk        Assessment and Plan: ANAILY HELOU is a 32 year old Caucasian female, married, lives in [place], has a history of bipolar disorder, panic  attacks was evaluated in office today.  Patient is currently stable with regards to mood symptoms.  Patient however with sleep issues, snoring, possible concerns for sleep apnea, will benefit from sleep study.  Plan as noted below.  Plan Bipolar disorder  type II in remission Trileptal 900 mg p.o. twice daily  Panic disorder-stable BuSpar 10 mg p.o. twice daily.  Long-term plan to taper off. Nortriptyline 75 mg p.o. daily at bedtime Hydroxyzine 25 to 50 mg p.o. daily as needed  Insomnia-stable Nortriptyline 75 mg p.o. nightly Hydroxyzine 25-50 mg at bedtime as needed Melatonin 3 to 5 mg at bedtime as needed  Snoring-Ambulatory referral to pulmonology for sleep study.  Follow-up in clinic in 6 months or sooner if needed.  Collaboration of Care: Collaboration of Care: Other patient referred for sleep study-encouraged to schedule an appointment.  Patient/Guardian was advised Release of Information must be obtained prior to any record release in order to collaborate their care with an outside provider. Patient/Guardian was advised if they have not already done so to contact the registration department to sign all necessary forms in order for [place] to release information regarding their care.   Consent: Patient/Guardian gives verbal consent for treatment and assignment of benefits for services provided during this visit. Patient/Guardian expressed understanding and agreed to proceed.   This note was generated in part or whole with voice recognition software. Voice recognition is usually quite accurate but there are transcription errors that can and very often do occur. I apologize for any typographical errors that were not detected and corrected.    Jomarie Longs, MD 12/11/2022, 1:10 PM

## 2022-12-30 DIAGNOSIS — Z419 Encounter for procedure for purposes other than remedying health state, unspecified: Secondary | ICD-10-CM | POA: Diagnosis not present

## 2023-01-02 ENCOUNTER — Other Ambulatory Visit: Payer: Self-pay | Admitting: Psychiatry

## 2023-01-02 ENCOUNTER — Other Ambulatory Visit: Payer: Self-pay | Admitting: Internal Medicine

## 2023-01-02 DIAGNOSIS — F410 Panic disorder [episodic paroxysmal anxiety] without agoraphobia: Secondary | ICD-10-CM

## 2023-01-04 NOTE — Telephone Encounter (Signed)
Requested Prescriptions  Pending Prescriptions Disp Refills   atorvastatin (LIPITOR) 10 MG tablet [Pharmacy Med Name: Atorvastatin Calcium 10 MG Oral Tablet] 90 tablet 0    Sig: Take 1 tablet by mouth once daily     Cardiovascular:  Antilipid - Statins Failed - 01/02/2023  9:04 AM      Failed - Lipid Panel in normal range within the last 12 months    Cholesterol, Total  Date Value Ref Range Status  06/30/2015 194 100 - 199 mg/dL Final   Cholesterol  Date Value Ref Range Status  03/13/2022 158 <200 mg/dL Final   LDL Cholesterol (Calc)  Date Value Ref Range Status  03/13/2022 83 mg/dL (calc) Final    Comment:    Reference range: <100 . Desirable range <100 mg/dL for primary prevention;   <70 mg/dL for patients with CHD or diabetic patients  with > or = 2 CHD risk factors. Marland Kitchen LDL-C is now calculated using the Martin-Hopkins  calculation, which is a validated novel method providing  better accuracy than the Friedewald equation in the  estimation of LDL-C.  Horald Pollen et al. Lenox Ahr. 2440;102(72): 2061-2068  (http://education.[facility].com/faq/FAQ164)    Direct LDL  Date Value Ref Range Status  08/27/2019 144.0 mg/dL Final    Comment:    Optimal:  <100 mg/dLNear or Above Optimal:  100-129 mg/dLBorderline High:  130-159 mg/dLHigh:  160-189 mg/dLVery High:  >190 mg/dL   HDL  Date Value Ref Range Status  03/13/2022 55 > OR = 50 mg/dL Final  53/66/4403 57 >47 mg/dL Final   Triglycerides  Date Value Ref Range Status  03/13/2022 100 <150 mg/dL Final         Passed - Patient is not pregnant      Passed - Valid encounter within last 12 months    Recent Outpatient Visits           1 month ago Exposure to influenza   [facility] [place], Salvadore Oxford, NP   3 months ago PCOS (polycystic ovarian syndrome)   [facility] [place], Salvadore Oxford, NP   9 months ago Encounter for general adult medical examination with abnormal findings    [facility] [place], Salvadore Oxford, NP   1 year ago Snoring   [facility] [place], Salvadore Oxford, NP   1 year ago Upper respiratory infection with cough and congestion   [facility] [place], Salvadore Oxford, NP       Future Appointments             In 2 months Baity, Salvadore Oxford, NP [facility]

## 2023-01-21 ENCOUNTER — Institutional Professional Consult (permissible substitution): Payer: BC Managed Care – PPO | Admitting: Nurse Practitioner

## 2023-01-30 DIAGNOSIS — Z419 Encounter for procedure for purposes other than remedying health state, unspecified: Secondary | ICD-10-CM | POA: Diagnosis not present

## 2023-02-02 ENCOUNTER — Encounter: Payer: Self-pay | Admitting: Internal Medicine

## 2023-02-05 ENCOUNTER — Other Ambulatory Visit: Payer: Self-pay | Admitting: Psychiatry

## 2023-02-05 DIAGNOSIS — F410 Panic disorder [episodic paroxysmal anxiety] without agoraphobia: Secondary | ICD-10-CM

## 2023-02-05 DIAGNOSIS — F3176 Bipolar disorder, in full remission, most recent episode depressed: Secondary | ICD-10-CM

## 2023-03-01 ENCOUNTER — Encounter: Payer: Self-pay | Admitting: Nurse Practitioner

## 2023-03-01 ENCOUNTER — Ambulatory Visit (INDEPENDENT_AMBULATORY_CARE_PROVIDER_SITE_OTHER): Payer: 59 | Admitting: Nurse Practitioner

## 2023-03-01 VITALS — BP 124/84 | HR 102 | Temp 98.3°F | Resp 16 | Ht 62.0 in | Wt 271.8 lb

## 2023-03-01 DIAGNOSIS — G4719 Other hypersomnia: Secondary | ICD-10-CM | POA: Diagnosis not present

## 2023-03-01 DIAGNOSIS — E66813 Obesity, class 3: Secondary | ICD-10-CM

## 2023-03-01 DIAGNOSIS — R0683 Snoring: Secondary | ICD-10-CM

## 2023-03-01 DIAGNOSIS — G4700 Insomnia, unspecified: Secondary | ICD-10-CM

## 2023-03-01 NOTE — Progress Notes (Signed)
@Patient  ID: Jill Sandoval, female    DOB: [birth year], 33 y.o.   MRN: [id number]  Chief Complaint  Patient presents with   Consult    Referring provider: Jomarie Longs, MD  HPI: 33 year old female, former smoker referred for sleep consult. Past medical history significant for PCOS, bipolar, obesity, anxiety, insomnia.   TEST/EVENTS:   03/01/2023: Today - sleep consult Patient presents today for sleep consult, referred by Nicki Reaper, NP. She has been having trouble with her sleep for a while now. She feels tired every day.  Unable to sleep through the night.  She is typically up every 1-1/2 to 2 hours.  Wakes feeling tired in the morning.  Her husband has told her that she snores.  He is also witnessed some episodes of apnea.  She has woken up gasping for air before.  Denies any issues with drowsy driving, morning headaches or sleep parasomnia/paralysis. Goes to bed between 8 to 9 PM.  Typically falls asleep within 15 to 20 minutes.  Wakes several times at night.  Usually up around 5:45 AM.  Never had a previous sleep study.  Has gained weight over the last 2 years.  Does not operate any heavy machinery in her job Animal nutritionist.  No oxygen use.  She does take over-the-counter melatonin 5 mg which helps her fall asleep but not stay asleep. No significant cardiac disease, history of stroke or diabetes. She is a never smoker.  Does use smokeless tobacco.  Does not drink alcohol.  No excessive caffeine intake.  Lives with her husband and 2 kids.  Works as a Dance movement psychotherapist. Family history of cancer.  Epworth 7  No Known Allergies  Immunization History  Administered Date(s) Administered   Influenza Split 11/22/2011   Influenza-Unspecified 11/29/2016, 10/29/2017, 11/06/2018   PFIZER(Purple Top)SARS-COV-2 Vaccination 02/17/2019   PPD Test 07/18/2020   Pneumococcal Polysaccharide-23 03/26/2012   Tdap 03/25/2012, 08/02/2016    Past Medical History:  Diagnosis Date   Anxiety and  depression    Asthma    inhaler prn   Fetal demise > 22 weeks, delivered, current hospitalization 11/04/2015   Miscarriage     Tobacco History: Social History   Tobacco Use  Smoking Status Former   Current packs/day: 0.00   Types: Cigarettes   Quit date: 11/14/2018   Years since quitting: 4.2   Passive exposure: Past  Smokeless Tobacco Never   Counseling given: Not Answered   Outpatient Medications Prior to Visit  Medication Sig Dispense Refill   atorvastatin (LIPITOR) 10 MG tablet Take 1 tablet by mouth once daily 90 tablet 0   busPIRone (BUSPAR) 10 MG tablet Take 1 tablet by mouth twice daily 180 tablet 0   hydrOXYzine (VISTARIL) 25 MG capsule Take 1-2 capsules (25-50 mg total) by mouth as directed. Take for severe panic attacks 60 capsule 1   nortriptyline (PAMELOR) 75 MG capsule Take 1 capsule by mouth at bedtime 90 capsule 0   Oxcarbazepine (TRILEPTAL) 300 MG tablet TAKE 3 TABLETS BY MOUTH TWICE DAILY 540 tablet 3   nicotine (NICODERM CQ - DOSED IN MG/24 HOURS) 14 mg/24hr patch Place 1 patch (14 mg total) onto the skin daily. 28 patch 0   nicotine polacrilex (NICORETTE) 2 MG gum Take 1 each (2 mg total) by mouth as needed for smoking cessation. 100 tablet 0   YAZ 3-0.02 MG tablet Take 1 tablet by mouth daily. 84 tablet 1   No facility-administered medications prior to visit.  Review of Systems:   Constitutional: No night sweats, fevers, chills, or lassitude. +fatigue, weight gain  HEENT: No headaches, difficulty swallowing, tooth/dental problems, or sore throat. No sneezing, itching, ear ache, nasal congestion, or post nasal drip CV:  No chest pain, orthopnea, PND, swelling in lower extremities, anasarca, dizziness, palpitations, syncope Resp: +snoring, witnessed apneas; baseline shortness of breath with exertion. No excess mucus or change in color of mucus. No productive or non-productive. No hemoptysis. No wheezing.  No chest wall deformity GI:  No heartburn,  indigestion GU: No nocturia  Skin: No rash, lesions, ulcerations MSK:  No joint pain or swelling.   Neuro: No dizziness or lightheadedness.  Psych: No depression. +stable anxiety. Mood stable. +sleep disturbance     Physical Exam:  BP 124/84   Pulse (!) 102   Temp 98.3 F (36.8 C) (Temporal)   Resp 16   Ht 5\' 2"  (1.575 m)   Wt 271 lb 12.8 oz (123.3 kg)   SpO2 99%   BMI 49.71 kg/m   GEN: Pleasant, interactive, well-appearing; morbidly obese; in no acute distress HEENT:  Normocephalic and atraumatic. PERRLA. Sclera white. Nasal turbinates pink, moist and patent bilaterally. No rhinorrhea present. Oropharynx pink and moist, without exudate or edema. No lesions, ulcerations, or postnasal drip. Mallampati III NECK:  Supple w/ fair ROM. No JVD present. Normal carotid impulses w/o bruits. Thyroid symmetrical with no goiter or nodules palpated. No lymphadenopathy.   CV: RRR, no m/r/g, no peripheral edema. Pulses intact, +2 bilaterally. No cyanosis, pallor or clubbing. PULMONARY:  Unlabored, regular breathing. Clear bilaterally A&P w/o wheezes/rales/rhonchi. No accessory muscle use.  GI: BS present and normoactive. Soft, non-tender to palpation. No organomegaly or masses detected.  MSK: No erythema, warmth or tenderness. Cap refil <2 sec all extrem. No deformities or joint swelling noted.  Neuro: A/Ox3. No focal deficits noted.   Skin: Warm, no lesions or rashe Psych: Normal affect and behavior. Judgement and thought content appropriate.     Lab Results:  CBC    Component Value Date/Time   WBC 6.0 03/13/2022 0901   RBC 4.51 03/13/2022 0901   HGB 12.9 03/13/2022 0901   HGB 12.6 06/30/2015 0000   HCT 38.5 03/13/2022 0901   HCT 38.1 06/30/2015 0000   PLT 266 03/13/2022 0901   PLT 320 06/30/2015 0000   MCV 85.4 03/13/2022 0901   MCV 84 06/30/2015 0000   MCV 89 07/28/2011 1558   MCH 28.6 03/13/2022 0901   MCHC 33.5 03/13/2022 0901   RDW 12.8 03/13/2022 0901   RDW 13.9  06/30/2015 0000   RDW 13.3 07/28/2011 1558   LYMPHSABS 2.2 06/09/2020 0946   LYMPHSABS 2.4 06/30/2015 0000   MONOABS 0.4 06/09/2020 0946   EOSABS 0.3 06/09/2020 0946   EOSABS 0.2 06/30/2015 0000   BASOSABS 0.0 06/09/2020 0946   BASOSABS 0.0 06/30/2015 0000    BMET    Component Value Date/Time   NA 141 03/13/2022 0901   NA 136 06/30/2015 0000   NA 137 07/28/2011 1558   K 4.3 03/13/2022 0901   K 3.7 07/28/2011 1558   CL 105 03/13/2022 0901   CL 107 07/28/2011 1558   CO2 24 03/13/2022 0901   CO2 22 07/28/2011 1558   GLUCOSE 90 03/13/2022 0901   GLUCOSE 76 07/28/2011 1558   BUN 14 03/13/2022 0901   BUN 9 06/30/2015 0000   BUN 11 07/28/2011 1558   CREATININE 0.95 03/13/2022 0901   CALCIUM 9.3 03/13/2022 0901   CALCIUM 9.1 07/28/2011  1558   GFRNONAA >60 06/09/2020 0946   GFRNONAA >60 07/28/2011 1558   GFRAA 136 06/30/2015 0000   GFRAA >60 07/28/2011 1558    BNP No results found for: "BNP"   Imaging:  No results found.  Administration History     None           No data to display          No results found for: "NITRICOXIDE"      Assessment & Plan:   Excessive daytime sleepiness She has snoring, excessive daytime sleepiness, nocturnal apneic events, restless sleep. BMI 49. Given this,  I am concerned she could have sleep disordered breathing with obstructive sleep apnea. She will need sleep study for further evaluation.    - discussed how weight can impact sleep and risk for sleep disordered breathing - discussed options to assist with weight loss: combination of diet modification, cardiovascular and strength training exercises   - had an extensive discussion regarding the adverse health consequences related to untreated sleep disordered breathing - specifically discussed the risks for hypertension, coronary artery disease, cardiac dysrhythmias, cerebrovascular disease, and diabetes - lifestyle modification discussed   - discussed how sleep  disruption can increase risk of accidents, particularly when driving - safe driving practices were discussed  Patient Instructions  Given your symptoms, I am concerned that you may have sleep disordered breathing with sleep apnea. You will need a sleep study for further evaluation. Someone will contact you to schedule this.   We discussed how untreated sleep apnea puts an individual at risk for cardiac arrhthymias, pulm HTN, DM, stroke and increases their risk for daytime accidents. We also briefly reviewed treatment options including weight loss, side sleeping position, oral appliance, CPAP therapy or referral to ENT for possible surgical options  Use caution when driving and pull over if you become sleepy.  Follow up in 6 weeks with Katie Ashleymarie Granderson,NP to go over sleep study results, or sooner, if needed. Friday PM virtual clinic    Class 3 severe obesity due to excess calories with body mass index (BMI) of 45.0 to 49.9 in adult (HCC) BMI 49. Healthy weight loss measures encouraged  Snoring See above  Insomnia See above. Sleep hygiene reviewed. Possibly due to untreated OSA. Assess with sleep study and determine use of pharmacological therapy in future.    Advised if symptoms do not improve or worsen, to please contact office for sooner follow up or seek emergency care.   I spent 35 minutes of dedicated to the care of this patient on the date of this encounter to include pre-visit review of records, face-to-face time with the patient discussing conditions above, post visit ordering of testing, clinical documentation with the electronic health record, making appropriate referrals as documented, and communicating necessary findings to members of the patients care team.  Noemi Chapel, NP 03/01/2023  Pt aware and understands NP's role.

## 2023-03-01 NOTE — Assessment & Plan Note (Signed)
 See above

## 2023-03-01 NOTE — Assessment & Plan Note (Addendum)
BMI 49. Healthy weight loss measures encouraged

## 2023-03-01 NOTE — Progress Notes (Signed)
Epworth Sleepiness Scale  Use the following scale to choose the most appropriate number for each situation. 0 Would never nod off 1  Slight  chance of nodding off 2 Moderate chance of nodding off 3 High chance of nodding off  Sitting and reading: 2 Watching TV: 1 Sitting, inactive, in a public place (e.g., in a meeting, theater, or dinner event): 1 As a passenger in a car for an hour or more without stopping for a break: 0 Lying down to rest when circumstances permit:3 Sitting and talking to someone: 0 Sitting quietly after a meal without alcohol: 0 In a car, while stopped for a few  minutes in traffic or at a light: 0  TOTOAL: 7

## 2023-03-01 NOTE — Assessment & Plan Note (Signed)
She has snoring, excessive daytime sleepiness, nocturnal apneic events, restless sleep. BMI 49. Given this,  I am concerned she could have sleep disordered breathing with obstructive sleep apnea. She will need sleep study for further evaluation.    - discussed how weight can impact sleep and risk for sleep disordered breathing - discussed options to assist with weight loss: combination of diet modification, cardiovascular and strength training exercises   - had an extensive discussion regarding the adverse health consequences related to untreated sleep disordered breathing - specifically discussed the risks for hypertension, coronary artery disease, cardiac dysrhythmias, cerebrovascular disease, and diabetes - lifestyle modification discussed   - discussed how sleep disruption can increase risk of accidents, particularly when driving - safe driving practices were discussed  Patient Instructions  Given your symptoms, I am concerned that you may have sleep disordered breathing with sleep apnea. You will need a sleep study for further evaluation. Someone will contact you to schedule this.   We discussed how untreated sleep apnea puts an individual at risk for cardiac arrhthymias, pulm HTN, DM, stroke and increases their risk for daytime accidents. We also briefly reviewed treatment options including weight loss, side sleeping position, oral appliance, CPAP therapy or referral to ENT for possible surgical options  Use caution when driving and pull over if you become sleepy.  Follow up in 6 weeks with Katie Cherrelle Plante,NP to go over sleep study results, or sooner, if needed. Friday PM virtual clinic

## 2023-03-01 NOTE — Assessment & Plan Note (Addendum)
See above. Sleep hygiene reviewed. Possibly due to untreated OSA. Assess with sleep study and determine use of pharmacological therapy in future.

## 2023-03-01 NOTE — Patient Instructions (Addendum)
Given your symptoms, I am concerned that you may have sleep disordered breathing with sleep apnea. You will need a sleep study for further evaluation. Someone will contact you to schedule this.   We discussed how untreated sleep apnea puts an individual at risk for cardiac arrhthymias, pulm HTN, DM, stroke and increases their risk for daytime accidents. We also briefly reviewed treatment options including weight loss, side sleeping position, oral appliance, CPAP therapy or referral to ENT for possible surgical options  Use caution when driving and pull over if you become sleepy.  Follow up in 6 weeks with Katie Keene Gilkey,NP to go over sleep study results, or sooner, if needed. Friday PM virtual clinic

## 2023-03-02 DIAGNOSIS — Z419 Encounter for procedure for purposes other than remedying health state, unspecified: Secondary | ICD-10-CM | POA: Diagnosis not present

## 2023-03-19 ENCOUNTER — Ambulatory Visit (INDEPENDENT_AMBULATORY_CARE_PROVIDER_SITE_OTHER): Payer: PRIVATE HEALTH INSURANCE | Admitting: Internal Medicine

## 2023-03-19 ENCOUNTER — Encounter: Payer: Self-pay | Admitting: Internal Medicine

## 2023-03-19 VITALS — BP 124/80 | Ht 62.0 in | Wt 274.0 lb

## 2023-03-19 DIAGNOSIS — R739 Hyperglycemia, unspecified: Secondary | ICD-10-CM

## 2023-03-19 DIAGNOSIS — Z0001 Encounter for general adult medical examination with abnormal findings: Secondary | ICD-10-CM | POA: Diagnosis not present

## 2023-03-19 DIAGNOSIS — R110 Nausea: Secondary | ICD-10-CM | POA: Diagnosis not present

## 2023-03-19 DIAGNOSIS — R1011 Right upper quadrant pain: Secondary | ICD-10-CM

## 2023-03-19 DIAGNOSIS — E782 Mixed hyperlipidemia: Secondary | ICD-10-CM

## 2023-03-19 NOTE — Assessment & Plan Note (Signed)
 Encourage diet and exercise for weight loss

## 2023-03-19 NOTE — Patient Instructions (Signed)

## 2023-03-19 NOTE — Progress Notes (Signed)
Subjective:    Patient ID: Jill Sandoval, female    DOB: 1990/03/04, 33 y.o.   MRN: [id number]  HPI  Patient presents to clinic today for her annual exam.    Flu: 10/2018 Tetanus: 07/2016 Pneumovax: 03/2012 COVID: Pfizer x 1 Pap smear: 07/2020 Dentist: biannually  Diet: She does eat meat. She consumes some fruits and veggies. She does eat some fried foods. She drinks mostly tea or caffeine free soda. Exercise: None  Review of Systems     Past Medical History:  Diagnosis Date   Anxiety and depression    Asthma    inhaler prn   Fetal demise > 22 weeks, delivered, current hospitalization 11/04/2015   Miscarriage     Current Outpatient Medications  Medication Sig Dispense Refill   atorvastatin (LIPITOR) 10 MG tablet Take 1 tablet by mouth once daily 90 tablet 0   busPIRone (BUSPAR) 10 MG tablet Take 1 tablet by mouth twice daily 180 tablet 0   hydrOXYzine (VISTARIL) 25 MG capsule Take 1-2 capsules (25-50 mg total) by mouth as directed. Take for severe panic attacks 60 capsule 1   nortriptyline (PAMELOR) 75 MG capsule Take 1 capsule by mouth at bedtime 90 capsule 0   Oxcarbazepine (TRILEPTAL) 300 MG tablet TAKE 3 TABLETS BY MOUTH TWICE DAILY 540 tablet 3   No current facility-administered medications for this visit.    No Known Allergies  Family History  Problem Relation Age of Onset   Healthy Mother    Hypertension Father    Hearing loss Brother    Hypertension Maternal Grandmother    Prostate cancer Maternal Grandfather    Hearing loss Paternal Grandfather    Other Neg Hx     Social History   Socioeconomic History   Marital status: Married    Spouse name: Not on file   Number of children: 2   Years of education: Not on file   Highest education level: GED or equivalent  Occupational History   Not on file  Tobacco Use   Smoking status: Former    Current packs/day: 0.00    Types: Cigarettes    Quit date: 11/14/2018    Years since quitting: 4.3     Passive exposure: Past   Smokeless tobacco: Never  Vaping Use   Vaping status: Some Days  Substance and Sexual Activity   Alcohol use: No   Drug use: No   Sexual activity: Yes    Birth control/protection: None  Other Topics Concern   Not on file  Social History Narrative   Married.   2 children.   Works at [employer].   Enjoys camping, being outdoors.    Social Drivers of Corporate investment banker Strain: Low Risk  (03/18/2023)   Overall Financial Resource Strain (CARDIA)    Difficulty of Paying Living Expenses: Not very hard  Food Insecurity: No Food Insecurity (03/18/2023)   Hunger Vital Sign    Worried About Running Out of Food in the Last Year: Never true    Ran Out of Food in the Last Year: Never true  Transportation Needs: No Transportation Needs (03/18/2023)   PRAPARE - Administrator, Civil Service (Medical): No    Lack of Transportation (Non-Medical): No  Physical Activity: Insufficiently Active (03/18/2023)   Exercise Vital Sign    Days of Exercise per Week: 4 days    Minutes of Exercise per Session: 10 min  Stress: No Stress Concern Present (03/18/2023)   [employer] of  Occupational Health - Occupational Stress Questionnaire    Feeling of Stress : Not at all  Social Connections: Moderately Isolated (03/18/2023)   Social Connection and Isolation Panel [NHANES]    Frequency of Communication with Friends and Family: More than three times a week    Frequency of Social Gatherings with Friends and Family: More than three times a week    Attends Religious Services: Never    Database administrator or Organizations: No    Attends Engineer, structural: Not on file    Marital Status: Married  Catering manager Violence: Not on file     Constitutional: Patient reports intermittent headaches.  Denies fever, malaise, fatigue, or abrupt weight changes.  HEENT: Denies eye pain, eye redness, ear pain, ringing in the ears, wax buildup, runny nose, nasal  congestion, bloody nose, or sore throat. Respiratory: Denies difficulty breathing, shortness of breath, cough or sputum production.   Cardiovascular: Denies chest pain, chest tightness, palpitations or swelling in the hands or feet.  Gastrointestinal: Pt reports intermittent RUQ pain, nausea. Denies abdominal pain, bloating, constipation, diarrhea or blood in the stool.  GU: Denies urgency, frequency, pain with urination, burning sensation, blood in urine, odor or discharge. Musculoskeletal: Denies decrease in range of motion, difficulty with gait, muscle pain or joint pain and swelling.  Skin: Denies redness, rashes, lesions or ulcercations.  Neurological: Patient reports insomnia.  Denies dizziness, difficulty with memory, difficulty with speech or problems with balance and coordination.  Psych: Patient has a history of anxiety and depression.  Denies SI/HI.  No other specific complaints in a complete review of systems (except as listed in HPI above).  Objective:   Physical Exam BP 124/80   Ht 5\' 2"  (1.575 m)   Wt 274 lb (124.3 kg)   BMI 50.12 kg/m    Wt Readings from Last 3 Encounters:  03/01/23 271 lb 12.8 oz (123.3 kg)  11/14/22 271 lb (122.9 kg)  09/20/22 272 lb (123.4 kg)    General: Appears her stated age, obese in NAD. Skin: Warm, dry and intact.  HEENT: Head: normal shape and size; Eyes: sclera white, no icterus, conjunctiva pink, PERRLA and EOMs intact;  Neck:  Neck supple, trachea midline. No masses, lumps or thyromegaly present.  Cardiovascular: Normal rate and rhythm. S1,S2 noted.  No murmur, rubs or gallops noted. No JVD or BLE edema. No carotid bruits noted. Pulmonary/Chest: Normal effort and positive vesicular breath sounds. No respiratory distress. No wheezes, rales or ronchi noted.  Abdomen:  Normal bowel sounds.  Musculoskeletal: Strength 5/5 BUE/BLE.  No difficulty with gait.  Neurological: Alert and oriented. Cranial nerves II-XII grossly intact. Coordination  normal.  Psychiatric: Mood and affect normal. Behavior is normal. Judgment and thought content normal.    BMET    Component Value Date/Time   NA 141 03/13/2022 0901   NA 136 06/30/2015 0000   NA 137 07/28/2011 1558   K 4.3 03/13/2022 0901   K 3.7 07/28/2011 1558   CL 105 03/13/2022 0901   CL 107 07/28/2011 1558   CO2 24 03/13/2022 0901   CO2 22 07/28/2011 1558   GLUCOSE 90 03/13/2022 0901   GLUCOSE 76 07/28/2011 1558   BUN 14 03/13/2022 0901   BUN 9 06/30/2015 0000   BUN 11 07/28/2011 1558   CREATININE 0.95 03/13/2022 0901   CALCIUM 9.3 03/13/2022 0901   CALCIUM 9.1 07/28/2011 1558   GFRNONAA >60 06/09/2020 0946   GFRNONAA >60 07/28/2011 1558   GFRAA 136 06/30/2015  0000   GFRAA >60 07/28/2011 1558    Lipid Panel     Component Value Date/Time   CHOL 158 03/13/2022 0901   CHOL 194 06/30/2015 0000   TRIG 100 03/13/2022 0901   HDL 55 03/13/2022 0901   HDL 57 06/30/2015 0000   CHOLHDL 2.9 03/13/2022 0901   VLDL 20 06/14/2020 0927   LDLCALC 83 03/13/2022 0901    CBC    Component Value Date/Time   WBC 6.0 03/13/2022 0901   RBC 4.51 03/13/2022 0901   HGB 12.9 03/13/2022 0901   HGB 12.6 06/30/2015 0000   HCT 38.5 03/13/2022 0901   HCT 38.1 06/30/2015 0000   PLT 266 03/13/2022 0901   PLT 320 06/30/2015 0000   MCV 85.4 03/13/2022 0901   MCV 84 06/30/2015 0000   MCV 89 07/28/2011 1558   MCH 28.6 03/13/2022 0901   MCHC 33.5 03/13/2022 0901   RDW 12.8 03/13/2022 0901   RDW 13.9 06/30/2015 0000   RDW 13.3 07/28/2011 1558   LYMPHSABS 2.2 06/09/2020 0946   LYMPHSABS 2.4 06/30/2015 0000   MONOABS 0.4 06/09/2020 0946   EOSABS 0.3 06/09/2020 0946   EOSABS 0.2 06/30/2015 0000   BASOSABS 0.0 06/09/2020 0946   BASOSABS 0.0 06/30/2015 0000    Hgb A1C Lab Results  Component Value Date   HGBA1C 5.3 03/13/2022            Assessment & Plan:   Preventative Health Maintenance:  Flu shot declined Tetanus UTD Pneumovax declined Encouraged her to get her COVID  booster Pap smear UTD Encouraged her to consume a balanced diet and exercise regimen Advised her to see a dentist annually We will check CBC, c-Met, lipid, A1c today  RUQ abdominal pain, nausea:  She has concerns about gallstones and cholecystitis CT abdomen/pelvis from 2019 did not show any evidence of gallstones at that time We will obtain RUQ abdominal ultrasound for further evaluation  RTC in 6 months, follow-up chronic conditions Nicki Reaper, NP

## 2023-03-20 ENCOUNTER — Encounter: Payer: Self-pay | Admitting: Internal Medicine

## 2023-03-20 LAB — CBC
HCT: 40.8 % (ref 35.0–45.0)
Hemoglobin: 13.6 g/dL (ref 11.7–15.5)
MCH: 28.2 pg (ref 27.0–33.0)
MCHC: 33.3 g/dL (ref 32.0–36.0)
MCV: 84.5 fL (ref 80.0–100.0)
MPV: 10.5 fL (ref 7.5–12.5)
Platelets: 308 10*3/uL (ref 140–400)
RBC: 4.83 10*6/uL (ref 3.80–5.10)
RDW: 13 % (ref 11.0–15.0)
WBC: 9.1 10*3/uL (ref 3.8–10.8)

## 2023-03-20 LAB — COMPLETE METABOLIC PANEL WITH GFR
AG Ratio: 2.3 (calc) (ref 1.0–2.5)
ALT: 19 U/L (ref 6–29)
AST: 11 U/L (ref 10–30)
Albumin: 4.9 g/dL (ref 3.6–5.1)
Alkaline phosphatase (APISO): 87 U/L (ref 31–125)
BUN: 16 mg/dL (ref 7–25)
CO2: 25 mmol/L (ref 20–32)
Calcium: 9.9 mg/dL (ref 8.6–10.2)
Chloride: 104 mmol/L (ref 98–110)
Creat: 0.78 mg/dL (ref 0.50–0.97)
Globulin: 2.1 g/dL (ref 1.9–3.7)
Glucose, Bld: 92 mg/dL (ref 65–99)
Potassium: 4.4 mmol/L (ref 3.5–5.3)
Sodium: 138 mmol/L (ref 135–146)
Total Bilirubin: 0.2 mg/dL (ref 0.2–1.2)
Total Protein: 7 g/dL (ref 6.1–8.1)
eGFR: 103 mL/min/{1.73_m2} (ref >=60)

## 2023-03-20 LAB — LIPID PANEL
Cholesterol: 232 mg/dL — ABNORMAL HIGH (ref <200)
HDL: 69 mg/dL (ref >=50)
LDL Cholesterol (Calc): 132 mg/dL — ABNORMAL HIGH
Non-HDL Cholesterol (Calc): 163 mg/dL — ABNORMAL HIGH (ref <130)
Total CHOL/HDL Ratio: 3.4 (calc) (ref <5.0)
Triglycerides: 176 mg/dL — ABNORMAL HIGH (ref <150)

## 2023-03-20 LAB — HEMOGLOBIN A1C
Hgb A1c MFr Bld: 5.1 %{Hb} (ref <5.7)
Mean Plasma Glucose: 100 mg/dL
eAG (mmol/L): 5.5 mmol/L

## 2023-03-30 DIAGNOSIS — Z419 Encounter for procedure for purposes other than remedying health state, unspecified: Secondary | ICD-10-CM | POA: Diagnosis not present

## 2023-04-01 ENCOUNTER — Other Ambulatory Visit: Payer: PRIVATE HEALTH INSURANCE

## 2023-04-09 ENCOUNTER — Ambulatory Visit
Admission: RE | Admit: 2023-04-09 | Discharge: 2023-04-09 | Disposition: A | Discharge status: Home / Self Care | Payer: PRIVATE HEALTH INSURANCE | Source: Ambulatory Visit | Attending: Internal Medicine | Admitting: Internal Medicine

## 2023-04-09 DIAGNOSIS — R110 Nausea: Secondary | ICD-10-CM | POA: Insufficient documentation

## 2023-04-09 DIAGNOSIS — R1011 Right upper quadrant pain: Secondary | ICD-10-CM | POA: Insufficient documentation

## 2023-04-10 ENCOUNTER — Encounter: Payer: Self-pay | Admitting: Internal Medicine

## 2023-05-01 ENCOUNTER — Other Ambulatory Visit: Payer: Self-pay | Admitting: Psychiatry

## 2023-05-01 DIAGNOSIS — F410 Panic disorder [episodic paroxysmal anxiety] without agoraphobia: Secondary | ICD-10-CM

## 2023-05-02 ENCOUNTER — Other Ambulatory Visit: Payer: Self-pay | Admitting: Psychiatry

## 2023-05-02 DIAGNOSIS — F410 Panic disorder [episodic paroxysmal anxiety] without agoraphobia: Secondary | ICD-10-CM

## 2023-05-02 DIAGNOSIS — F3176 Bipolar disorder, in full remission, most recent episode depressed: Secondary | ICD-10-CM

## 2023-06-02 ENCOUNTER — Other Ambulatory Visit: Payer: Self-pay | Admitting: Internal Medicine

## 2023-06-03 ENCOUNTER — Ambulatory Visit (INDEPENDENT_AMBULATORY_CARE_PROVIDER_SITE_OTHER): Payer: PRIVATE HEALTH INSURANCE | Admitting: Family Medicine

## 2023-06-03 ENCOUNTER — Encounter: Payer: Self-pay | Admitting: Family Medicine

## 2023-06-03 ENCOUNTER — Other Ambulatory Visit: Payer: Self-pay

## 2023-06-03 ENCOUNTER — Ambulatory Visit: Payer: Self-pay

## 2023-06-03 ENCOUNTER — Other Ambulatory Visit: Payer: Self-pay | Admitting: Internal Medicine

## 2023-06-03 VITALS — BP 122/80 | HR 111 | Temp 98.1°F | Ht 62.0 in | Wt 273.4 lb

## 2023-06-03 DIAGNOSIS — J020 Streptococcal pharyngitis: Secondary | ICD-10-CM

## 2023-06-03 LAB — POCT RAPID STREP A (OFFICE): Rapid Strep A Screen: POSITIVE — AB

## 2023-06-03 MED ORDER — AMOXICILLIN 500 MG PO CAPS: 500.0000 mg | ORAL_CAPSULE | Freq: Two times a day (BID) | ORAL | 0 refills | Status: DC

## 2023-06-03 MED ORDER — AMOXICILLIN 500 MG PO CAPS
500.0000 mg | ORAL_CAPSULE | Freq: Two times a day (BID) | ORAL | 0 refills | Status: DC
Start: 2023-06-03 — End: 2023-07-04

## 2023-06-03 NOTE — Progress Notes (Signed)
 Subjective:    Patient ID: Jill Sandoval, female    DOB: 1990-02-05, 33 y.o.   MRN: [id number]  Jill Sandoval is a 33 y.o. female presenting on 06/03/2023 for Sore Throat  PCP Helayne Lo, FNP   Patient presents for a same day appointment.   HPI  Discussed the use of AI scribe software for clinical note transcription with the patient, who gave verbal consent to proceed.  History of Present Illness   Jill Sandoval is a 33 year old female who presents with a sore throat and left ear pain.  She has been experiencing a sore throat and left ear pain for two days. The ear pain is throbbing and worsens at night, while the throat pain radiates to the ear. She also has a low-grade fever, with a recorded temperature of 101F yesterday.  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 She is concerned about returning to work and inquires about the appropriate duration of antibiotic treatment before it is safe to return.  She has been taking ibuprofen , two 200 mg tablets at a time, over the weekend to manage the pain, which she finds somewhat effective. No allergies to antibiotics. No mention of other over-the-counter medications or treatments.  Congestion has been present since the onset of symptoms. Her glands were sore yesterday but are less so today.          03/19/2023    8:35 AM 12/11/2022   11:26 AM 11/14/2022    8:51 AM  Depression screen PHQ 2/9  Decreased Interest 0  0  Down, Depressed, Hopeless 0  0  PHQ - 2 Score 0  0  Altered sleeping 1  1  Tired, decreased energy 1  1  Change in appetite 0  0  Feeling bad or failure about yourself  0  0  Trouble concentrating 1  0  Moving slowly or fidgety/restless 0  0  Suicidal thoughts 0  0  PHQ-9 Score 3  2  Difficult doing work/chores        Information is confidential and restricted. Go to Review Flowsheets to unlock data.       03/19/2023    8:35 AM 12/11/2022   11:26 AM 11/14/2022    8:52 AM  09/20/2022    9:02 AM  GAD 7 : Generalized Anxiety Score  Nervous, Anxious, on Edge 0  1 0  Control/stop worrying 0  0 0  Worry too much - different things 0  0 0  Trouble relaxing 0  0 0  Restless 0  0 0  Easily annoyed or irritable 1  1 0  Afraid - awful might happen 0  0 0  Total GAD 7 Score 1  2 0  Anxiety Difficulty Not difficult at all   Not difficult at all     Information is confidential and restricted. Go to Review Flowsheets to unlock data.    Social History   Tobacco Use   Smoking status: Former    Current packs/day: 0.00    Types: Cigarettes    Quit date: 11/14/2018    Years since quitting: 4.5    Passive exposure: Past   Smokeless tobacco: Never  Vaping Use   Vaping status: Some Days  Substance Use Topics   Alcohol use: No   Drug use: No    Review of Systems Per HPI unless specifically indicated above     Objective:    BP 122/80 (  BP Location: Left Arm, Patient Position: Sitting, Cuff Size: Large)   Pulse (!) 111   Temp 98.1 F (36.7 C) (Oral)   Ht 5\' 2"  (1.575 m)   Wt 273 lb 6 oz (124 kg)   SpO2 99%   BMI 50.00 kg/m   Wt Readings from Last 3 Encounters:  06/03/23 273 lb 6 oz (124 kg)  03/19/23 274 lb (124.3 kg)  03/01/23 271 lb 12.8 oz (123.3 kg)    Physical Exam Vitals and nursing note reviewed.  Constitutional:      General: She is not in acute distress.    Appearance: Normal appearance. She is well-developed. She is not diaphoretic.     Comments: Well-appearing, comfortable, cooperative  HENT:     Head: Normocephalic and atraumatic.     Right Ear: Ear canal and external ear normal. There is no impacted cerumen.     Left Ear: Ear canal and external ear normal. There is no impacted cerumen.     Ears:     Comments: Clear fluid effusion with some pressure without bulging L>R. No erythema.    Mouth/Throat:     Mouth: Mucous membranes are moist.     Pharynx: Posterior oropharyngeal erythema present. No oropharyngeal exudate.  Eyes:      General:        Right eye: No discharge.        Left eye: No discharge.     Conjunctiva/sclera: Conjunctivae normal.  Cardiovascular:     Rate and Rhythm: Normal rate.  Pulmonary:     Effort: Pulmonary effort is normal. No respiratory distress.     Breath sounds: Normal breath sounds. No stridor. No wheezing, rhonchi or rales.  Musculoskeletal:     Cervical back: Normal range of motion. No tenderness.  Lymphadenopathy:     Cervical: No cervical adenopathy.  Skin:    General: Skin is warm and dry.     Findings: No erythema or rash.  Neurological:     Mental Status: She is alert and oriented to person, place, and time.  Psychiatric:        Mood and Affect: Mood normal.        Behavior: Behavior normal.        Thought Content: Thought content normal.     Comments: Well groomed, good eye contact, normal speech and thoughts     Results for orders placed or performed in visit on 06/03/23  POCT rapid strep A   Collection Time: 06/03/23 11:25 AM  Result Value Ref Range   Rapid Strep A Screen Positive (A) Negative      Assessment & Plan:   Problem List Items Addressed This Visit   None Visit Diagnoses       Strep pharyngitis    -  Primary   Relevant Medications   amoxicillin (AMOXIL) 500 MG capsule   Other Relevant Orders   POCT rapid strep A (Completed)       Acute Streptococcal pharyngitis Acute streptococcal pharyngitis confirmed by positive strep test. Antibiotic treatment indicated to prevent complications and reduce transmission risk. Advised fever-free for 24-48 hours without antipyretics before returning to work.  - Prescribe amoxicillin 500 TWICE A DAY x 10 days - Provide work notes for return on Wednesday or Thursday, depending on symptom resolution.  Left ear pain Left ear pain likely secondary to referred pain from streptococcal pharyngitis. Or ear effusion pressure No signs of ear infection. - Continue OTC ibuprofen  600 mg three times a day as needed  for  pain relief.        Orders Placed This Encounter  Procedures   POCT rapid strep A    Meds ordered this encounter  Medications   amoxicillin (AMOXIL) 500 MG capsule    Sig: Take 1 capsule (500 mg total) by mouth 2 (two) times daily. For 10 days    Dispense:  20 capsule    Refill:  0    Follow up plan: Return if symptoms worsen or fail to improve.   Domingo Friend, DO [facility] 06/03/2023, 11:19 AM

## 2023-06-03 NOTE — Telephone Encounter (Signed)
 Copied from CRM [id number]. Topic: Clinical - Prescription Issue >> Jun 03, 2023  1:18 PM Phil Braun wrote: Reason for CRM:   Ref amoxicillin (AMOXIL) 500 MG capsule   Pharmacy is stating that they have not received the [facility] request for this medicine. Please resend to another pharmacy for pt.    [facility] [address] [place] [address] Phone [phone]

## 2023-06-03 NOTE — Telephone Encounter (Signed)
 Chief Complaint: Sore throat, earache Symptoms: see above Frequency: since Saturday Pertinent Negatives: Patient denies cough Disposition: [] ED /[] Urgent Care (no appt availability in office) / [x] Appointment(In office/virtual)/ []  [place] [facility]/ [] Home Care/ [] Refused Recommended Disposition /[] [place] [facility]/ []  Follow-up with PCP Additional Notes: Patient called in asking for an appt for evaluation for a sore throat, left earache, and congestion since Saturday. 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 Patient was running a 101 fever on Saturday and a low grade fever yesterday, but no fever today. Patient appt made for evaluation for today.    Reason for Disposition  Earache also present  Answer Assessment - Initial Assessment Questions 1. ONSET: "When did the throat start hurting?" (Hours or days ago)      Saturday morning 2. SEVERITY: "How bad is the sore throat?" (Scale 1-10; mild, moderate or severe)   - MILD (1-3):  Doesn't interfere with eating or normal activities.   - MODERATE (4-7): Interferes with eating some solids and normal activities.   - SEVERE (8-10):  Excruciating pain, interferes with most normal activities.   - SEVERE WITH DYSPHAGIA (10): Can't swallow liquids, drooling.     Moderate - hurts to swallow 3. STREP EXPOSURE: "Has there been any exposure to strep within the past week?" If Yes, ask: "What type of contact occurred?"      Yes exposed to kid in daycare with strep last week 4.  VIRAL SYMPTOMS: "Are there any symptoms of a cold, such as a runny nose, cough, hoarse voice or red eyes?"      No 5. FEVER: "Do you have a fever?" If Yes, ask: "What is your temperature, how was it measured, and when did it start?"     Had a fever on Saturday 101, low grade yesterday  6. PUS ON THE TONSILS: "Is there pus on the tonsils in the back of your throat?"     Husband did not notice  7. OTHER SYMPTOMS: "Do you have  any other symptoms?" (e.g., difficulty breathing, headache, rash)     Earache in just left ear, congestion 8. PREGNANCY: "Is there any chance you are pregnant?" "When was your last menstrual period?"     No  Protocols used: Sore Throat-A-AH

## 2023-06-03 NOTE — Patient Instructions (Addendum)
 Thank you for coming to the office today.  You have Strep Throat Pharyngitis, rapid strep swab test was POSITIVE today - Take antibiotic Amoxicillin 500mg  twice a day for 10 days, finish complete course  - Take regular NSAID with either Aleve 1-2 pills twice a day OR Ibuprofen  400 to 600mg  per dose with food every 6-8 hours or 3 times a day for next 3 to 5 days regularly, then as needed only - Take Tylenol  500-1000mg  per dose as needed every 8 hours or 3 times a day between for pain, fevers, or chills - Drink extra clear fluids (water, or G2 gatorade), try colder soft foods if needed otherwise regular diet - Drink warm herbal tea with honey for sore throat   There is a chance that this is more of a Viral Pharyngitis, in which case it will take time to run it's course regardless, and may have some hoarseness or throat pain lasting for up to 7-10 days then improve   Please schedule a Follow-up Appointment to: Return if symptoms worsen or fail to improve.  If you have any other questions or concerns, please feel free to call the office or send a message through MyChart. You may also schedule an earlier appointment if necessary.  Additionally, you may be receiving a survey about your experience at our office within a few days to 1 week by e-mail or mail. We value your feedback.  Domingo Friend, DO [facility], [place]

## 2023-06-04 NOTE — Telephone Encounter (Signed)
 Requested Prescriptions  Pending Prescriptions Disp Refills   atorvastatin  (LIPITOR) 10 MG tablet [Pharmacy Med Name: Atorvastatin  Calcium  10 MG Oral Tablet] 90 tablet 1    Sig: Take 1 tablet by mouth once daily     Cardiovascular:  Antilipid - Statins Failed - 06/04/2023  2:31 PM      Failed - Lipid Panel in normal range within the last 12 months    Cholesterol, Total  Date Value Ref Range Status  06/30/2015 194 100 - 199 mg/dL Final   Cholesterol  Date Value Ref Range Status  03/19/2023 232 (H) <200 mg/dL Final   LDL Cholesterol (Calc)  Date Value Ref Range Status  03/19/2023 132 (H) mg/dL (calc) Final    Comment:    Reference range: <100 . Desirable range <100 mg/dL for primary prevention;   <70 mg/dL for patients with CHD or diabetic patients  with > or = 2 CHD risk factors. Aaron Aas LDL-C is now calculated using the Martin-Hopkins  calculation, which is a validated novel method providing  better accuracy than the Friedewald equation in the  estimation of LDL-C.  Melinda Sprawls et al. Erroll Heard. 9147;829(56): 2061-2068  (http://education.[employer].com/faq/FAQ164)    Direct LDL  Date Value Ref Range Status  08/27/2019 144.0 mg/dL Final    Comment:    Optimal:  <100 mg/dLNear or Above Optimal:  100-129 mg/dLBorderline High:  130-159 mg/dLHigh:  160-189 mg/dLVery High:  >190 mg/dL   HDL  Date Value Ref Range Status  03/19/2023 69 > OR = 50 mg/dL Final  21/30/8657 57 >84 mg/dL Final   Triglycerides  Date Value Ref Range Status  03/19/2023 176 (H) <150 mg/dL Final         Passed - Patient is not pregnant      Passed - Valid encounter within last 12 months    Recent Outpatient Visits           Yesterday Strep pharyngitis   [facility] Raina Bunting, DO   2 months ago Encounter for general adult medical examination with abnormal findings   [facility] [place], Rankin Buzzard, NP       Future Appointments              In 3 months Baity, Rankin Buzzard, NP [facility], [facility]

## 2023-06-05 NOTE — Telephone Encounter (Signed)
 Duplicate request, sent to [facility] 06/03/23.  Requested Prescriptions  Pending Prescriptions Disp Refills   amoxicillin (AMOXIL) 500 MG capsule 20 capsule 0    Sig: Take 1 capsule (500 mg total) by mouth 2 (two) times daily. For 10 days     Off-Protocol Failed - 06/05/2023 10:34 AM      Failed - Medication not assigned to a protocol, review manually.      Passed - Valid encounter within last 12 months    Recent Outpatient Visits           2 days ago Strep pharyngitis   [facility] [place], Kayleen Party, DO   2 months ago Encounter for general adult medical examination with abnormal findings   [facility] [place], Rankin Buzzard, NP       Future Appointments             In 3 months Baity, Rankin Buzzard, NP [place] [facility], [facility]

## 2023-06-11 ENCOUNTER — Telehealth: Payer: Self-pay | Admitting: Psychiatry

## 2023-07-04 ENCOUNTER — Encounter: Payer: Self-pay | Admitting: Psychiatry

## 2023-07-04 ENCOUNTER — Telehealth: Admitting: Psychiatry

## 2023-07-04 DIAGNOSIS — F410 Panic disorder [episodic paroxysmal anxiety] without agoraphobia: Secondary | ICD-10-CM | POA: Diagnosis not present

## 2023-07-04 DIAGNOSIS — G4701 Insomnia due to medical condition: Secondary | ICD-10-CM

## 2023-07-04 DIAGNOSIS — F17200 Nicotine dependence, unspecified, uncomplicated: Secondary | ICD-10-CM

## 2023-07-04 DIAGNOSIS — F17210 Nicotine dependence, cigarettes, uncomplicated: Secondary | ICD-10-CM | POA: Diagnosis not present

## 2023-07-04 DIAGNOSIS — F3176 Bipolar disorder, in full remission, most recent episode depressed: Secondary | ICD-10-CM

## 2023-07-04 NOTE — Progress Notes (Signed)
 Virtual Visit via Video Note  I connected with Jill Sandoval on 07/04/23 at  4:40 PM EDT by a video enabled telemedicine application and verified that I am speaking with the correct person using two identifiers.  Location Provider Location : [facility] Patient Location : Home  Participants: Patient , Provider   I discussed the limitations of evaluation and management by telemedicine and the availability of in person appointments. The patient expressed understanding and agreed to proceed.   I discussed the assessment and treatment plan with the patient. The patient was provided an opportunity to ask questions and all were answered. The patient agreed with the plan and demonstrated an understanding of the instructions.   The patient was advised to call back or seek an in-person evaluation if the symptoms worsen or if the condition fails to improve as anticipated.  [facility] MD OP Progress Note  07/04/2023 4:53 PM Jill Sandoval  MRN:  [id number]  Chief Complaint:  Chief Complaint  Patient presents with   Follow-up   Anxiety   Depression   Medication Refill   Discussed the use of AI scribe software for clinical note transcription with the patient, who gave verbal consent to proceed.  History of Present Illness Jill Sandoval is a 33 year old Caucasian female, married, currently lives in [place], has a history of bipolar disorder type II, panic disorder, insomnia, tobacco use disorder, was evaluated by telemedicine today.    Her mood has been stable with no changes or concerns since the last visit. She has not experienced any panic attacks and describes her overall condition as 'going really good.' No mood changes, panic attacks, or thoughts of self-harm or harm to others. She continues to take nortriptyline  75 mg at bedtime, oxcarbazepine  three tablets by mouth twice daily, and buspar  10 mg twice a day. She has not needed to use hydroxyzine , which was prescribed as needed, for over two  months.  She is working on quitting smoking and has reduced her use significantly, transitioning from smoking cigarettes to vaping. 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  She reports issues with snoring and was referred for a sleep study, but has not yet completed it due to cost concerns. Her sleep has been improving with the use of melatonin every other night, although she still experiences some issues around 2 AM.  Currently denies any suicidality, homicidality or perceptual disturbances.    Visit Diagnosis:    ICD-10-CM   1. Bipolar disorder, in full remission, most recent episode depressed (HCC)  F31.76     2. Panic disorder  F41.0     3. Insomnia due to medical condition  G47.01    Anxiety    4. Tobacco use disorder  F17.200       Past Psychiatric History: I have reviewed past psychiatric history from progress note on 10/15/2018.  Past trials of Celexa , Trileptal .  Past Medical History:  Past Medical History:  Diagnosis Date   Anxiety and depression    Asthma    inhaler prn   Fetal demise > 22 weeks, delivered, current hospitalization 11/04/2015   Miscarriage     Past Surgical History:  Procedure Laterality Date   CESAREAN SECTION N/A 03/24/2012   Procedure: CESAREAN SECTION;  Surgeon: Charlie JONETTA Aho, MD;  Location: [facility];  Service: Obstetrics;  Laterality: N/A;   CESAREAN SECTION N/A 09/24/2016   Procedure: CESAREAN SECTION;  Surgeon: Okey Leader, MD;  Location: [facility];  Service: Obstetrics;  Laterality: N/A;    Family Psychiatric History: I have reviewed family psychiatric history from progress note on 10/15/2018.  Family History:  Family History  Problem Relation Age of Onset   Healthy Mother    Hypertension Father    Hearing loss Brother    Hypertension Maternal Grandmother    Prostate cancer Maternal Grandfather    Hearing loss Paternal Grandfather    Other Neg Hx     Social History: I have  reviewed social history from progress note on 10/15/2018. Social History   Socioeconomic History   Marital status: Married    Spouse name: Not on file   Number of children: 2   Years of education: Not on file   Highest education level: GED or equivalent  Occupational History   Not on file  Tobacco Use   Smoking status: Former    Current packs/day: 0.00    Types: Cigarettes    Quit date: 11/14/2018    Years since quitting: 4.6    Passive exposure: Past   Smokeless tobacco: Never  Vaping Use   Vaping status: Some Days  Substance and Sexual Activity   Alcohol use: No   Drug use: No   Sexual activity: Yes    Birth control/protection: None  Other Topics Concern   Not on file  Social History Narrative   Married.   2 children.   Works at [employer].   Enjoys camping, being outdoors.    Social Drivers of Corporate Investment Banker Strain: Low Risk  (03/18/2023)   Overall Financial Resource Strain (CARDIA)    Difficulty of Paying Living Expenses: Not very hard  Food Insecurity: No Food Insecurity (03/18/2023)   Hunger Vital Sign    Worried About Running Out of Food in the Last Year: Never true    Ran Out of Food in the Last Year: Never true  Transportation Needs: No Transportation Needs (03/18/2023)   PRAPARE - Administrator, Civil Service (Medical): No    Lack of Transportation (Non-Medical): No  Physical Activity: Insufficiently Active (03/18/2023)   Exercise Vital Sign    Days of Exercise per Week: 4 days    Minutes of Exercise per Session: 10 min  Stress: No Stress Concern Present (03/18/2023)   [employer] of Occupational Health - Occupational Stress Questionnaire    Feeling of Stress : Not at all  Social Connections: Moderately Isolated (03/18/2023)   Social Connection and Isolation Panel [NHANES]    Frequency of Communication with Friends and Family: More than three times a week    Frequency of Social Gatherings with Friends and Family: More than three  times a week    Attends Religious Services: Never    Database Administrator or Organizations: No    Attends Engineer, Structural: Not on file    Marital Status: Married    Allergies: No Known Allergies  Metabolic Disorder Labs: Lab Results  Component Value Date   HGBA1C 5.1 03/19/2023   MPG 100 03/19/2023   MPG 105 03/13/2022   No results found for: PROLACTIN Lab Results  Component Value Date   CHOL 232 (H) 03/19/2023   TRIG 176 (H) 03/19/2023   HDL 69 03/19/2023   CHOLHDL 3.4 03/19/2023   VLDL 20 06/14/2020   LDLCALC 132 (H) 03/19/2023   LDLCALC 83 03/13/2022   Lab Results  Component Value Date   TSH 1.34 04/27/2020   TSH 1.42 07/08/2019  Therapeutic Level Labs: No results found for: LITHIUM No results found for: VALPROATE No results found for: CBMZ  Current Medications: Current Outpatient Medications  Medication Sig Dispense Refill   atorvastatin  (LIPITOR) 10 MG tablet Take 1 tablet by mouth once daily 90 tablet 1   busPIRone  (BUSPAR ) 10 MG tablet Take 1 tablet by mouth twice daily 180 tablet 0   hydrOXYzine  (VISTARIL ) 25 MG capsule Take 1-2 capsules (25-50 mg total) by mouth as directed. Take for severe panic attacks 60 capsule 1   nortriptyline  (PAMELOR ) 75 MG capsule Take 1 capsule by mouth at bedtime 90 capsule 1   Oxcarbazepine  (TRILEPTAL ) 300 MG tablet TAKE 3 TABLETS BY MOUTH TWICE DAILY 540 tablet 3   No current facility-administered medications for this visit.     Musculoskeletal: Strength & Muscle Tone: UTA Gait & Station: Seated Patient leans: N/A  Psychiatric Specialty Exam: Review of Systems  Psychiatric/Behavioral:  Positive for sleep disturbance.     There were no vitals taken for this visit.There is no height or weight on file to calculate BMI.  General Appearance: Casual  Eye Contact:  Fair  Speech:  Normal Rate  Volume:  Normal  Mood:  Euthymic  Affect:  Congruent  Thought Process:  Goal Directed and Descriptions  of Associations: Intact  Orientation:  Full (Time, Place, and Person)  Thought Content: Logical   Suicidal Thoughts:  No  Homicidal Thoughts:  No  Memory:  Immediate;   Fair Recent;   Fair Remote;   Fair  Judgement:  Fair  Insight:  Fair  Psychomotor Activity:  Normal  Concentration:  Concentration: Fair and Attention Span: Fair  Recall:  [employer] of Knowledge: Fair  Language: Fair  Akathisia:  No  Handed:  Right  AIMS (if indicated): not done  Assets:  Communication Skills Desire for Improvement Housing Social Support Talents/Skills  ADL's:  Intact  Cognition: WNL  Sleep:  Varies   Screenings: AIMS    Flowsheet Row Video Visit from 04/26/2022 in [facility] Video Visit from 11/15/2021 in [facility] Office Visit from 08/03/2021 in [facility] Video Visit from 05/19/2021 in [facility] Video Visit from 02/17/2021 in [facility]  AIMS Total Score 0 0 0 0 0      GAD-7    Flowsheet Row Office Visit from 06/03/2023 in [facility] Office Visit from 03/19/2023 in [facility] Office Visit from 12/11/2022 in [facility] Office Visit from 11/14/2022 in [facility] Office Visit from 09/20/2022 in [facility]  Total GAD-7 Score 3 1 0 2 0      PHQ2-9    Flowsheet Row Office Visit from 06/03/2023 in [facility] Office Visit from 03/19/2023 in [facility] Office Visit from 12/11/2022 in [facility] Office Visit from 11/14/2022 in [facility] Office Visit from 09/20/2022 in Jill [facility]oval  PHQ-2  Total Score 2 0 1 0 0  PHQ-9 Total Score 5 3 3 2  --      Flowsheet Row Video Visit from 07/04/2023 in [facility]  C-SSRS RISK CATEGORY No Risk No Risk No Risk        Assessment and Plan: BERNELL HAYNIE is a 33 year old Caucasian female, married, lives in [place], has a history of bipolar disorder, panic attacks was evaluated by telemedicine today.  Bipolar disorder type II in remission Currently overall mood symptoms stable on the current medication regimen. Continue Trileptal  900 mg twice daily  Panic disorder-stable Currently denies any recent panic attacks. Continue BuSpar  10 mg twice daily.  Long-term plan to taper off. Could start lowering Buspar  to one tablet daily and gradually stop. Continue Nortriptyline  75 mg daily at bedtime Continue Hydroxyzine  25 to 50 mg daily as needed  Insomnia-unstable Over sleep is good although does have snoring as well as fatigue some days. Continue Nortriptyline  75 mg at bedtime Continue Hydroxyzine  as needed Continue Melatonin 3 to 5 mg at bedtime as needed Could use Melatonin combination medications over-the-counter. Pending sleep study- encouraged to get it done  Follow-up in clinic in 5 to 6 months or sooner if needed.   Consent: Patient/Guardian gives verbal consent for treatment and assignment of benefits for services provided during this visit. Patient/Guardian expressed understanding and agreed to proceed.   This note was generated in part or whole with voice recognition software. Voice recognition is usually quite accurate but there are transcription errors that can and very often do occur. I apologize for any typographical errors that were not detected and corrected.    Jaxon Mynhier, MD 07/05/2023, 8:10 AM

## 2023-08-05 ENCOUNTER — Other Ambulatory Visit: Payer: Self-pay | Admitting: Psychiatry

## 2023-08-05 DIAGNOSIS — F410 Panic disorder [episodic paroxysmal anxiety] without agoraphobia: Secondary | ICD-10-CM

## 2023-08-30 DIAGNOSIS — F17200 Nicotine dependence, unspecified, uncomplicated: Secondary | ICD-10-CM

## 2023-08-30 MED ORDER — NICOTINE 7 MG/24HR TD PT24: 7.0000 mg | MEDICATED_PATCH | Freq: Every day | TRANSDERMAL | 0 refills | Status: DC

## 2023-09-17 ENCOUNTER — Ambulatory Visit: Payer: PRIVATE HEALTH INSURANCE | Admitting: Internal Medicine

## 2023-10-16 ENCOUNTER — Encounter: Payer: Self-pay | Admitting: Internal Medicine

## 2023-10-24 ENCOUNTER — Other Ambulatory Visit: Payer: Self-pay | Admitting: Psychiatry

## 2023-10-24 DIAGNOSIS — F3176 Bipolar disorder, in full remission, most recent episode depressed: Secondary | ICD-10-CM

## 2023-10-24 DIAGNOSIS — F410 Panic disorder [episodic paroxysmal anxiety] without agoraphobia: Secondary | ICD-10-CM

## 2023-11-24 ENCOUNTER — Other Ambulatory Visit: Payer: Self-pay | Admitting: Psychiatry

## 2023-11-24 DIAGNOSIS — F410 Panic disorder [episodic paroxysmal anxiety] without agoraphobia: Secondary | ICD-10-CM

## 2023-12-10 ENCOUNTER — Encounter: Payer: Self-pay | Admitting: Psychiatry

## 2023-12-10 ENCOUNTER — Other Ambulatory Visit: Payer: Self-pay

## 2023-12-10 ENCOUNTER — Ambulatory Visit (INDEPENDENT_AMBULATORY_CARE_PROVIDER_SITE_OTHER): Admitting: Psychiatry

## 2023-12-10 VITALS — BP 132/90 | HR 115 | Temp 97.8°F | Ht 62.0 in | Wt 270.4 lb

## 2023-12-10 DIAGNOSIS — R635 Abnormal weight gain: Secondary | ICD-10-CM | POA: Diagnosis not present

## 2023-12-10 DIAGNOSIS — F410 Panic disorder [episodic paroxysmal anxiety] without agoraphobia: Secondary | ICD-10-CM

## 2023-12-10 DIAGNOSIS — G4701 Insomnia due to medical condition: Secondary | ICD-10-CM | POA: Diagnosis not present

## 2023-12-10 DIAGNOSIS — F3176 Bipolar disorder, in full remission, most recent episode depressed: Secondary | ICD-10-CM | POA: Diagnosis not present

## 2023-12-10 MED ORDER — BUSPIRONE HCL 10 MG PO TABS: 15.0000 mg | ORAL_TABLET | Freq: Every day | ORAL | Status: AC

## 2023-12-10 MED ORDER — TOPIRAMATE 25 MG PO TABS: ORAL_TABLET | ORAL | 0 refills | Status: AC

## 2023-12-10 NOTE — Progress Notes (Signed)
 [facility] MD OP Progress Note  12/10/2023 2:09 PM Jill Sandoval  MRN:  [id number]  Chief Complaint:  Chief Complaint  Patient presents with   Follow-up   Anxiety   Medication Refill   Discussed the use of AI scribe software for clinical note transcription with the patient, who gave verbal consent to proceed.  History of Present Illness Jill Sandoval is a 33 year old Caucasian female, married, currently lives in [place], has a history of bipolar disorder type II, panic disorder, insomnia, tobacco use disorder was evaluated in office today for a follow-up appointment.  She reports recent onset of depressive symptoms, including low mood and decreased self-esteem, and expresses concern about her weight. She describes ongoing mood swings, primarily characterized by irritability and anger, and notes that minor conversations with her husband can trigger episodes of snapping. She has attempted to identify and avoid triggers and utilizes coping strategies such as walking away from stressful situations.  She continues to experience persistent anxiety, though she reports less severity than previously and has not experienced a panic attack in several months. She manages anxiety symptoms by using breathing techniques and distraction, and credits her husband with providing support during anxious episodes.  She reports improvement in sleep quality. She denies any thoughts about hurting herself or others.  Her current medication regimen includes oxcarbazepine  (Trileptal ) 900 mg twice daily, buspirone  (Buspar ) 10 mg twice daily, which she has tapered to 1.5 tablets daily after experiencing increased anxiety at a lower dose, and nortriptyline  75 mg daily. She states she has not needed hydroxyzine  25-50 mg recently and cannot recall the last time she took it. 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  She  reports concerns about her weight may have contributed to her mood symptoms. She describes efforts to address her weight, including dietary changes and increased physical activity, but feels stuck in her progress.     Visit Diagnosis:    ICD-10-CM   1. Bipolar disorder, in full remission, most recent episode depressed  F31.76 topiramate (TOPAMAX) 25 MG tablet   Type II    2. Panic disorder  F41.0 topiramate (TOPAMAX) 25 MG tablet    busPIRone  (BUSPAR ) 10 MG tablet    3. Insomnia due to medical condition  G47.01 topiramate (TOPAMAX) 25 MG tablet   mood disorder    4. Weight gain  R63.5 Amb Ref to Medical Weight Management      Past Psychiatric History: I have reviewed past psychiatric history from progress note on 10/15/2018.  Past trials of Celexa , Trileptal   Past Medical History:  Past Medical History:  Diagnosis Date   Anxiety and depression    Asthma    inhaler prn   Fetal demise > 22 weeks, delivered, current hospitalization 11/04/2015   Miscarriage     Past Surgical History:  Procedure Laterality Date   CESAREAN SECTION N/A 03/24/2012   Procedure: CESAREAN SECTION;  Surgeon: Charlie JONETTA Aho, MD;  Location: [facility];  Service: Obstetrics;  Laterality: N/A;   CESAREAN SECTION N/A 09/24/2016   Procedure: CESAREAN SECTION;  Surgeon: Okey Leader, MD;  Location: [facility];  Service: Obstetrics;  Laterality: N/A;    Family Psychiatric History: Reviewed family psychiatric history from progress note on 10/15/2018  Family History:  Family History  Problem Relation Age of Onset   Healthy Mother    Hypertension Father    Hearing loss Brother  Hypertension Maternal Grandmother    Prostate cancer Maternal Grandfather    Hearing loss Paternal Grandfather    Other Neg Hx     Social History: I have reviewed social history from progress note on 10/15/2018 Social History   Socioeconomic History   Marital status: Married    Spouse name: Not on file   Number of  children: 2   Years of education: Not on file   Highest education level: GED or equivalent  Occupational History   Not on file  Tobacco Use   Smoking status: Former    Current packs/day: 0.00    Types: Cigarettes    Quit date: 11/14/2018    Years since quitting: 5.0    Passive exposure: Past   Smokeless tobacco: Never  Vaping Use   Vaping status: Some Days  Substance and Sexual Activity   Alcohol use: No   Drug use: No   Sexual activity: Yes    Birth control/protection: None  Other Topics Concern   Not on file  Social History Narrative   Married.   2 children.   Works at [employer].   Enjoys camping, being outdoors.    Social Drivers of Corporate Investment Banker Strain: Low Risk  (03/18/2023)   Overall Financial Resource Strain (CARDIA)    Difficulty of Paying Living Expenses: Not very hard  Food Insecurity: No Food Insecurity (03/18/2023)   Hunger Vital Sign    Worried About Running Out of Food in the Last Year: Never true    Ran Out of Food in the Last Year: Never true  Transportation Needs: No Transportation Needs (03/18/2023)   PRAPARE - Administrator, Civil Service (Medical): No    Lack of Transportation (Non-Medical): No  Physical Activity: Insufficiently Active (03/18/2023)   Exercise Vital Sign    Days of Exercise per Week: 4 days    Minutes of Exercise per Session: 10 min  Stress: No Stress Concern Present (03/18/2023)   Jill Sandoval of Occupational Health - Occupational Stress Questionnaire    Feeling of Stress : Not at all  Social Connections: Moderately Isolated (03/18/2023)   Social Connection and Isolation Panel    Frequency of Communication with Friends and Family: More than three times a week    Frequency of Social Gatherings with Friends and Family: More than three times a week    Attends Religious Services: Never    Database Administrator or Organizations: No    Attends Engineer, Structural: Not on file    Marital Status:  Married    Allergies: No Known Allergies  Metabolic Disorder Labs: Lab Results  Component Value Date   HGBA1C 5.1 03/19/2023   MPG 100 03/19/2023   MPG 105 03/13/2022   No results found for: PROLACTIN Lab Results  Component Value Date   CHOL 232 (H) 03/19/2023   TRIG 176 (H) 03/19/2023   HDL 69 03/19/2023   CHOLHDL 3.4 03/19/2023   VLDL 20 06/14/2020   LDLCALC 132 (H) 03/19/2023   LDLCALC 83 03/13/2022   Lab Results  Component Value Date   TSH 1.34 04/27/2020   TSH 1.42 07/08/2019    Therapeutic Level Labs: No results found for: LITHIUM No results found for: VALPROATE No results found for: CBMZ  Current Medications: Current Outpatient Medications  Medication Sig Dispense Refill   topiramate (TOPAMAX) 25 MG tablet Take 1 tablet (25 mg total) by mouth daily for 15 days, THEN 1 tablet (25 mg total) 2 (two)  times daily for 15 days. 45 tablet 0   atorvastatin  (LIPITOR) 10 MG tablet Take 1 tablet by mouth once daily 90 tablet 1   busPIRone  (BUSPAR ) 10 MG tablet Take 1.5 tablets (15 mg total) by mouth daily.     hydrOXYzine  (VISTARIL ) 25 MG capsule Take 1-2 capsules (25-50 mg total) by mouth as directed. Take for severe panic attacks 60 capsule 1   nortriptyline  (PAMELOR ) 75 MG capsule Take 1 capsule by mouth at bedtime 90 capsule 3   Oxcarbazepine  (TRILEPTAL ) 300 MG tablet Take 3 tablets by mouth twice daily. 540 tablet 3   No current facility-administered medications for this visit.     Musculoskeletal: Strength & Muscle Tone: within normal limits Gait & Station: normal Patient leans: N/A  Psychiatric Specialty Exam: Review of Systems  Psychiatric/Behavioral:  The patient is nervous/anxious.        Irritable    Blood pressure (!) 132/90, pulse (!) 115, temperature 97.8 F (36.6 C), temperature source Temporal, height 5' 2 (1.575 m), weight 270 lb 6.4 oz (122.7 kg).Body mass index is 49.46 kg/m.  General Appearance: Fairly Groomed  Eye Contact:  Good   Speech:  Clear and Coherent  Volume:  Normal  Mood:  Anxious and Irritable  Affect:  Appropriate  Thought Process:  Goal Directed and Descriptions of Associations: Intact  Orientation:  Full (Time, Place, and Person)  Thought Content: Logical   Suicidal Thoughts:  No  Homicidal Thoughts:  No  Memory:  Immediate;   Fair Recent;   Fair Remote;   Fair  Judgement:  Fair  Insight:  Fair  Psychomotor Activity:  Normal  Concentration:  Concentration: Fair and Attention Span: Fair  Recall:  [employer] of Knowledge: Fair  Language: Fair  Akathisia:  No  Handed:  Right  AIMS (if indicated): not done  Assets:  Communication Skills Desire for Improvement Housing Intimacy Social Support Talents/Skills  ADL's:  Intact  Cognition: WNL  Sleep:  Fair   Screenings: AIMS    Flowsheet Row Video Visit from 04/26/2022 in [facility] Video Visit from 11/15/2021 in [facility] Office Visit from 08/03/2021 in [facility] Video Visit from 05/19/2021 in [facility] Video Visit from 02/17/2021 in [facility]  AIMS Total Score 0 0 0 0 0   GAD-7    Flowsheet Row Office Visit from 12/10/2023 in [facility] Office Visit from 06/03/2023 in [facility] Office Visit from 03/19/2023 in [facility] Office Visit from 12/11/2022 in [facility] Office Visit from 11/14/2022 in [facility]  Total GAD-7 Score 3 3 1  0 2   PHQ2-9    Flowsheet Row Office Visit from 12/10/2023 in [facility] Office Visit from 06/03/2023 in [facility] Office Visit from 03/19/2023 in [facility] Office Visit from 12/11/2022 in [facility] Office Visit from 11/14/2022 in [facility] [place]  PHQ-2 Total Score 2 2 0 1 0  PHQ-9 Total Score 5 5 3 3 2    Flowsheet Row Office Visit from 12/10/2023 in [facility] Video Visit from 07/04/2023 in [facility] Office Visit from 12/11/2022 in [facility]  C-SSRS RISK CATEGORY No Risk No Risk No Risk     Assessment and Plan: DEVINN VOSHELL is a 33 year old Caucasian female who presented for a follow-up appointment, discussed assessment and plan as noted below.  1. Bipolar disorder, in full remission, most recent episode depressed Reports irritability mostly related to anxiety and concerns about weight gain otherwise mood symptoms stable on the current medication regimen. Continue Trileptal  900 mg twice daily  2. Panic disorder-unstable Although denies any full-blown panic attacks does report anxiety/irritability.  Agreeable to trial of topiramate. Start Topamax 25 mg daily for 2 weeks and increase to 25 mg twice a day. Discussed drug to drug interaction with nortriptyline  including excessive sedation. Continue Nortriptyline  75 mg at bedtime Continue BuSpar  15 mg daily.  Plan to taper it off in the future.  Previous attempts to taper off did not work. Continue Hydroxyzine  25 to 50 mg at bedtime  3. Insomnia due to medical condition-stable Currently reports sleep is good. Continue Nortriptyline  and hydroxyzine  as prescribed Continue sleep hygiene techniques  4. Weight gain-likely multifactorial-unstable Ambulatory referral to medication assisted weight management clinic.  Follow-up Follow-up in clinic in 2 weeks or sooner if needed.    Collaboration of Care: Collaboration of Care: Other than referred for weight loss/medication assisted weight  management clinic.  Patient/Guardian was advised Release of Information must be obtained prior to any record release in order to collaborate their care with an outside provider. Patient/Guardian was advised if they have not already done so to contact the registration department to sign all necessary forms in order for us  to release information regarding their care.   Consent: Patient/Guardian gives verbal consent for treatment and assignment of benefits for services provided during this visit. Patient/Guardian expressed understanding and agreed to proceed.  This note was generated in part or whole with voice recognition software. Voice recognition is usually quite accurate but there are transcription errors that can and very often do occur. I apologize for any typographical errors that were not detected and corrected.     Jacub Waiters, MD 12/10/2023, 2:09 PM

## 2023-12-10 NOTE — Patient Instructions (Signed)
Topiramate Tablets What is this medication? TOPIRAMATE (toe PYRE a mate) prevents and controls seizures in people with epilepsy. It may also be used to prevent migraine headaches. It works by calming overactive nerves in your body. This medicine may be used for other purposes; ask your health care provider or pharmacist if you have questions. COMMON BRAND NAME(S): Topamax, Topiragen What should I tell my care team before I take this medication? They need to know if you have any of these conditions: Bleeding disorder Kidney disease Lung disease Suicidal thoughts, plans, or attempt by you or a family member An unusual or allergic reaction to topiramate, other medications, foods, dyes, or preservatives Pregnant or trying to get pregnant Breast-feeding How should I use this medication? Take this medication by mouth with water. Take it as directed on the prescription label at the same time every day. Do not cut, crush or chew this medicine. Swallow the tablets whole. You can take it with or without food. If it upsets your stomach, take it with food. Keep taking it unless your care team tells you to stop. A special MedGuide will be given to you by the pharmacist with each prescription and refill. Be sure to read this information carefully each time. Talk to your care team about the use of this medication in children. While it may be prescribed for children as young as 2 years for selected conditions, precautions do apply. Overdosage: If you think you have taken too much of this medicine contact a poison control center or emergency room at once. NOTE: This medicine is only for you. Do not share this medicine with others. What if I miss a dose? If you miss a dose, take it as soon as you can unless it is within 6 hours of the next dose. If it is within 6 hours of the next dose, skip the missed dose. Take the next dose at the normal time. Do not take double or extra doses. What may interact with this  medication? Acetazolamide Alcohol Antihistamines for allergy, cough, and cold Aspirin and aspirin-like medications Atropine Certain medications for anxiety or sleep Certain medications for bladder problems, such as oxybutynin, tolterodine Certain medications for depression, such as amitriptyline, fluoxetine, sertraline Certain medications for Parkinson disease, such as benztropine, trihexyphenidyl Certain medications for seizures, such as carbamazepine, lamotrigine, phenobarbital, phenytoin, primidone, valproic acid, zonisamide Certain medications for stomach problems, such as dicyclomine, hyoscyamine Certain medications for travel sickness, such as scopolamine Certain medications that treat or prevent blood clots, such as warfarin, enoxaparin, dalteparin, apixaban, dabigatran, rivaroxaban Digoxin Diltiazem Estrogen and progestin hormones General anesthetics, such as halothane, isoflurane, methoxyflurane, propofol Glyburide Hydrochlorothiazide Ipratropium Lithium Medications that relax muscles Metformin NSAIDs, medications for pain and inflammation, such as ibuprofen or naproxen Opioid medications for pain Phenothiazines, such as chlorpromazine, mesoridazine, prochlorperazine, thioridazine Pioglitazone This list may not describe all possible interactions. Give your health care provider a list of all the medicines, herbs, non-prescription drugs, or dietary supplements you use. Also tell them if you smoke, drink alcohol, or use illegal drugs. Some items may interact with your medicine. What should I watch for while using this medication? Visit your care team for regular checks on your progress. Tell your care team if your symptoms do not start to get better or if they get worse. Do not suddenly stop taking this medication. You may develop a severe reaction. Your care team will tell you how much medication to take. If your care team wants you to stop the medication,  the dose may be slowly  lowered over time to avoid any side effects. Wear a medical ID bracelet or chain. Carry a card that describes your condition. List the medications and doses you take on the card. This medication may affect your coordination, reaction time, or judgment. Do not drive or operate machinery until you know how this medication affects you. Sit up or stand slowly to reduce the risk of dizzy or fainting spells. Drinking alcohol with this medication can increase the risk of these side effects. This medication may cause serious skin reactions. They can happen weeks to months after starting the medication. Contact your care team right away if you notice fevers or flu-like symptoms with a rash. The rash may be red or purple and then turn into blisters or peeling of the skin. You may also notice a red rash with swelling of the face, lips, or lymph nodes in your neck or under your arms. This medication may cause thoughts of suicide or depression. This includes sudden changes in mood, behaviors, or thoughts. These changes can happen at any time but are more common in the beginning of treatment or after a change in dose. Call your care team right away if you experience these thoughts or worsening depression. This medication may slow your child's growth if it is taken for a long time at high doses. Your child's care team will monitor your child's growth. Using this medication for a long time may weaken your bones. The risk of bone fractures may be increased. Talk to your care team about your bone health. Discuss this medication with your care team if you may be pregnant. Serious birth defects can occur if you take this medication during pregnancy. There are benefits and risks to taking medications during pregnancy. Your care team can help you find the option that works for you. Contraception is recommended while taking this medication. Estrogen and progestin hormones may not work as well while you are taking this medication.  Your care team can help you find the option that works for you. Talk to your care team before breastfeeding. Changes to your treatment plan may be needed. What side effects may I notice from receiving this medication? Side effects that you should report to your care team as soon as possible: Allergic reactions--skin rash, itching, hives, swelling of the face, lips, tongue, or throat High acid level--trouble breathing, unusual weakness or fatigue, confusion, headache, fast or irregular heartbeat, nausea, vomiting High ammonia level--unusual weakness or fatigue, confusion, loss of appetite, nausea, vomiting, seizures Fever that does not go away, decrease in sweat Kidney stones--blood in the urine, pain or trouble passing urine, pain in the lower back or sides Redness, blistering, peeling or loosening of the skin, including inside the mouth Sudden eye pain or change in vision such as blurry vision, seeing halos around lights, vision loss Thoughts of suicide or self-harm, worsening mood, feelings of depression Side effects that usually do not require medical attention (report to your care team if they continue or are bothersome): Burning or tingling sensation in hands or feet Difficulty with paying attention, memory, or speech Dizziness Drowsiness Fatigue Loss of appetite with weight loss Slow or sluggish movements of the body This list may not describe all possible side effects. Call your doctor for medical advice about side effects. You may report side effects to FDA at 1-800-FDA-1088. Where should I keep my medication? Keep out of the reach of children and pets. Store between 15 and 30 degrees C (  59 and 86 degrees F). Protect from moisture. Keep the container tightly closed. Get rid of any unused medication after the expiration date. To get rid of medications that are no longer needed or have expired: Take the medication to a medication take-back program. Check with your pharmacy or law  enforcement to find a location. If you cannot return the medication, check the label or package insert to see if the medication should be thrown out in the garbage or flushed down the toilet. If you are not sure, ask your care team. If it is safe to put it in the trash, empty the medication out of the container. Mix the medication with cat litter, dirt, coffee grounds, or other unwanted substance. Seal the mixture in a bag or container. Put it in the trash. NOTE: This sheet is a summary. It may not cover all possible information. If you have questions about this medicine, talk to your doctor, pharmacist, or health care provider.  2024 Elsevier/Gold Standard (2021-06-08 00:00:00)

## 2023-12-16 ENCOUNTER — Encounter (INDEPENDENT_AMBULATORY_CARE_PROVIDER_SITE_OTHER): Payer: Self-pay

## 2023-12-25 ENCOUNTER — Telehealth (INDEPENDENT_AMBULATORY_CARE_PROVIDER_SITE_OTHER): Admitting: Psychiatry

## 2023-12-25 DIAGNOSIS — F3176 Bipolar disorder, in full remission, most recent episode depressed: Secondary | ICD-10-CM

## 2023-12-25 NOTE — Progress Notes (Signed)
 No response to call or text or video invite

## 2024-01-16 ENCOUNTER — Encounter: Payer: Self-pay | Admitting: Internal Medicine

## 2024-01-22 ENCOUNTER — Other Ambulatory Visit: Payer: Self-pay | Admitting: Psychiatry

## 2024-01-30 ENCOUNTER — Other Ambulatory Visit: Payer: Self-pay | Admitting: Internal Medicine

## 2024-01-31 NOTE — Telephone Encounter (Signed)
 Duplicate request  Requested Prescriptions  Pending Prescriptions Disp Refills   atorvastatin  (LIPITOR) 10 MG tablet [Pharmacy Med Name: Atorvastatin  Calcium  10mg  Tablet] 90 tablet 1    Sig: Take 1 tablet by mouth once daily.     Cardiovascular:  Antilipid - Statins Failed - 01/31/2024  1:28 PM      Failed - Lipid Panel in normal range within the last 12 months    Cholesterol, Total  Date Value Ref Range Status  06/30/2015 194 100 - 199 mg/dL Final   Cholesterol  Date Value Ref Range Status  03/19/2023 232 (H) <200 mg/dL Final   LDL Cholesterol (Calc)  Date Value Ref Range Status  03/19/2023 132 (H) mg/dL (calc) Final    Comment:    Reference range: <100 . Desirable range <100 mg/dL for primary prevention;   <70 mg/dL for patients with CHD or diabetic patients  with > or = 2 CHD risk factors. SABRA LDL-C is now calculated using the Martin-Hopkins  calculation, which is a validated novel method providing  better accuracy than the Friedewald equation in the  estimation of LDL-C.  Gladis APPLETHWAITE et al. SANDREA. 7986;689(80): 2061-2068  (http://education.[employer].com/faq/FAQ164)    Direct LDL  Date Value Ref Range Status  08/27/2019 144.0 mg/dL Final    Comment:    Optimal:  <100 mg/dLNear or Above Optimal:  100-129 mg/dLBorderline High:  130-159 mg/dLHigh:  160-189 mg/dLVery High:  >190 mg/dL   HDL  Date Value Ref Range Status  03/19/2023 69 > OR = 50 mg/dL Final  93/98/7982 57 >60 mg/dL Final   Triglycerides  Date Value Ref Range Status  03/19/2023 176 (H) <150 mg/dL Final         Passed - Patient is not pregnant      Passed - Valid encounter within last 12 months    Recent Outpatient Visits           8 months ago Strep pharyngitis   [facility] [place] Marsa PARAS, DO   10 months ago Encounter for general adult medical examination with abnormal findings   [place] [facility] [place], Angeline ORN, NP

## 2024-02-02 ENCOUNTER — Other Ambulatory Visit: Payer: Self-pay | Admitting: Internal Medicine

## 2024-02-04 NOTE — Telephone Encounter (Signed)
 Requested Prescriptions  Pending Prescriptions Disp Refills   atorvastatin  (LIPITOR) 10 MG tablet [Pharmacy Med Name: Atorvastatin  Calcium  10mg  Tablet] 90 tablet 1    Sig: Take 1 tablet by mouth once daily.     Cardiovascular:  Antilipid - Statins Failed - 02/04/2024 11:43 AM      Failed - Lipid Panel in normal range within the last 12 months    Cholesterol, Total  Date Value Ref Range Status  06/30/2015 194 100 - 199 mg/dL Final   Cholesterol  Date Value Ref Range Status  03/19/2023 232 (H) <200 mg/dL Final   LDL Cholesterol (Calc)  Date Value Ref Range Status  03/19/2023 132 (H) mg/dL (calc) Final    Comment:    Reference range: <100 . Desirable range <100 mg/dL for primary prevention;   <70 mg/dL for patients with CHD or diabetic patients  with > or = 2 CHD risk factors. SABRA LDL-C is now calculated using the Martin-Hopkins  calculation, which is a validated novel method providing  better accuracy than the Friedewald equation in the  estimation of LDL-C.  Gladis APPLETHWAITE et al. SANDREA. 7986;689(80): 2061-2068  (http://education.[facility].com/faq/FAQ164)    Direct LDL  Date Value Ref Range Status  08/27/2019 144.0 mg/dL Final    Comment:    Optimal:  <100 mg/dLNear or Above Optimal:  100-129 mg/dLBorderline High:  130-159 mg/dLHigh:  160-189 mg/dLVery High:  >190 mg/dL   HDL  Date Value Ref Range Status  03/19/2023 69 > OR = 50 mg/dL Final  93/98/7982 57 >60 mg/dL Final   Triglycerides  Date Value Ref Range Status  03/19/2023 176 (H) <150 mg/dL Final         Passed - Patient is not pregnant      Passed - Valid encounter within last 12 months    Recent Outpatient Visits           8 months ago Strep pharyngitis   [place] [facility] [place] Marsa PARAS, DO   10 months ago Encounter for general adult medical examination with abnormal findings   [facility] [place], Angeline ORN, NP
